# Patient Record
Sex: Female | Born: 1945 | Race: White | Hispanic: No | Marital: Married | Smoking: Former smoker
Health system: Southern US, Community
[De-identification: ages and names within clinical notes are randomized; demographics above are authoritative.]

## PROBLEM LIST (undated history)

## (undated) DIAGNOSIS — G4730 Sleep apnea, unspecified: Secondary | ICD-10-CM

## (undated) DIAGNOSIS — E785 Hyperlipidemia, unspecified: Secondary | ICD-10-CM

## (undated) DIAGNOSIS — E119 Type 2 diabetes mellitus without complications: Secondary | ICD-10-CM

## (undated) DIAGNOSIS — G20 Parkinson's disease: Secondary | ICD-10-CM

## (undated) DIAGNOSIS — F419 Anxiety disorder, unspecified: Secondary | ICD-10-CM

## (undated) DIAGNOSIS — N183 Chronic kidney disease, stage 3 unspecified: Secondary | ICD-10-CM

## (undated) DIAGNOSIS — I4819 Other persistent atrial fibrillation: Secondary | ICD-10-CM

## (undated) DIAGNOSIS — J449 Chronic obstructive pulmonary disease, unspecified: Secondary | ICD-10-CM

## (undated) DIAGNOSIS — I10 Essential (primary) hypertension: Secondary | ICD-10-CM

## (undated) DIAGNOSIS — I5032 Chronic diastolic (congestive) heart failure: Secondary | ICD-10-CM

## (undated) DIAGNOSIS — G20A1 Parkinson's disease without dyskinesia, without mention of fluctuations: Secondary | ICD-10-CM

## (undated) DIAGNOSIS — J189 Pneumonia, unspecified organism: Secondary | ICD-10-CM

## (undated) DIAGNOSIS — I509 Heart failure, unspecified: Secondary | ICD-10-CM

## (undated) HISTORY — PX: TUBAL LIGATION: SHX77

## (undated) HISTORY — DX: Essential (primary) hypertension: I10

## (undated) HISTORY — DX: Anxiety disorder, unspecified: F41.9

## (undated) HISTORY — DX: Hyperlipidemia, unspecified: E78.5

## (undated) HISTORY — PX: ABDOMINAL HYSTERECTOMY: SHX81

---

## 1997-12-28 ENCOUNTER — Encounter: Admission: RE | Admit: 1997-12-28 | Discharge: 1998-03-28 | Payer: Self-pay | Admitting: Internal Medicine

## 1999-05-03 ENCOUNTER — Encounter: Payer: Self-pay | Admitting: Internal Medicine

## 1999-05-03 ENCOUNTER — Encounter: Admission: RE | Admit: 1999-05-03 | Discharge: 1999-05-03 | Payer: Self-pay | Admitting: Internal Medicine

## 2000-05-07 ENCOUNTER — Encounter: Admission: RE | Admit: 2000-05-07 | Discharge: 2000-05-07 | Payer: Self-pay | Admitting: Internal Medicine

## 2000-05-07 ENCOUNTER — Encounter: Payer: Self-pay | Admitting: Internal Medicine

## 2000-08-10 ENCOUNTER — Encounter: Payer: Self-pay | Admitting: Internal Medicine

## 2000-08-10 ENCOUNTER — Encounter: Admission: RE | Admit: 2000-08-10 | Discharge: 2000-08-10 | Payer: Self-pay | Admitting: Internal Medicine

## 2000-09-28 ENCOUNTER — Inpatient Hospital Stay (HOSPITAL_COMMUNITY): Admission: EM | Admit: 2000-09-28 | Discharge: 2000-09-30 | Payer: Self-pay | Admitting: General Surgery

## 2000-09-28 ENCOUNTER — Encounter: Payer: Self-pay | Admitting: General Surgery

## 2000-09-28 ENCOUNTER — Encounter (INDEPENDENT_AMBULATORY_CARE_PROVIDER_SITE_OTHER): Payer: Self-pay | Admitting: *Deleted

## 2000-09-28 ENCOUNTER — Encounter: Admission: RE | Admit: 2000-09-28 | Discharge: 2000-09-28 | Payer: Self-pay | Admitting: *Deleted

## 2000-09-28 ENCOUNTER — Encounter: Payer: Self-pay | Admitting: Internal Medicine

## 2000-09-29 ENCOUNTER — Encounter: Payer: Self-pay | Admitting: General Surgery

## 2001-08-16 ENCOUNTER — Encounter: Admission: RE | Admit: 2001-08-16 | Discharge: 2001-08-16 | Payer: Self-pay | Admitting: Internal Medicine

## 2001-08-16 ENCOUNTER — Encounter: Payer: Self-pay | Admitting: Internal Medicine

## 2003-02-06 ENCOUNTER — Encounter: Admission: RE | Admit: 2003-02-06 | Discharge: 2003-02-06 | Payer: Self-pay | Admitting: Internal Medicine

## 2004-02-24 ENCOUNTER — Ambulatory Visit (HOSPITAL_COMMUNITY): Admission: RE | Admit: 2004-02-24 | Discharge: 2004-02-24 | Payer: Self-pay | Admitting: Internal Medicine

## 2004-02-26 ENCOUNTER — Ambulatory Visit (HOSPITAL_BASED_OUTPATIENT_CLINIC_OR_DEPARTMENT_OTHER): Admission: RE | Admit: 2004-02-26 | Discharge: 2004-02-26 | Payer: Self-pay | Admitting: Internal Medicine

## 2004-04-15 ENCOUNTER — Ambulatory Visit (HOSPITAL_BASED_OUTPATIENT_CLINIC_OR_DEPARTMENT_OTHER): Admission: RE | Admit: 2004-04-15 | Discharge: 2004-04-15 | Payer: Self-pay | Admitting: Internal Medicine

## 2006-08-20 ENCOUNTER — Ambulatory Visit (HOSPITAL_COMMUNITY): Admission: RE | Admit: 2006-08-20 | Discharge: 2006-08-20 | Payer: Self-pay | Admitting: Internal Medicine

## 2007-11-06 ENCOUNTER — Ambulatory Visit (HOSPITAL_COMMUNITY): Admission: RE | Admit: 2007-11-06 | Discharge: 2007-11-06 | Payer: Self-pay | Admitting: Family Medicine

## 2008-11-09 ENCOUNTER — Ambulatory Visit (HOSPITAL_COMMUNITY): Admission: RE | Admit: 2008-11-09 | Discharge: 2008-11-09 | Payer: Self-pay | Admitting: Family Medicine

## 2010-03-30 ENCOUNTER — Ambulatory Visit (HOSPITAL_COMMUNITY)
Admission: RE | Admit: 2010-03-30 | Discharge: 2010-03-30 | Payer: Self-pay | Source: Home / Self Care | Attending: Family Medicine | Admitting: Family Medicine

## 2010-04-06 ENCOUNTER — Encounter
Admission: RE | Admit: 2010-04-06 | Discharge: 2010-04-06 | Payer: Self-pay | Source: Home / Self Care | Attending: Family Medicine | Admitting: Family Medicine

## 2010-04-10 ENCOUNTER — Encounter: Payer: Self-pay | Admitting: Family Medicine

## 2010-04-10 ENCOUNTER — Encounter: Payer: Self-pay | Admitting: Internal Medicine

## 2010-04-20 ENCOUNTER — Encounter: Payer: Self-pay | Admitting: Family Medicine

## 2010-08-05 NOTE — Op Note (Signed)
[facility]  Patient:    Melissa Powers, Melissa Powers                         MRN: [id number] Proc. Date: 09/29/00 Adm. Date:  [id number] Attending:  Arlis Porta CC:         Thora Lance, M.D.   Operative Report  PREOPERATIVE DIAGNOSIS:  Acute cholecystitis.  POSTOPERATIVE DIAGNOSIS:  Acute calculus cholecystitis.  PROCEDURE:  Laparoscopic cholecystectomy with intraoperative cholangiogram.  SURGEON:  Adolph Pollack, M.D.  ASSISTANT:  Anselm Pancoast. Zachery Dakins, M.D.  ANESTHESIA:  General.  INDICATIONS AND FINDINGS:  This is a 65 year old female, who has had a three day history of progressively increasing right upper quadrant pain with fever. Ultrasound demonstrated multiple gallstones.  Although her white blood cell count and liver functions were normal, her history and exam were suspicious for acute cholecystitis.  During the operation, she was found to have early acute inflammatory changes as well as edematous gallbladder wall.  TECHNIQUE:  She was placed supine on the operating table, and a general anesthetic was administered.  The abdomen was sterilely prepped and draped. Marcaine 0.5% plain was infiltrated in the subumbilical region, and a subumbilical incision made in the skin and subcutaneous tissue down to the fascia.  A 1 cm incision was made in the midline fascia.  The peritoneum was grasped and incised sharply.  The peritoneal was then entered under direct vision.  A pursestring suture of 0 Vicryl was placed around the fascial edges. A Hasson trocar was introduced into the peritoneal cavity and pneumoperitoneum created by insufflation of C02 gas.  Next, the laparoscope was introduced, and I saw no evidence of bowel injury. The patient was then placed in the appropriate position, and an 11 mm trocar placed through a similar size incision in the epigastrium and two 5 mm trocars placed in the right mid abdomen.  The fundus of the gallbladder  was grasped, and I noted some early acute inflammatory changes as well as some adhesions between the omentum and the gallbladder which were taken down bluntly and with cautery.  We grasped the infundibulum and completely mobilized it.  There was an anterior branch of the cystic artery laying anterior to the cystic duct. This was clipped and divided.  The cystic duct was subsequently isolated at its junction with the gallbladder and clipped at the junction of the gallbladder.  An incision was made in the cystic duct.  A Cholangiocath catheter was passed through the anterior abdominal wall, and a cholangiogram was performed.  Under real-time fluoroscopy, contrast solution was injected into the cystic duct.  It quickly passed into the common bile duct and into the duodenum.  The common hepatic and right and left hepatic ducts were noted.  There were no obvious filling defects.  The final report is pending the radiologists interpretation.  The Cholangiocath catheter was then removed.  The cystic duct was clipped three times proximally and divided.  A posterior branch of the cystic artery was identified, clipped, and divided.  The gallbladder was then dissected free from the liver bed intact with the cautery.  Bleeding points were controlled with the cautery.  The perihepatic areas were then copiously irrigated.  The gallbladder was removed from the subumbilical port.  There was some leakage of bile from the gallbladder in the subumbilical region, and this was irrigated and evacuated.  Under direct vision, I next closed the subumbilical fascial defect by tightening up  and tying down the pursestring suture.  There was still some leakage from the peritoneal edge, so I added an extra 0 Vicryl suture placed by way of an endoclose device.  This allowed for closure of the peritoneum and hemostasis.  The rest of the irrigation fluid was evacuated, and then all of the trocars were removed, and  the pneumoperitoneum was released.  The skin was closed with 4-0 Monocryl subcuticular stitches followed by Steri-Strips and sterile dressings.  She tolerated the procedure well without any apparent complications and was taken to the recovery room in satisfactory condition. DD:  09/29/00 TD:  09/29/00 Job: [id number]

## 2010-08-05 NOTE — H&P (Signed)
[facility]  Patient:    Melissa Powers, Melissa Powers                         MRN: [id number] Adm. Date:  [id number] Attending:  Arlis Porta CC:         Thora Lance, M.D.   History and Physical  REASON FOR VISIT:  Right upper quadrant abdominal pain, fever, gallstones.  HISTORY OF PRESENT ILLNESS:  Melissa Powers is a 65 year old female with type 2 diabetes who on Tuesday night began having some right upper quadrant pain she described as a catch that was fairly significant. On Wednesday she began vomiting. The pain has been relatively constant progressively worsening and made worse especially by meals. She had eaten some sausage this morning and had acute exacerbation of the pain leaving her to go to see Dr. Valentina Lucks. While there she had some liver function tests drawn which were normal and a complete blood cell count which was normal; however, she did undergo an ultrasound of her abdomen which demonstrated multiple gallstones. The pain has been fairly persistent and severe. She has not had any jaundice or hepatitis. She has persistent nausea as well as the pain.  PAST MEDICAL HISTORY: 1. Type 2 diabetes mellitus. 2. Hypertension. 3. Obesity. 4. Depression.  PAST SURGICAL HISTORY: 1. Left breast biopsy x 2 in 1992 and 1995 both of which were benign. 2. Total abdominal hysterectomy without oophorectomy for dysfunctional    uterine bleeding.  ALLERGIES:  None reported.  MEDICATIONS:  Zestoretic 20/25 one q.d., glucophage 1000 mg b.i.d., Aleve 1 b.i.d. to t.i.d., aspirin 81 mg q.d. She used to take serzone but takes an antidepressant, the name of which she does not know, one a day.  SOCIAL HISTORY:  She is married. She rarely has an alcoholic beverage, denies smoking.  FAMILY HISTORY:  Positive for diabetes and hypertension throughout the family. She has a brother and sister both of whom have heart disease as well. She has a brother that has  gallbladder disease.  REVIEW OF SYSTEMS:  CARDIOVASCULAR:  She denies known heart disease. She did have some atypical chest pain and underwent a cardiac catheterization back in 1996 by Dr. Ty Hilts which was normal. PULMONARY:  No asthma, pneumonia or COPD. GI:  No peptic ulcer disease, hiatal hernia, reflux or diverticulitis. She has had some diarrhea since Tuesday. GU:  No kidney stones, dysuria, or hematuria. HEMATOLOGIC:  She denies blood transfusions although it has been reported in her history and physical note by Dr. Valentina Lucks that she had one back in 1980. No deep venous thrombosis or known bleeding disorders. MUSCULOSKELETAL:  She said she has bursitis and tendonitis in the right shoulder.  PHYSICAL EXAMINATION:  GENERAL:  An obese female in no acute distress. Pleasant and cooperative. She weighs approximately 222 pounds and is 5 feet 3 3/4 inches tall.  HEENT:  Eyes extraocular movements intact. No scleral icterus.  NECK:  Supple without palpable masses.  CARDIOVASCULAR:  Heart demonstrates regular rate and rhythm without murmur. She does have palpable pedal pulses.  RESPIRATORY:  Breath sounds equal and clear. Respirations unlabored.  ABDOMEN:  Soft. There is mild right upper quadrant tenderness to palpation but no Murphy sign at the present. No palpable masses. No organomegaly.  MUSCULOSKELETAL:  She has full range of motion of her extremities with no cyanosis or edema.  IMPRESSION:  Persistent right upper quadrant pain worsening with fever and nausea. After morphine, she is  feeling some better. Given the fact she has diabetes and has a long history of this pain as well as gallstones, I am concerned that she has acute cholecystitis.  PLAN:  Admission to the hospital and start on IV antibiotics. Will plan urgent laparoscopic cholecystectomy tomorrow morning. I did explain the procedure and the risks including but not limited to bleeding, infection, risks  of anesthesia, hepatic injury, bile leakage, common bile duct damage, and small intestinal injury. She and her husband seem to understand this and agree to proceed. We will cover her diabetes with sliding scale insulin and cover her hypertension with p.r.n. labetalol. DD:  09/28/00 TD:  09/29/00 Job: [id number]

## 2010-08-05 NOTE — Procedures (Signed)
Melissa Powers, Melissa Powers                  ACCOUNT NO.:  [id number]   MEDICAL RECORD NO.:  [id number]          PATIENT TYPE:  OUT   LOCATION:  SLEEP CENTER                 FACILITY:  [facility]   PHYSICIAN:  Clinton D. Maple Hudson, M.D. DATE OF BIRTH:  December 17, 1945   DATE OF STUDY:  02/26/2004                              NOCTURNAL POLYSOMNOGRAM   STUDY DATE:  February 26, 2004   REFERRING PHYSICIAN:  Dr. Kirby Funk   INDICATION FOR STUDY:  Hypersomnia with sleep apnea.   EPWORTH SLEEPINESS SCORE:  20/24   BODY MASS INDEX:  31   WEIGHT:  194 pounds   SLEEP ARCHITECTURE:  Short total sleep time 221 minutes with sleep  efficiency 49%.  Stage I was 29%, stage II 69%, stages III and IV 1%, REM  was absent.  Sleep latency was 66 minutes, awake after sleep onset 164  minutes, arousal index elevated at 76.8.   RESPIRATORY DATA:  RDI 37.5 obstructive events per hour which is consistent  with moderately severe obstructive sleep apnea/hypopnea syndrome.  This  included 20 obstructive apneas and 118 hypopneas.  Events were not  positional.  REM RDI N/A.  The technician could not utilize split-study  protocol for CPAP titration because the patient could not sustain sleep  sufficiently to enable the protocol.  Consider return for CPAP titration.   OXYGEN DATA:  Moderately loud snoring with oxygen desaturation to a nadir of  87%.  Mean oxygen saturation through the study was 95% on room air.   CARDIAC DATA:  Sinus rhythm with frequent PVCs.   MOVEMENT/PARASOMNIA:  A total of 29 limb jerks were recorded of which 9 were  associated with arousal or awakening for a periodic limb movement with  arousal index of 2.4/hr which is mildly increased.   IMPRESSION/RECOMMENDATION:  Moderately severe obstructive sleep  apnea/hypopnea syndrome, RDI 37.5/hr with desaturation to 87%.  She was  unable to maintain sleep to permit the split-study protocol.  Consider  return for continuous positive airway pressure titration  bringing a sleep  medication to assist with sleep consolidation.  Mild periodic limb movement  with arousal, 2.4/hr.                                                           Clinton D. Maple Hudson, M.D.  Diplomate, [employer] Board   CDY/MEDQ  D:  03/06/2004 10:16:28  T:  03/06/2004 21:29:56  Job:  [id number]

## 2010-08-05 NOTE — Procedures (Signed)
Melissa Powers, Melissa Powers                  ACCOUNT NO.:  [id number]   MEDICAL RECORD NO.:  [id number]          PATIENT TYPE:  OUT   LOCATION:  SLEEP CENTER                 FACILITY:  [facility]   PHYSICIAN:  Clinton D. Maple Hudson, M.D. DATE OF BIRTH:  05-21-1945   DATE OF STUDY:                              NOCTURNAL POLYSOMNOGRAM   INDICATIONS FOR STUDY:  Hypersomnia with sleep apnea. Epworth sleepiness  score 20/24, BMI 33, weight 200 pounds. Diagnostic NPSG February 26, 2004,  recorded RDI 37.5 per hour. CPAP titration was requested.   SLEEP ARCHITECTURE:  Total sleep time 393 minutes with sleep efficiency of  81%. Stage I was 6%, stage II was 65%, stages III and IV were 19%, REM was  9% of total sleep time. Latency to sleep onset 54 minutes. Latency to REM  247 minutes. Awake after sleep onset 30 minutes. Arousal index 71, which is  increased.  The patient took Ambien.   RESPIRATORY DATA:  CPAP titration protocol. CPAP was titrated to 18 CWP, RDI  14 per hour using a small Resmed Ultra Mirage nasal/oral mask. It appears  the technician ran out of time and better control might be available at 19  CWP if necessary.   OXYGEN DATA:  Snoring was eliminated at a CPAP of 10 CWP. Oxygen saturation  averaged 95% on room air.   CARDIAC DATA:  Normal sinus rhythm, sinus tachycardia, 101 to 102 per minute  with occasional PVCs.   MOVEMENT/PARASOMNIA:  A total of 337 limb jerks were recorded of which 274  were associated with arousal or awakening for a periodic limb movement with  arousal index of 41.8 per hour which is markedly increased.   IMPRESSION/RECOMMENDATIONS:  1.  CPAP titration to 18 CWP, respiratory disturbance index of 14 per hour      using a small Resmed Ultra Mirage nasal/oral mask with heated      humidifier. This controlled snoring, but left a few residual hypopneas.      If necessary pressure can be tried at 19 or 20 CWP, but suggest      initiation at home setting of 18 CWP.  2.   Periodic limb movement with arousal, 41.8 per hour. If this persists      after adjustment to CPAP at home, consider addition of clonazepam or      Requip if clinically appropriate.      CDY/MEDQ  D:  04/17/2004 13:21:37  T:  04/17/2004 20:24:56  Job:  [id number]

## 2011-05-10 ENCOUNTER — Other Ambulatory Visit (HOSPITAL_COMMUNITY): Payer: Self-pay | Admitting: Family Medicine

## 2011-05-10 DIAGNOSIS — Z1231 Encounter for screening mammogram for malignant neoplasm of breast: Secondary | ICD-10-CM

## 2011-05-19 ENCOUNTER — Ambulatory Visit (HOSPITAL_COMMUNITY)
Admission: RE | Admit: 2011-05-19 | Discharge: 2011-05-19 | Disposition: A | Discharge status: Home / Self Care | Payer: Medicare Other | Source: Ambulatory Visit | Attending: Family Medicine | Admitting: Family Medicine

## 2011-05-19 DIAGNOSIS — Z1231 Encounter for screening mammogram for malignant neoplasm of breast: Secondary | ICD-10-CM

## 2012-06-14 ENCOUNTER — Other Ambulatory Visit (HOSPITAL_COMMUNITY): Payer: Self-pay | Admitting: Family Medicine

## 2012-06-14 DIAGNOSIS — Z1231 Encounter for screening mammogram for malignant neoplasm of breast: Secondary | ICD-10-CM

## 2012-06-20 ENCOUNTER — Ambulatory Visit (HOSPITAL_COMMUNITY): Payer: Medicare Other

## 2012-07-19 ENCOUNTER — Ambulatory Visit (HOSPITAL_COMMUNITY)
Admission: RE | Admit: 2012-07-19 | Discharge: 2012-07-19 | Disposition: A | Discharge status: Home / Self Care | Payer: Medicare Other | Source: Ambulatory Visit | Attending: Family Medicine | Admitting: Family Medicine

## 2012-07-19 DIAGNOSIS — Z1231 Encounter for screening mammogram for malignant neoplasm of breast: Secondary | ICD-10-CM | POA: Insufficient documentation

## 2013-07-21 ENCOUNTER — Other Ambulatory Visit (HOSPITAL_COMMUNITY): Payer: Self-pay | Admitting: Family Medicine

## 2013-07-21 DIAGNOSIS — Z1231 Encounter for screening mammogram for malignant neoplasm of breast: Secondary | ICD-10-CM

## 2013-08-08 ENCOUNTER — Encounter (INDEPENDENT_AMBULATORY_CARE_PROVIDER_SITE_OTHER): Payer: Self-pay

## 2013-08-08 ENCOUNTER — Ambulatory Visit (HOSPITAL_COMMUNITY)
Admission: RE | Admit: 2013-08-08 | Discharge: 2013-08-08 | Disposition: A | Discharge status: Home / Self Care | Payer: Medicare Other | Source: Ambulatory Visit | Attending: Family Medicine | Admitting: Family Medicine

## 2013-08-08 DIAGNOSIS — Z1231 Encounter for screening mammogram for malignant neoplasm of breast: Secondary | ICD-10-CM

## 2014-03-26 ENCOUNTER — Encounter: Payer: Self-pay | Admitting: Neurology

## 2015-01-19 HISTORY — PX: DEEP BRAIN STIMULATOR PLACEMENT: SHX608

## 2015-08-05 ENCOUNTER — Other Ambulatory Visit (HOSPITAL_COMMUNITY): Payer: Self-pay | Admitting: *Deleted

## 2015-08-19 DIAGNOSIS — J189 Pneumonia, unspecified organism: Secondary | ICD-10-CM

## 2015-08-19 HISTORY — DX: Pneumonia, unspecified organism: J18.9

## 2015-10-02 DIAGNOSIS — E119 Type 2 diabetes mellitus without complications: Secondary | ICD-10-CM

## 2015-10-02 DIAGNOSIS — E875 Hyperkalemia: Secondary | ICD-10-CM

## 2015-10-02 DIAGNOSIS — J9611 Chronic respiratory failure with hypoxia: Secondary | ICD-10-CM

## 2015-10-02 DIAGNOSIS — I10 Essential (primary) hypertension: Secondary | ICD-10-CM

## 2015-10-02 DIAGNOSIS — F418 Other specified anxiety disorders: Secondary | ICD-10-CM

## 2015-10-02 DIAGNOSIS — I480 Paroxysmal atrial fibrillation: Secondary | ICD-10-CM

## 2015-10-02 DIAGNOSIS — N189 Chronic kidney disease, unspecified: Secondary | ICD-10-CM

## 2015-10-02 DIAGNOSIS — I4891 Unspecified atrial fibrillation: Secondary | ICD-10-CM

## 2015-10-02 DIAGNOSIS — Z7901 Long term (current) use of anticoagulants: Secondary | ICD-10-CM

## 2015-10-02 DIAGNOSIS — G20 Parkinson's disease: Secondary | ICD-10-CM

## 2015-10-03 DIAGNOSIS — Z7901 Long term (current) use of anticoagulants: Secondary | ICD-10-CM | POA: Diagnosis not present

## 2015-10-03 DIAGNOSIS — J9611 Chronic respiratory failure with hypoxia: Secondary | ICD-10-CM | POA: Diagnosis not present

## 2015-10-03 DIAGNOSIS — I4891 Unspecified atrial fibrillation: Secondary | ICD-10-CM | POA: Diagnosis not present

## 2015-10-03 DIAGNOSIS — N189 Chronic kidney disease, unspecified: Secondary | ICD-10-CM | POA: Diagnosis not present

## 2015-10-14 ENCOUNTER — Other Ambulatory Visit: Payer: Self-pay | Admitting: Internal Medicine

## 2015-10-19 ENCOUNTER — Encounter (HOSPITAL_COMMUNITY): Payer: Self-pay | Admitting: Emergency Medicine

## 2015-10-19 ENCOUNTER — Emergency Department (HOSPITAL_COMMUNITY): Payer: Medicare Other

## 2015-10-19 ENCOUNTER — Inpatient Hospital Stay (HOSPITAL_COMMUNITY): Payer: Medicare Other

## 2015-10-19 ENCOUNTER — Inpatient Hospital Stay (HOSPITAL_COMMUNITY)
Admission: EM | Admit: 2015-10-19 | Discharge: 2015-10-22 | DRG: 291 | Disposition: A | Discharge status: Skilled Nursing Facility | Payer: Medicare Other | Attending: Family Medicine | Admitting: Family Medicine

## 2015-10-19 DIAGNOSIS — I130 Hypertensive heart and chronic kidney disease with heart failure and stage 1 through stage 4 chronic kidney disease, or unspecified chronic kidney disease: Secondary | ICD-10-CM | POA: Diagnosis present

## 2015-10-19 DIAGNOSIS — M797 Fibromyalgia: Secondary | ICD-10-CM | POA: Diagnosis present

## 2015-10-19 DIAGNOSIS — F419 Anxiety disorder, unspecified: Secondary | ICD-10-CM | POA: Diagnosis present

## 2015-10-19 DIAGNOSIS — Z7901 Long term (current) use of anticoagulants: Secondary | ICD-10-CM

## 2015-10-19 DIAGNOSIS — R001 Bradycardia, unspecified: Secondary | ICD-10-CM | POA: Diagnosis not present

## 2015-10-19 DIAGNOSIS — E785 Hyperlipidemia, unspecified: Secondary | ICD-10-CM | POA: Diagnosis present

## 2015-10-19 DIAGNOSIS — E1140 Type 2 diabetes mellitus with diabetic neuropathy, unspecified: Secondary | ICD-10-CM | POA: Diagnosis present

## 2015-10-19 DIAGNOSIS — G4730 Sleep apnea, unspecified: Secondary | ICD-10-CM | POA: Diagnosis present

## 2015-10-19 DIAGNOSIS — E1122 Type 2 diabetes mellitus with diabetic chronic kidney disease: Secondary | ICD-10-CM | POA: Diagnosis present

## 2015-10-19 DIAGNOSIS — Z9981 Dependence on supplemental oxygen: Secondary | ICD-10-CM | POA: Diagnosis not present

## 2015-10-19 DIAGNOSIS — I5033 Acute on chronic diastolic (congestive) heart failure: Secondary | ICD-10-CM | POA: Diagnosis present

## 2015-10-19 DIAGNOSIS — N189 Chronic kidney disease, unspecified: Secondary | ICD-10-CM

## 2015-10-19 DIAGNOSIS — I447 Left bundle-branch block, unspecified: Secondary | ICD-10-CM | POA: Diagnosis present

## 2015-10-19 DIAGNOSIS — Z87891 Personal history of nicotine dependence: Secondary | ICD-10-CM | POA: Diagnosis not present

## 2015-10-19 DIAGNOSIS — N390 Urinary tract infection, site not specified: Secondary | ICD-10-CM | POA: Diagnosis present

## 2015-10-19 DIAGNOSIS — I10 Essential (primary) hypertension: Secondary | ICD-10-CM | POA: Diagnosis not present

## 2015-10-19 DIAGNOSIS — I509 Heart failure, unspecified: Secondary | ICD-10-CM

## 2015-10-19 DIAGNOSIS — J449 Chronic obstructive pulmonary disease, unspecified: Secondary | ICD-10-CM | POA: Diagnosis present

## 2015-10-19 DIAGNOSIS — G20 Parkinson's disease: Secondary | ICD-10-CM | POA: Diagnosis present

## 2015-10-19 DIAGNOSIS — R32 Unspecified urinary incontinence: Secondary | ICD-10-CM | POA: Diagnosis present

## 2015-10-19 DIAGNOSIS — N183 Chronic kidney disease, stage 3 (moderate): Secondary | ICD-10-CM | POA: Diagnosis present

## 2015-10-19 DIAGNOSIS — R0602 Shortness of breath: Secondary | ICD-10-CM | POA: Diagnosis present

## 2015-10-19 DIAGNOSIS — I480 Paroxysmal atrial fibrillation: Secondary | ICD-10-CM | POA: Diagnosis present

## 2015-10-19 DIAGNOSIS — J9621 Acute and chronic respiratory failure with hypoxia: Secondary | ICD-10-CM | POA: Diagnosis not present

## 2015-10-19 DIAGNOSIS — N179 Acute kidney failure, unspecified: Secondary | ICD-10-CM | POA: Diagnosis present

## 2015-10-19 DIAGNOSIS — J9620 Acute and chronic respiratory failure, unspecified whether with hypoxia or hypercapnia: Secondary | ICD-10-CM | POA: Diagnosis present

## 2015-10-19 DIAGNOSIS — W19XXXA Unspecified fall, initial encounter: Secondary | ICD-10-CM | POA: Diagnosis not present

## 2015-10-19 HISTORY — DX: Chronic kidney disease, stage 3 unspecified: N18.30

## 2015-10-19 HISTORY — DX: Sleep apnea, unspecified: G47.30

## 2015-10-19 HISTORY — DX: Heart failure, unspecified: I50.9

## 2015-10-19 HISTORY — DX: Chronic kidney disease, stage 3 (moderate): N18.3

## 2015-10-19 HISTORY — DX: Pneumonia, unspecified organism: J18.9

## 2015-10-19 HISTORY — DX: Type 2 diabetes mellitus without complications: E11.9

## 2015-10-19 HISTORY — DX: Chronic obstructive pulmonary disease, unspecified: J44.9

## 2015-10-19 LAB — GLUCOSE, CAPILLARY
Glucose-Capillary: 223 mg/dL — ABNORMAL HIGH (ref 65–99)
Glucose-Capillary: 170 mg/dL — ABNORMAL HIGH (ref 65–99)

## 2015-10-19 LAB — CBC WITH DIFFERENTIAL/PLATELET
BASOS ABS: 0 10*3/uL (ref 0.0–0.1)
BASOS PCT: 0 %
EOS ABS: 0 10*3/uL (ref 0.0–0.7)
Eosinophils Relative: 0 %
HCT: 29.4 % — ABNORMAL LOW (ref 36.0–46.0)
HEMOGLOBIN: 8.9 g/dL — AB (ref 12.0–15.0)
Lymphocytes Relative: 12 %
Lymphs Abs: 1 10*3/uL (ref 0.7–4.0)
MCH: 27.6 pg (ref 26.0–34.0)
MCHC: 30.3 g/dL (ref 30.0–36.0)
MCV: 91.3 fL (ref 78.0–100.0)
MONO ABS: 0.7 10*3/uL (ref 0.1–1.0)
MONOS PCT: 8 %
NEUTROS PCT: 80 %
Neutro Abs: 6.7 10*3/uL (ref 1.7–7.7)
Platelets: 236 10*3/uL (ref 150–400)
RBC: 3.22 MIL/uL — ABNORMAL LOW (ref 3.87–5.11)
RDW: 14.8 % (ref 11.5–15.5)
WBC: 8.5 10*3/uL (ref 4.0–10.5)

## 2015-10-19 LAB — COMPREHENSIVE METABOLIC PANEL
ALBUMIN: 3.9 g/dL (ref 3.5–5.0)
ALT: 13 U/L — ABNORMAL LOW (ref 14–54)
ANION GAP: 9 (ref 5–15)
AST: 21 U/L (ref 15–41)
Alkaline Phosphatase: 32 U/L — ABNORMAL LOW (ref 38–126)
BUN: 77 mg/dL — AB (ref 6–20)
CO2: 28 mmol/L (ref 22–32)
Calcium: 9.5 mg/dL (ref 8.9–10.3)
Chloride: 98 mmol/L — ABNORMAL LOW (ref 101–111)
Creatinine, Ser: 2.21 mg/dL — ABNORMAL HIGH (ref 0.44–1.00)
GFR calc Af Amer: 25 mL/min — ABNORMAL LOW (ref >60)
GFR calc non Af Amer: 21 mL/min — ABNORMAL LOW (ref >60)
GLUCOSE: 149 mg/dL — AB (ref 65–99)
POTASSIUM: 4.1 mmol/L (ref 3.5–5.1)
SODIUM: 135 mmol/L (ref 135–145)
Total Bilirubin: 0.8 mg/dL (ref 0.3–1.2)
Total Protein: 7.4 g/dL (ref 6.5–8.1)

## 2015-10-19 LAB — URINE MICROSCOPIC-ADD ON

## 2015-10-19 LAB — URINALYSIS, ROUTINE W REFLEX MICROSCOPIC
BILIRUBIN URINE: NEGATIVE
Glucose, UA: NEGATIVE mg/dL
HGB URINE DIPSTICK: NEGATIVE
KETONES UR: NEGATIVE mg/dL
Nitrite: POSITIVE — AB
Protein, ur: 30 mg/dL — AB
Specific Gravity, Urine: 1.014 (ref 1.005–1.030)
pH: 6 (ref 5.0–8.0)

## 2015-10-19 LAB — TSH: TSH: 2.157 u[IU]/mL (ref 0.350–4.500)

## 2015-10-19 LAB — I-STAT TROPONIN, ED: TROPONIN I, POC: 0.01 ng/mL (ref 0.00–0.08)

## 2015-10-19 LAB — BRAIN NATRIURETIC PEPTIDE: B NATRIURETIC PEPTIDE 5: 1057.9 pg/mL — AB (ref 0.0–100.0)

## 2015-10-19 MED ORDER — RIVAROXABAN 15 MG PO TABS
15.0000 mg | ORAL_TABLET | Freq: Every day | ORAL | Status: DC
  Administered 2015-10-19 – 2015-10-22 (×4): 15 mg via ORAL
  Filled 2015-10-19 (×4): qty 1

## 2015-10-19 MED ORDER — SODIUM CHLORIDE 0.9% FLUSH
3.0000 mL | Freq: Two times a day (BID) | INTRAVENOUS | Status: DC
  Administered 2015-10-19 – 2015-10-22 (×5): 3 mL via INTRAVENOUS

## 2015-10-19 MED ORDER — CITALOPRAM HYDROBROMIDE 20 MG PO TABS
20.0000 mg | ORAL_TABLET | Freq: Every day | ORAL | Status: DC
  Administered 2015-10-19 – 2015-10-22 (×4): 20 mg via ORAL
  Filled 2015-10-19 (×4): qty 1

## 2015-10-19 MED ORDER — IRBESARTAN 300 MG PO TABS
300.0000 mg | ORAL_TABLET | Freq: Every day | ORAL | Status: DC
  Administered 2015-10-19 – 2015-10-22 (×4): 300 mg via ORAL
  Filled 2015-10-19 (×4): qty 1

## 2015-10-19 MED ORDER — SODIUM CHLORIDE 0.9% FLUSH: 3.0000 mL | INTRAVENOUS | Status: DC | PRN

## 2015-10-19 MED ORDER — SODIUM CHLORIDE 0.9 % IV SOLN: 250.0000 mL | INTRAVENOUS | Status: DC | PRN

## 2015-10-19 MED ORDER — ATORVASTATIN CALCIUM 20 MG PO TABS
20.0000 mg | ORAL_TABLET | Freq: Every day | ORAL | Status: DC
  Administered 2015-10-19 – 2015-10-22 (×4): 20 mg via ORAL
  Filled 2015-10-19 (×4): qty 1

## 2015-10-19 MED ORDER — HYDRALAZINE HCL 25 MG PO TABS
25.0000 mg | ORAL_TABLET | Freq: Three times a day (TID) | ORAL | Status: DC
  Administered 2015-10-19 – 2015-10-20 (×2): 25 mg via ORAL
  Filled 2015-10-19 (×2): qty 1

## 2015-10-19 MED ORDER — INSULIN ASPART 100 UNIT/ML ~~LOC~~ SOLN
0.0000 [IU] | Freq: Three times a day (TID) | SUBCUTANEOUS | Status: DC
  Administered 2015-10-19: 3 [IU] via SUBCUTANEOUS
  Administered 2015-10-20: 2 [IU] via SUBCUTANEOUS
  Administered 2015-10-20 – 2015-10-21 (×4): 1 [IU] via SUBCUTANEOUS

## 2015-10-19 MED ORDER — CARBIDOPA-LEVODOPA 25-100 MG PO TABS
1.0000 | ORAL_TABLET | Freq: Three times a day (TID) | ORAL | Status: DC
  Administered 2015-10-19 – 2015-10-22 (×10): 1 via ORAL
  Filled 2015-10-19 (×10): qty 1

## 2015-10-19 MED ORDER — METOPROLOL SUCCINATE ER 25 MG PO TB24
25.0000 mg | ORAL_TABLET | Freq: Every day | ORAL | Status: DC
  Administered 2015-10-19: 25 mg via ORAL
  Filled 2015-10-19: qty 1

## 2015-10-19 MED ORDER — ROPINIROLE HCL 0.25 MG PO TABS
0.2500 mg | ORAL_TABLET | Freq: Three times a day (TID) | ORAL | Status: DC
  Administered 2015-10-19 – 2015-10-22 (×10): 0.25 mg via ORAL
  Filled 2015-10-19 (×11): qty 1

## 2015-10-19 MED ORDER — GABAPENTIN 300 MG PO CAPS
300.0000 mg | ORAL_CAPSULE | Freq: Three times a day (TID) | ORAL | Status: DC
  Administered 2015-10-19 – 2015-10-20 (×3): 300 mg via ORAL
  Filled 2015-10-19 (×3): qty 1

## 2015-10-19 MED ORDER — FUROSEMIDE 10 MG/ML IJ SOLN
40.0000 mg | Freq: Once | INTRAMUSCULAR | Status: AC
  Administered 2015-10-19: 40 mg via INTRAVENOUS
  Filled 2015-10-19: qty 4

## 2015-10-19 MED ORDER — ASPIRIN EC 81 MG PO TBEC
81.0000 mg | DELAYED_RELEASE_TABLET | Freq: Every day | ORAL | Status: DC
  Administered 2015-10-19 – 2015-10-22 (×4): 81 mg via ORAL
  Filled 2015-10-19 (×4): qty 1

## 2015-10-19 MED ORDER — IPRATROPIUM BROMIDE 0.02 % IN SOLN: 0.5000 mg | Freq: Four times a day (QID) | RESPIRATORY_TRACT | Status: DC | PRN

## 2015-10-19 MED ORDER — FUROSEMIDE 10 MG/ML IJ SOLN
40.0000 mg | Freq: Two times a day (BID) | INTRAMUSCULAR | Status: DC
  Administered 2015-10-19 – 2015-10-20 (×2): 40 mg via INTRAVENOUS
  Filled 2015-10-19 (×2): qty 4

## 2015-10-19 MED ORDER — SODIUM CHLORIDE 0.9% FLUSH
3.0000 mL | Freq: Two times a day (BID) | INTRAVENOUS | Status: DC
  Administered 2015-10-19 – 2015-10-22 (×5): 3 mL via INTRAVENOUS

## 2015-10-19 MED ORDER — AMIODARONE HCL 200 MG PO TABS: 200.0000 mg | ORAL_TABLET | Freq: Every day | ORAL | Status: DC

## 2015-10-19 MED ORDER — AMIODARONE HCL 200 MG PO TABS: 200.0000 mg | ORAL_TABLET | Freq: Two times a day (BID) | ORAL | Status: DC

## 2015-10-19 MED ORDER — INSULIN ASPART 100 UNIT/ML ~~LOC~~ SOLN: 0.0000 [IU] | Freq: Every day | SUBCUTANEOUS | Status: DC

## 2015-10-19 MED ORDER — AMLODIPINE BESYLATE 5 MG PO TABS
5.0000 mg | ORAL_TABLET | Freq: Every day | ORAL | Status: DC
  Administered 2015-10-19 – 2015-10-22 (×4): 5 mg via ORAL
  Filled 2015-10-19 (×4): qty 1

## 2015-10-19 MED ORDER — FENOFIBRATE 160 MG PO TABS
160.0000 mg | ORAL_TABLET | Freq: Every day | ORAL | Status: DC
  Administered 2015-10-20 – 2015-10-22 (×3): 160 mg via ORAL
  Filled 2015-10-19 (×4): qty 1

## 2015-10-19 MED ORDER — FLECAINIDE ACETATE 100 MG PO TABS
100.0000 mg | ORAL_TABLET | Freq: Two times a day (BID) | ORAL | Status: DC
  Administered 2015-10-19: 100 mg via ORAL
  Filled 2015-10-19 (×2): qty 1

## 2015-10-19 MED ORDER — DILTIAZEM HCL 30 MG PO TABS: 30.0000 mg | ORAL_TABLET | Freq: Four times a day (QID) | ORAL | Status: DC

## 2015-10-19 NOTE — ED Notes (Signed)
Lunch order placed per pts request after MD advised okay for patient to eat

## 2015-10-19 NOTE — H&P (Signed)
[facility] Admission History and Physical Service Pager: [phone]  Patient name: Melissa Powers record number: [id number] Date of birth: [birth year] Age: 70 y.o. Gender: female  Primary Care Provider: No primary care provider on file. Consultants: None Code Status: FULL  Chief Complaint: Shortness of breath  Assessment and Plan: Melissa Powers is a 70 y.o. female presenting with shortness of breath . PMH is significant for HFpEF, Afib, CKD Stage 3, T2DM, HTN, HLD, anxiety, Parkinson's disease, fibromyalgia.  Shortness of breath likely acute on chronic exacerbation. H/o of HFpEF, per care everywhere follows with Dr Melissa Powers with [facility]. On admit BNP 1057, CXR showing cardiac enlargement with +JVD, mild interstitial edema. Last echo in care everywhere 12/22/2014 EF 60-65% with mild concentric LV hypertrophy. Patient states on torsemide at home 40 mg in the AM, 20 mg at lunch time, last took yesterday. - Admit to inpatient, under [facility] - Daily weights, I/Os - Continue IV lasix 40mg  BID - echo  - am EKG - daily BMP - saline lock  Weakness s/p fall Likely d/t SOB vs deconditioning. Cervical and thoracic xrays neg for fracture. CT head no acute intracranial abnormality.  - PT/OT consulted, appreciate recommendations  Paroxysmal Afib at home on ASA81, metoprolol succinate, xarelto, and diltiazem prn if HR >110. At home used to be on amiodarone but per car everywhere note at [facility] recently changed to flecainide on 10/08/15 because not able to afford medication as outpatient - monitor on telemetry - continue ASA81, xarelto, metoprolol - hold diltiazem since currently rate controlled - restarted flecainide per care everywhere notes - start atrovent neb  CKD Stage 3 baseline in care everywhere creatinine 1.1-1.4, on admit creatinine 2.21 - Monitor BMP  T2DM with neuropathy per patient at home on metformin invokana,  gabapentin, sitagliptin. Last A1c in care everywhere 7 at unknown date - SSI - monitor CBGs - A1c pending - continue gabapentin  HTN at home on norvasc, diovan, hydralazine, metoprolol succinate, and diltiazem prn if HR >110 - Monitor BP - continue norvasc, hydralazine, and ARB  HLD at home on atorvastatin and fenofibrate - continue home meds  Anxiety at home on citalopram - continue home med  Parkinson's disease has deep brain stimulator. at home on sinemet, primidone, requip - continue home meds  Fibromyalgia at home on citalopram - continue home med  FEN/GI: SLIV, heart healthy carb modified diet Prophylaxis: on xarelto for Afib  Disposition: pending  History of Present Illness:  Melissa Powers is a 70 y.o. female presenting with shortness of breath. States started 2 weeks ago, usually can ambulate to bathroom w/walker but lately has been finding it difficult to ambulate even short distances and has had increased SOB with speech. Has noticed increased LE edema over the last 2 weeks as well. Has orthopnea at baseline. Usually on home O2 2L at nighttime but recently has needed during the day. States went to [place] 4 days ago for similar complaint, was given lasix and discharged. States her SOB has since worsened. Per EMS was satting in 57s on room air. Denies chest pain. Has minor cough, nonproductive. Denies recent illness, sick contacts, recent increase in salt intake.  States has also had increasing weakness for past 2 weeks, drastically worse over past 2-3 days. Has fallen 4 times over last day. States will be ambulating to the bathroom, will get short of breath and weak, and then will fall. On her last fall she  states she struck the back of her neck and upper back as well as the top of her head.  Review Of Systems: Per HPI otherwise the remainder of the systems were negative.    Past Medical History: Past Medical History:  Diagnosis Date  . Anxiety   . Atrial  fibrillation ([place])   . CHF (congestive heart failure) ([place])   . Chronic kidney disease   . Diabetes ([place])   . Hyperlipidemia   . Hypertension   . Tremor     Past Surgical History: History reviewed. No pertinent surgical history.  Social History: Social History  Substance Use Topics  . Smoking status: Never Smoker  . Smokeless tobacco: Never Used  . Alcohol use No   Additional social history: Lives at home with husband. Former smoker, quit 50 years ago, has 1 pack year smoking history. Denies EtOH, recreational drug use.  Please also refer to relevant sections of EMR.  Family History: Brother has CHF  Allergies and Medications: Allergies  Allergen Reactions  . Morphine And Related Other (See Comments)    Pt reports intolerance due to sedation from morphine   No current facility-administered medications on file prior to encounter.    Current Outpatient Prescriptions on File Prior to Encounter  Medication Sig Dispense Refill  . amiodarone (PACERONE) 200 MG tablet Take 200 mg by mouth daily.    Marland Kitchen amitriptyline (ELAVIL) 100 MG tablet Take 100 mg by mouth at bedtime.    Marland Kitchen amLODipine (NORVASC) 10 MG tablet Take 5 mg by mouth daily.    . busPIRone (BUSPAR) 15 MG tablet Take 15 mg by mouth 2 (two) times daily.    . canagliflozin (INVOKANA) 100 MG TABS tablet Take 100 mg by mouth daily.    . carbidopa-levodopa (SINEMET IR) 25-100 MG per tablet Take 1 tablet by mouth 3 (three) times daily.    . citalopram (CELEXA) 20 MG tablet Take 20 mg by mouth daily.    . digoxin (LANOXIN) 0.125 MG tablet Take by mouth daily.    Marland Kitchen doxazosin (CARDURA) 8 MG tablet Take 8 mg by mouth daily.    . fenofibrate 160 MG tablet Take 160 mg by mouth daily.    Marland Kitchen gabapentin (NEURONTIN) 100 MG capsule Take 100 mg by mouth 3 (three) times daily.    . hydrochlorothiazide (HYDRODIURIL) 25 MG tablet Take 25 mg by mouth daily.    . hydrOXYzine (ATARAX/VISTARIL) 25 MG tablet Take 25 mg by mouth 3 (three) times  daily as needed for anxiety or itching.     . metoprolol succinate (TOPROL-XL) 100 MG 24 hr tablet Take 100 mg by mouth daily. Take with or immediately following a meal.    . Milnacipran HCl (SAVELLA) 100 MG TABS tablet Take 100 mg by mouth 2 (two) times daily.    . primidone (MYSOLINE) 50 MG tablet Take 50 mg by mouth at bedtime.    . Rivaroxaban (XARELTO) 15 MG TABS tablet Take 15 mg by mouth daily with supper.    . sitaGLIPtin (JANUVIA) 100 MG tablet Take 100 mg by mouth daily.    . traZODone (DESYREL) 50 MG tablet Take 50 mg by mouth at bedtime.    . valsartan (DIOVAN) 320 MG tablet Take 320 mg by mouth daily.      Objective: BP 111/67   Pulse 66   Temp 98.6 F (37 C) (Oral)   Resp 17   SpO2 93%  Exam: General: Laying in bed with head propped up at 35  degrees, appears SOB with nasal cannula in, in NAD Eyes: EOMI, PERRL ENTM: MMM Neck: +JVD Cardiovascular: RRR, no murmurs appreciated. Trace edema to mid shin b/l Respiratory: b/l crackles at bases. No wheezes or rhonchi. Increased WOB at rest, with visible worsening with speech Abdomen: soft, nt, nd, +bs MSK: moving limbs spontaneously Skin: no rashes noted. Edema over cervical spine without bruising. Small bruises on upper extremities Psych: appropriate affect Neuro. AO x4 Cranial Nerves II - XII - II - Visual field intact  III, IV, VI - Extraocular movements intact. V - Facial sensation intact bilaterally. VII - Facial movement intact bilaterally. VIII - Hearing & vestibular intact bilaterally. X - Palate elevates symmetrically, no dysarthria. XI - Chin turning & shoulder shrug intact bilaterally. XII - Tongue protrusion intact.  Motor Strength - The patient's strength was 5/5 in all extremities and pronator drift was absent. Bulk was normal and fasciculations were absent.  Motor Tone - Muscle tone was assessed at the neck and appendages and was normal.  Sensory - Light touch, were symmetrical.   Coordination - The  patient had normal movements in the hands with no ataxia or dysmetria. Tremor was absent.  Gait and Station - deferred due to safety concerns.   Labs and Imaging: CBC BMET   Recent Labs Lab 10/19/15 1118  WBC 8.5  HGB 8.9*  HCT 29.4*  PLT 236    Recent Labs Lab 10/19/15 1118  NA 135  K 4.1  CL 98*  CO2 28  BUN 77*  CREATININE 2.21*  GLUCOSE 149*  CALCIUM 9.5     Troponin 0.01 x1  Dg Chest 2 View  Result Date: 10/19/2015 CLINICAL DATA:  Recent falls.  Head injury EXAM: CHEST  2 VIEW COMPARISON:  10/01/2015 FINDINGS: Cardiac enlargement with pulmonary vascular congestion. Prominent interstitial markings suggesting mild interstitial edema. No significant pleural effusion. Implanted generator device in the left chest with leads extending into the left neck unchanged from the prior study. Possible deep brain stimulator. IMPRESSION: Congestive heart failure with mild interstitial edema. Electronically Signed   By: Franchot Gallo M.D.   On: 10/19/2015 12:27   Dg Cervical Spine Complete  Result Date: 10/19/2015 CLINICAL DATA:  Fall. EXAM: CERVICAL SPINE - COMPLETE 4+ VIEW COMPARISON:  None. FINDINGS: Mild anterior slip at C6-7 likely due to facet degeneration. Disc degeneration and mild spurring at C5-6. Diffuse cervical facet degeneration. Mild foraminal encroachment bilaterally at C5-6 and C6-7 due to spurring. Negative for fracture.  Prevertebral soft tissues normal. IMPRESSION: Cervical disc and facet degeneration. Mild anterior slip at C6-7 felt to be related to facet degeneration. Negative for fracture. Electronically Signed   By: Franchot Gallo M.D.   On: 10/19/2015 12:29   Dg Thoracic Spine 2 View  Result Date: 10/19/2015 CLINICAL DATA:  Fall EXAM: THORACIC SPINE 2 VIEWS COMPARISON:  Lateral chest x-ray 08/12/2015 FINDINGS: Negative for thoracic fracture. Disc degeneration and anterior spurring in the thoracic spine. No bony lesion. Mild dextroscoliosis mid thoracic spine.  IMPRESSION: Thoracic degenerative change.  Negative for fracture. Electronically Signed   By: Franchot Gallo M.D.   On: 10/19/2015 12:31    Bufford Lope, DO 10/19/2015, 1:06 PM PGY-1, House Intern pager: [phone], text pages welcome  I have seen and examined the patient. I have read and agree with the above note. My changes are noted in blue.  Latham Kinzler A. Lincoln Brigham MD, [facility] Resident PGY-2 Pager [phone]

## 2015-10-19 NOTE — ED Notes (Signed)
PA Geiple at bedside.  

## 2015-10-19 NOTE — ED Provider Notes (Signed)
[facility] DEPT Provider Note   CSN: [id number] Arrival date & time: 10/19/15  1048  First Provider Contact:  None     History   Chief Complaint Chief Complaint  Patient presents with  . Shortness of Breath    HPI Melissa Powers is a 70 y.o. female.  Patient with history of congestive heart failure (was supposed to be on torseamide as of 6/17 per Care Everywhere), pneumonia, chronic kidney disease, diabetes, atrial fibrillation -- presents with complaint of progressive shortness of breath over the past 2 weeks. Patient has also had associated mild swelling in her legs, swelling in her abdomen and swelling in her bilateral arms. Patient states that she went to [facility] emergency department 4 days ago and was treated with Lasix and discharge. Symptoms continued to worsen over the weekend. Patient has had increasing orthopnea and exertional dyspnea. She is urinating normally. Not currently taking any Lasix. She has felt extremely weak over the past several days and has fallen at least 4 times. Last night she fell and struck the base of her neck and upper back on the toilet. She has also fallen and sustained bruises to her right upper arm, bilateral legs, and right ribs. EMS was called this morning and upon arrival patient had oxygen saturation of 80% on room air. Patient typically wears oxygen at night but not during the day. The onset of this condition was acute. Alleviating factors: none.       Past Medical History:  Diagnosis Date  . Anxiety   . Atrial fibrillation ([place])   . CHF (congestive heart failure) ([place])   . Chronic kidney disease   . Diabetes ([place])   . Hyperlipidemia   . Hypertension   . Tremor     There are no active problems to display for this patient.   History reviewed. No pertinent surgical history.  OB History    No data available       Home Medications    Prior to Admission medications   Medication Sig Start Date End Date Taking? Authorizing  Provider  amiodarone (PACERONE) 200 MG tablet Take 200 mg by mouth daily.    Historical Provider, MD  amitriptyline (ELAVIL) 100 MG tablet Take 100 mg by mouth at bedtime.    Historical Provider, MD  amLODipine (NORVASC) 10 MG tablet Take 5 mg by mouth daily.    Historical Provider, MD  busPIRone (BUSPAR) 15 MG tablet Take 15 mg by mouth 2 (two) times daily.    Historical Provider, MD  canagliflozin (INVOKANA) 100 MG TABS tablet Take 100 mg by mouth daily.    Historical Provider, MD  carbidopa-levodopa (SINEMET IR) 25-100 MG per tablet Take 1 tablet by mouth 3 (three) times daily.    Historical Provider, MD  citalopram (CELEXA) 20 MG tablet Take 20 mg by mouth daily.    Historical Provider, MD  digoxin (LANOXIN) 0.125 MG tablet Take by mouth daily.    Historical Provider, MD  doxazosin (CARDURA) 8 MG tablet Take 8 mg by mouth daily.    Historical Provider, MD  fenofibrate 160 MG tablet Take 160 mg by mouth daily.    Historical Provider, MD  gabapentin (NEURONTIN) 100 MG capsule Take 100 mg by mouth 3 (three) times daily.    Historical Provider, MD  hydrochlorothiazide (HYDRODIURIL) 25 MG tablet Take 25 mg by mouth daily.    Historical Provider, MD  hydrOXYzine (ATARAX/VISTARIL) 25 MG tablet Take 25 mg by mouth 3 (three) times daily as needed.  Historical Provider, MD  metoprolol succinate (TOPROL-XL) 100 MG 24 hr tablet Take 100 mg by mouth daily. Take with or immediately following a meal.    Historical Provider, MD  Milnacipran HCl (SAVELLA) 100 MG TABS tablet Take 100 mg by mouth 2 (two) times daily.    Historical Provider, MD  primidone (MYSOLINE) 50 MG tablet Take 50 mg by mouth at bedtime.    Historical Provider, MD  Rivaroxaban (XARELTO) 15 MG TABS tablet Take 15 mg by mouth daily with supper.    Historical Provider, MD  sitaGLIPtin (JANUVIA) 100 MG tablet Take 100 mg by mouth daily.    Historical Provider, MD  traZODone (DESYREL) 50 MG tablet Take 50 mg by mouth at bedtime.    Historical  Provider, MD  valsartan (DIOVAN) 320 MG tablet Take 320 mg by mouth daily.    Historical Provider, MD    Family History No family history on file.  Social History Social History  Substance Use Topics  . Smoking status: Never Smoker  . Smokeless tobacco: Never Used  . Alcohol use No     Allergies   Morphine and related   Review of Systems Review of Systems  Constitutional: Negative for diaphoresis and fever.  Eyes: Negative for redness.  Respiratory: Positive for shortness of breath. Negative for cough.   Cardiovascular: Positive for leg swelling. Negative for chest pain and palpitations.  Gastrointestinal: Negative for abdominal pain, nausea and vomiting.  Genitourinary: Negative for difficulty urinating, dysuria and frequency.  Musculoskeletal: Positive for myalgias and neck pain. Negative for back pain.  Skin: Negative for rash.  Neurological: Negative for syncope and light-headedness.  Psychiatric/Behavioral: The patient is not nervous/anxious.      Physical Exam Updated Vital Signs BP (!) 150/51 (BP Location: Right Arm)   Pulse 62   Temp 98.6 F (37 C) (Oral)   Resp 18   SpO2 97%   Physical Exam  Constitutional: She appears well-developed and well-nourished.  HENT:  Head: Normocephalic and atraumatic.  Mouth/Throat: Mucous membranes are normal. Mucous membranes are not dry.  Eyes: Conjunctivae are normal.  Neck: Trachea normal and normal range of motion. Neck supple. JVD present. Normal carotid pulses present. No muscular tenderness present. Carotid bruit is not present. No tracheal deviation present.  Cardiovascular: Normal rate, regular rhythm, S1 normal, S2 normal, normal heart sounds and intact distal pulses.  Exam reveals no decreased pulses.   No murmur heard. Pulmonary/Chest: Effort normal. No respiratory distress. She has no wheezes. She exhibits no tenderness.  Abdominal: Soft. Normal aorta and bowel sounds are normal. There is no tenderness. There  is no rebound and no guarding.  Musculoskeletal: Normal range of motion. She exhibits edema (1+ bilateral edema of the ankles.).  Neurological: She is alert.  Skin: Skin is warm and dry. She is not diaphoretic. No cyanosis. No pallor.  Psychiatric: She has a normal mood and affect.  Nursing note and vitals reviewed.    ED Treatments / Results  Labs (all labs ordered are listed, but only abnormal results are displayed) Labs Reviewed  CBC WITH DIFFERENTIAL/PLATELET - Abnormal; Notable for the following:       Result Value   RBC 3.22 (*)    Hemoglobin 8.9 (*)    HCT 29.4 (*)    All other components within normal limits  COMPREHENSIVE METABOLIC PANEL - Abnormal; Notable for the following:    Chloride 98 (*)    Glucose, Bld 149 (*)    BUN 77 (*)  Creatinine, Ser 2.21 (*)    ALT 13 (*)    Alkaline Phosphatase 32 (*)    GFR calc non Af Amer 21 (*)    GFR calc Af Amer 25 (*)    All other components within normal limits  BRAIN NATRIURETIC PEPTIDE - Abnormal; Notable for the following:    B Natriuretic Peptide 1,057.9 (*)    All other components within normal limits  URINALYSIS, ROUTINE W REFLEX MICROSCOPIC (NOT AT [facility])  Randolm Idol, ED    EKG  EKG Interpretation  Date/Time:  Tuesday October 19 2015 10:56:29 EDT Ventricular Rate:  63 PR Interval:    QRS Duration: 306 QT Interval:  451 QTC Calculation: 462 R Axis:   -65 Text Interpretation:  Sinus or ectopic atrial rhythm Prolonged PR interval Left bundle branch block Artifact in lead(s) II III aVR aVL aVF V1 V2 V3 V4 V5 V6 When compared to prior: New TWI in lateral leads Intraventricular conduction delay Baseline wander Confirmed by Ellender Hose MD, Lysbeth Galas [phone]) on 10/19/2015 11:13:15 AM       Radiology Dg Chest 2 View  Result Date: 10/19/2015 CLINICAL DATA:  Recent falls.  Head injury EXAM: CHEST  2 VIEW COMPARISON:  10/01/2015 FINDINGS: Cardiac enlargement with pulmonary vascular congestion. Prominent interstitial  markings suggesting mild interstitial edema. No significant pleural effusion. Implanted generator device in the left chest with leads extending into the left neck unchanged from the prior study. Possible deep brain stimulator. IMPRESSION: Congestive heart failure with mild interstitial edema. Electronically Signed   By: Franchot Gallo M.D.   On: 10/19/2015 12:27   Dg Cervical Spine Complete  Result Date: 10/19/2015 CLINICAL DATA:  Fall. EXAM: CERVICAL SPINE - COMPLETE 4+ VIEW COMPARISON:  None. FINDINGS: Mild anterior slip at C6-7 likely due to facet degeneration. Disc degeneration and mild spurring at C5-6. Diffuse cervical facet degeneration. Mild foraminal encroachment bilaterally at C5-6 and C6-7 due to spurring. Negative for fracture.  Prevertebral soft tissues normal. IMPRESSION: Cervical disc and facet degeneration. Mild anterior slip at C6-7 felt to be related to facet degeneration. Negative for fracture. Electronically Signed   By: Franchot Gallo M.D.   On: 10/19/2015 12:29   Dg Thoracic Spine 2 View  Result Date: 10/19/2015 CLINICAL DATA:  Fall EXAM: THORACIC SPINE 2 VIEWS COMPARISON:  Lateral chest x-ray 08/12/2015 FINDINGS: Negative for thoracic fracture. Disc degeneration and anterior spurring in the thoracic spine. No bony lesion. Mild dextroscoliosis mid thoracic spine. IMPRESSION: Thoracic degenerative change.  Negative for fracture. Electronically Signed   By: Franchot Gallo M.D.   On: 10/19/2015 12:31    Procedures Procedures (including critical care time)  Medications Ordered in ED Medications  furosemide (LASIX) injection 40 mg (40 mg Intravenous Given 10/19/15 1256)     Initial Impression / Assessment and Plan / ED Course  I have reviewed the triage vital signs and the nursing notes.  Pertinent labs & imaging results that were available during my care of the patient were reviewed by me and considered in my medical decision making (see chart for details).  Clinical Course    11:14 AM Patient seen and examined. Work-up initiated. Medications ordered.   Vital signs reviewed and are as follows: BP (!) 150/51 (BP Location: Right Arm)   Pulse 62   Temp 98.6 F (37 C) (Oral)   Resp 18   SpO2 97%   12:56 PM Results reviewed. Will admit for CHF exacerbation, acute on chronic kidney injury.   Final Clinical Impressions(s) / ED  Diagnoses   Final diagnoses:  Acute on chronic congestive heart failure, unspecified congestive heart failure type ([place])  Acute kidney injury superimposed on chronic kidney disease ([place])   Admit for CHF.   New Prescriptions New Prescriptions   No medications on file     Carlisle Cater, PA-C 10/19/15 1314    Duffy Bruce, MD 10/20/15 [phone]

## 2015-10-19 NOTE — ED Notes (Signed)
PA Geiple at bedside to discuss admission with patient.

## 2015-10-19 NOTE — ED Notes (Signed)
Placed patient into a gown and on the monitor 

## 2015-10-19 NOTE — ED Triage Notes (Signed)
Pt arrives via  ems for c/o sob and generalized edema x 2 weeks. Pt reports being seen at [facility] Friday and was given lasix there but no rx to go home with. Pt does wear oxygen at night. EMS reports pt's O2 sat was 80% on room air, came up to 94% on 6L [place]. Pt also reports having multiple falls last night which is abnormal for her. Pt a/ox4.

## 2015-10-19 NOTE — ED Notes (Signed)
Admitting physician at bedside

## 2015-10-19 NOTE — ED Notes (Signed)
Patient transported to X-ray 

## 2015-10-20 ENCOUNTER — Inpatient Hospital Stay (HOSPITAL_COMMUNITY): Payer: Medicare Other

## 2015-10-20 DIAGNOSIS — W19XXXD Unspecified fall, subsequent encounter: Secondary | ICD-10-CM

## 2015-10-20 DIAGNOSIS — I509 Heart failure, unspecified: Secondary | ICD-10-CM

## 2015-10-20 LAB — CBC
HEMATOCRIT: 26.9 % — AB (ref 36.0–46.0)
HEMOGLOBIN: 8.3 g/dL — AB (ref 12.0–15.0)
MCH: 27.6 pg (ref 26.0–34.0)
MCHC: 30.9 g/dL (ref 30.0–36.0)
MCV: 89.4 fL (ref 78.0–100.0)
Platelets: 231 10*3/uL (ref 150–400)
RBC: 3.01 MIL/uL — AB (ref 3.87–5.11)
RDW: 14.8 % (ref 11.5–15.5)
WBC: 9.6 10*3/uL (ref 4.0–10.5)

## 2015-10-20 LAB — ECHOCARDIOGRAM COMPLETE
Height: 63 in
Weight: 2950.4 oz

## 2015-10-20 LAB — GLUCOSE, CAPILLARY
GLUCOSE-CAPILLARY: 124 mg/dL — AB (ref 65–99)
GLUCOSE-CAPILLARY: 154 mg/dL — AB (ref 65–99)
Glucose-Capillary: 129 mg/dL — ABNORMAL HIGH (ref 65–99)
Glucose-Capillary: 183 mg/dL — ABNORMAL HIGH (ref 65–99)

## 2015-10-20 LAB — BASIC METABOLIC PANEL
ANION GAP: 12 (ref 5–15)
BUN: 68 mg/dL — ABNORMAL HIGH (ref 6–20)
CO2: 29 mmol/L (ref 22–32)
Calcium: 9.3 mg/dL (ref 8.9–10.3)
Chloride: 96 mmol/L — ABNORMAL LOW (ref 101–111)
Creatinine, Ser: 2.04 mg/dL — ABNORMAL HIGH (ref 0.44–1.00)
GFR calc non Af Amer: 24 mL/min — ABNORMAL LOW (ref >60)
GFR, EST AFRICAN AMERICAN: 27 mL/min — AB (ref >60)
GLUCOSE: 107 mg/dL — AB (ref 65–99)
POTASSIUM: 3.6 mmol/L (ref 3.5–5.1)
Sodium: 137 mmol/L (ref 135–145)

## 2015-10-20 MED ORDER — DEXTROSE 5 % IV SOLN
1.0000 g | INTRAVENOUS | Status: DC
  Administered 2015-10-20: 1 g via INTRAVENOUS
  Filled 2015-10-20 (×2): qty 10

## 2015-10-20 MED ORDER — GABAPENTIN 100 MG PO CAPS
100.0000 mg | ORAL_CAPSULE | Freq: Three times a day (TID) | ORAL | Status: DC
  Administered 2015-10-20 – 2015-10-22 (×7): 100 mg via ORAL
  Filled 2015-10-20 (×7): qty 1

## 2015-10-20 MED ORDER — FUROSEMIDE 10 MG/ML IJ SOLN
60.0000 mg | Freq: Two times a day (BID) | INTRAMUSCULAR | Status: DC
  Administered 2015-10-20 – 2015-10-22 (×4): 60 mg via INTRAVENOUS
  Filled 2015-10-20 (×4): qty 6

## 2015-10-20 MED ORDER — POTASSIUM CHLORIDE CRYS ER 20 MEQ PO TBCR
20.0000 meq | EXTENDED_RELEASE_TABLET | Freq: Two times a day (BID) | ORAL | Status: DC
  Administered 2015-10-20 – 2015-10-22 (×5): 20 meq via ORAL
  Filled 2015-10-20: qty 2
  Filled 2015-10-20 (×4): qty 1

## 2015-10-20 NOTE — Progress Notes (Signed)
Pt had a one spike of fever 100.1 last night, called Family medicine for tylenol order, but after consulting MD, decided to give tylenol only if temperature is more than 100.4, later at 0300 it drop down to 98.7, otherwise pt slept well at night, incontinent most of the time, will continue to monitor the patient.

## 2015-10-20 NOTE — Progress Notes (Signed)
Pulse by  Dinamap - 52 Pulse by manual- 56

## 2015-10-20 NOTE — Progress Notes (Signed)
Patient seen and examined.  Full consult note to follow.   Improving with IV alsix but still volume overloaded. Increase lasix to 60 bid. Will have echo today.   Place TED Hose.   Gradie Ohm,MD 11:36 AM

## 2015-10-20 NOTE — Evaluation (Signed)
Occupational Therapy Evaluation Patient Details Name: Melissa Powers MRN: [id number] DOB: 1946-01-25 Today's Date: 10/20/2015    History of Present Illness 70 yo female admitted with shortness of breath. States started 2 weeks ago, usually can ambulate to bathroom w/walker but lately has been finding it difficult to ambulate even short distances and has had increased SOB with speech. Has noticed increased LE edema over the last 2 weeks as well. Has orthopnea at baseline. Usually on home O2 2L at nighttime but recently has needed during the day. States went to [facility] 4 days ago for similar complaint, was given lasix and discharged. States her SOB has since worsened. Per EMS was satting in 47s on room air. Has fallen 4 times over last day PTA.   Clinical Impression   Pt presents to therapy with above.  Ambulated to bathroom with therapist with RW and min assist-min guard.  Pt with 1 LOB when approaching toilet able to regain balance using RW and grab bar/wall with min assist from therapist. Completed grooming tasks in standing at sink with supervision, pt reports family and hired caregiver that assist with self-care tasks and will not require follow up OT at this time.  Pt demonstrates decreased activity tolerance/endurance and will benefit from PT to address balance deficits, cardiorespiratory endurance, and overall mobility.      Follow Up Recommendations  No OT follow up    Equipment Recommendations  None recommended by OT    Recommendations for Other Services PT consult     Precautions / Restrictions Precautions Precautions: Fall      Mobility Bed Mobility Overal bed mobility: Modified Independent             General bed mobility comments: able to complete bed mobility without assist  Transfers Overall transfer level: Needs assistance Equipment used: Rolling walker (2 wheeled) Transfers: Sit to/from Stand Sit to Stand: Supervision (cues for hand placement)                   ADL Overall ADL's : At baseline;Needs assistance/impaired     Grooming: Wash/dry hands;Wash/dry face;Supervision/safety               Lower Body Dressing: Set up Lower Body Dressing Details (indicate cue type and reason): donned hospital socks with setup assist Toilet Transfer: Minimal assistance;Ambulation;RW;Comfort height toilet;Grab bars   Toileting- Clothing Manipulation and Hygiene: Min guard;Sit to/from stand       Functional mobility during ADLs: Min guard;Minimal assistance General ADL Comments: Pt required min assist-min guard with ambulation with RW.  One LOB when approaching toilet but able to correct with RW and grabbar/wall.       Vision Vision Assessment?: No apparent visual deficits   Perception     Praxis      Pertinent Vitals/Pain Pain Assessment: No/denies pain     Hand Dominance Right   Extremity/Trunk Assessment Upper Extremity Assessment Upper Extremity Assessment: Generalized weakness (grossly 3+/5, loose gross grasp)   Lower Extremity Assessment Lower Extremity Assessment: Defer to PT evaluation       Communication Communication Communication: No difficulties   Cognition Arousal/Alertness: Awake/alert Behavior During Therapy: WFL for tasks assessed/performed Overall Cognitive Status: Within Functional Limits for tasks assessed                                Home Living Family/patient expects to be discharged to:: Private residence Living Arrangements: Spouse/significant other Available Help  at Discharge: Family               Bathroom Shower/Tub: Tub/shower unit         Home Equipment: Environmental consultant - 2 wheels;Cane - single point;Shower seat          Prior Functioning/Environment Level of Independence: Needs assistance  Gait / Transfers Assistance Needed: Mod I with RW ADL's / Homemaking Assistance Needed: Had a hired aide 3x/week to assist with bathing and dressing.  Did not complete any  IADLs        OT Diagnosis: Generalized weakness   OT Problem List: Decreased activity tolerance;Impaired balance (sitting and/or standing);Cardiopulmonary status limiting activity;Increased edema   OT Treatment/Interventions:      OT Goals(Current goals can be found in the care plan section) Acute Rehab OT Goals Patient Stated Goal: to improve balance OT Goal Formulation: All assessment and education complete, DC therapy        End of Session Equipment Utilized During Treatment: Gait belt;Rolling walker;Oxygen Nurse Communication: Mobility status  Activity Tolerance: Patient tolerated treatment well;Patient limited by fatigue Patient left: in bed;with call bell/phone within reach   Time: 1319-1343 OT Time Calculation (min): 24 min Charges:  OT General Charges $OT Visit: 1 Procedure OT Evaluation $OT Eval Moderate Complexity: 1 Procedure OT Treatments $Self Care/Home Management : 8-22 mins G-CodesSimonne Come, [phone] 10/20/2015, 2:00 PM

## 2015-10-20 NOTE — Progress Notes (Signed)
  Echocardiogram 2D Echocardiogram has been performed.  Tresa Res 10/20/2015, 12:02 PM

## 2015-10-20 NOTE — Progress Notes (Signed)
PT Cancellation Note  Patient Details Name: Melissa Powers MRN: [id number] DOB: 1946/01/07   Cancelled Treatment:    Reason Eval/Treat Not Completed: Other (comment) (Attempted to evaluate and medical visit initiated).  Try later as time allows.   Ramond Dial 10/20/2015, 9:17 AM    Mee Hives, PT MS Acute Rehab Dept. Number: [place] [facility] [place]

## 2015-10-20 NOTE — Progress Notes (Signed)
Family Medicine Teaching Service Daily Progress Note Intern Pager: [phone]  Patient name: Melissa Powers record number: [id number] Date of birth: 15-Apr-1945 Age: 70 y.o. Gender: female  Primary Care Provider: Maris Berger, MD Consultants: none Code Status: FULL  Pt Overview and Major Events to Date:  8/1 Admitted for CHF exacerbation   Assessment and Plan: Melissa Powers is a 70 y.o. female presenting with shortness of breath . PMH is significant for HFpEF, Afib, CKD Stage 3, T2DM, HTN, HLD, anxiety, Parkinson's disease, fibromyalgia.  Acute on chronic respiratory failure 2/2 CHF exacerbation. H/o of HFpEF. On admit+JVD, BNP 1057, CXR showing cardiac enlargement and mild interstitial edema. Last echo in care everywhere 12/22/2014 EF 60-65% with mild concentric LV hypertrophy. At home on torsemide at home 40 mg in the AM, 20 mg at lunch time. Still 14lbs up from dry weight but down 5 lbs from admission and patient symptomatically feels better. - Daily weights, I/Os - continue atrovent neb - Continue monitoring on IV lasix 40 BID UOP only 548mL, difficult to assess d/t patient urinary incontinence at baseline, no foley for now.  - echo today - daily BMP - cardiology HF team following, appreciate recommendations  Bradycardia, new. HR since midnight low to mid 50s. Patient initially thought was on flecainide as per care everywhere notes/[facility] list. This am remembers that not actually on flecainide as outpatient, has been taking amiodarone. Possibly medication interaction of amiodarone with norvasc despite patient stable on regimen in the past. Unlikely new flecainide given is not a known medication side effect - holding metoprolol - appreciate cardiology recommendations  Weakness s/p fall Likely d/t SOB vs deconditioning. Cervical and thoracic xrays neg for fracture. CT head no acute intracranial abnormality.  - PT/OT consulted, appreciate recommendations  Paroxysmal Afib at  home on ASA81, metoprolol succinate, xarelto, and diltiazem prn if HR >110. At home on amiodarone but per car everywhere note at [facility] recently changed to flecainide on 10/08/15 because not able to afford medication as outpatient. This am patient states has been on amiodarone, never been on flecainide - monitor on telemetry - continue ASA81, xarelto - hold diltiazem since currently rate controlled.  - Hold metoprolol since bradycardic this am - appreciate cardiology recommendations regarding amiodarone vs flecainide in the setting of new bradycardia   CKD Stage 3 baseline in care everywhere creatinine 1.1-1.4, now 2.01 - Monitor BMP  T2DM with neuropathy per patient at home on metformin invokana, gabapentin, sitagliptin. Last A1c in care everywhere 7 at unknown date - SSI - monitor CBGs - A1c pending - continue gabapentin  HTN at home on norvasc, diovan, hydralazine, metoprolol succinate, and diltiazem prn HR >110. Now hypotensive 115/38 - Monitor BP - continue norvasc, ARB - DC hydralazine  HLD at home on atorvastatin and fenofibrate - continue home meds  Anxiety at home on citalopram - continue home med  Parkinson's disease has deep brain stimulator. at home on sinemet, primidone, requip - continue home meds  Fibromyalgia at home on citalopram - continue home med  FEN/GI: SLIV, heart healthy carb modified diet Prophylaxis: on xarelto for Afib  Disposition: pending  Subjective:  States feels SOB that is slightly improved from admission but still not back at her baseline. Has been urinating more than usual. States usually when hospitalized for CHF exacerbation has had foley in to monitor I/Os. No dysuria.  Objective: Temp:  [98.4 F (36.9 C)-100.1 F (37.8 C)] 99.4 F (37.4 C) (08/02 0448) Pulse Rate:  [52-78] 56 (  08/02 0617) Resp:  [16-24] 20 (08/02 0448) BP: (111-157)/(38-79) 115/38 (08/02 0448) SpO2:  [92 %-98 %] 96 % (08/02 0448) Weight:  [184 lb  6.4 oz (83.6 kg)-189 lb 14.4 oz (86.1 kg)] 184 lb 6.4 oz (83.6 kg) (08/02 0519) Physical Exam: General: Sitting up in bed, in NAD Cardiovascular: RRR, no murmurs appreciated. + JVD. Respiratory: CTAB. No wheezes or rhonchi. Increased WOB at rest, with visible worsening with speech Abdomen: soft, nt, nd, +bs Extremities:  Trace edema to mid shin b/l  Laboratory:  Recent Labs Lab 10/19/15 1118 10/20/15 0453  WBC 8.5 PENDING  HGB 8.9* 8.3*  HCT 29.4* 26.9*  PLT 236 231    Recent Labs Lab 10/19/15 1118 10/20/15 0453  NA 135 137  K 4.1 3.6  CL 98* 96*  CO2 28 29  BUN 77* 68*  CREATININE 2.21* 2.04*  CALCIUM 9.5 9.3  PROT 7.4  --   BILITOT 0.8  --   ALKPHOS 32*  --   ALT 13*  --   AST 21  --   GLUCOSE 149* 107*   TSH 2.157   Imaging/Diagnostic Tests: Dg Chest 2 View  Result Date: 10/19/2015 CLINICAL DATA:  Recent falls.  Head injury EXAM: CHEST  2 VIEW COMPARISON:  10/01/2015 FINDINGS: Cardiac enlargement with pulmonary vascular congestion. Prominent interstitial markings suggesting mild interstitial edema. No significant pleural effusion. Implanted generator device in the left chest with leads extending into the left neck unchanged from the prior study. Possible deep brain stimulator. IMPRESSION: Congestive heart failure with mild interstitial edema. Electronically Signed   By: Franchot Gallo M.D.   On: 10/19/2015 12:27   Dg Cervical Spine Complete  Result Date: 10/19/2015 CLINICAL DATA:  Fall. EXAM: CERVICAL SPINE - COMPLETE 4+ VIEW COMPARISON:  None. FINDINGS: Mild anterior slip at C6-7 likely due to facet degeneration. Disc degeneration and mild spurring at C5-6. Diffuse cervical facet degeneration. Mild foraminal encroachment bilaterally at C5-6 and C6-7 due to spurring. Negative for fracture.  Prevertebral soft tissues normal. IMPRESSION: Cervical disc and facet degeneration. Mild anterior slip at C6-7 felt to be related to facet degeneration. Negative for fracture.  Electronically Signed   By: Franchot Gallo M.D.   On: 10/19/2015 12:29   Dg Thoracic Spine 2 View  Result Date: 10/19/2015 CLINICAL DATA:  Fall EXAM: THORACIC SPINE 2 VIEWS COMPARISON:  Lateral chest x-ray 08/12/2015 FINDINGS: Negative for thoracic fracture. Disc degeneration and anterior spurring in the thoracic spine. No bony lesion. Mild dextroscoliosis mid thoracic spine. IMPRESSION: Thoracic degenerative change.  Negative for fracture. Electronically Signed   By: Franchot Gallo M.D.   On: 10/19/2015 12:31   Ct Head Wo Contrast  Result Date: 10/19/2015 CLINICAL DATA:  Multiple falls. EXAM: CT HEAD WITHOUT CONTRAST TECHNIQUE: Contiguous axial images were obtained from the base of the skull through the vertex without intravenous contrast. COMPARISON:  CT scan of July 07, 2015. FINDINGS: Bony calvarium appears intact. Stimulator is seen entering left frontal skull with lead in left thalamus. This is unchanged compared to prior exam. Mild diffuse cortical atrophy is noted. No mass effect or midline shift is noted. Ventricular size is within normal limits. There is no evidence of mass lesion, hemorrhage or acute infarction. IMPRESSION: Stable position of left-sided stimulator lead. Mild diffuse cortical atrophy. No acute intracranial abnormality seen. Electronically Signed   By: Marijo Conception, M.D.   On: 10/19/2015 14:56    Bufford Lope, DO 10/20/2015, 6:45 AM PGY-1, [place]  Intern pager: [phone], text pages welcome

## 2015-10-20 NOTE — Consult Note (Addendum)
Advanced Heart Failure Team Consult Note  Referring Physician: Dr. Ree Kida Primary Physician: Dr. Salvadore Oxford Primary Cardiologist:  Dr. Clayburn Pert  Reason for Consultation: A/C diastolic CHF  HPI:    Melissa Powers is a 70 y.o. female with PMH of HFpEF, Afib on anticoagulation, CKD Stage 3, T2DM, HTN, HLD, and Parkinson's disease presented with acute on chronic respiratory failure.  Dr. Haroldine Laws has been following at a distance in the community as part of [facility] program in [place].   Last seen in cardiology by Dr. Shirlee More on 08/24/15. Diuretics adjusted with increase in weight. She was scheduled for a 24 hour holter monitor with occasional breathrough PAF symptoms. These results do not appear to be available in Care Everywhere.   She [facility]19[facility]15 with worsening DOE over the past several weeks accompanied by generalized weakness.  No CP. Has history of frequent falls. Pertinent labs on admission include creatinine 2.2 (Baseline ~1.7), K 4.1, BNP 1057.9, Troponin negative. CXR showed CHF with mild interstitial edema.   So far only out 171 cc, though weight shows down 5 lbs. Creatinine improving with diuresis.  Feeling slightly better today. Still having some SOB.   Review of Systems: [y] = yes, [ ]  = no   General: Weight gain Blue.Reese ]; Weight loss [ ] ; Anorexia [ ] ; Fatigue [y]; Fever [ ] ; Chills [ ] ; Weakness [ y]  Cardiac: Chest pain/pressure [ ] ; Resting SOB [ y]; Exertional SOB [y]; Orthopnea [y]; Pedal Edema Blue.Reese ]; Palpitations [ ] ; Syncope [ ] ; Presyncope [ ] ; Paroxysmal nocturnal dyspnea[ ]   Pulmonary: Cough [ ] ; Wheezing[ ] ; Hemoptysis[ ] ; Sputum [ ] ; Snoring [ ]   GI: Vomiting[ ] ; Dysphagia[ ] ; Melena[ ] ; Hematochezia [ ] ; Heartburn[ ] ; Abdominal pain [ ] ; Constipation [ ] ; Diarrhea [ ] ; BRBPR [ ]   GU: Hematuria[ ] ; Dysuria [ ] ; Nocturia[ ]   Vascular: Pain in legs with walking [ ] ; Pain in feet with lying flat [ ] ; Non-healing sores [ ] ; Stroke [ ] ; TIA [ ] ;  Slurred speech [ ] ;  Neuro: Headaches[ ] ; Vertigo[ ] ; Seizures[ ] ; Paresthesias[ ] ;Blurred vision [ ] ; Diplopia [ ] ; Vision changes [ ]   Ortho/Skin: Arthritis Blue.Reese ]; Joint pain [ y]; Muscle pain [ ] ; Joint swelling [ ] ; Back Pain [ ] ; Rash [ ]   Psych: Depression[ ] ; Anxiety[ ]   Heme: Bleeding problems [ ] ; Clotting disorders [ ] ; Anemia [ ]   Endocrine: Diabetes [y]; Thyroid dysfunction[ ]   Home Medications Prior to Admission medications   Medication Sig Start Date End Date Taking? Authorizing Provider  amitriptyline (ELAVIL) 100 MG tablet Take 100 mg by mouth at bedtime.   Yes Historical Provider, MD  amLODipine (NORVASC) 10 MG tablet Take 5 mg by mouth daily.   Yes Historical Provider, MD  busPIRone (BUSPAR) 15 MG tablet Take 15 mg by mouth 2 (two) times daily.   Yes Historical Provider, MD  canagliflozin (INVOKANA) 100 MG TABS tablet Take 100 mg by mouth daily.   Yes Historical Provider, MD  carbidopa-levodopa (SINEMET IR) 25-100 MG per tablet Take 1 tablet by mouth 3 (three) times daily.   Yes Historical Provider, MD  citalopram (CELEXA) 20 MG tablet Take 20 mg by mouth daily.   Yes Historical Provider, MD  digoxin (LANOXIN) 0.125 MG tablet Take by mouth daily.   Yes Historical Provider, MD  doxazosin (CARDURA) 8 MG tablet Take 8 mg by mouth daily.   Yes Historical Provider, MD  fenofibrate 160  MG tablet Take 160 mg by mouth daily.   Yes Historical Provider, MD  gabapentin (NEURONTIN) 100 MG capsule Take 100 mg by mouth 3 (three) times daily.   Yes Historical Provider, MD  hydrochlorothiazide (HYDRODIURIL) 25 MG tablet Take 25 mg by mouth daily.   Yes Historical Provider, MD  hydrOXYzine (ATARAX/VISTARIL) 25 MG tablet Take 25 mg by mouth 3 (three) times daily as needed for anxiety or itching.    Yes Historical Provider, MD  metoprolol succinate (TOPROL-XL) 100 MG 24 hr tablet Take 100 mg by mouth daily. Take with or immediately following a meal.   Yes Historical Provider, MD  Milnacipran HCl  (SAVELLA) 100 MG TABS tablet Take 100 mg by mouth 2 (two) times daily.   Yes Historical Provider, MD  primidone (MYSOLINE) 50 MG tablet Take 50 mg by mouth at bedtime.   Yes Historical Provider, MD  Rivaroxaban (XARELTO) 15 MG TABS tablet Take 15 mg by mouth daily with supper.   Yes Historical Provider, MD  sitaGLIPtin (JANUVIA) 100 MG tablet Take 100 mg by mouth daily.   Yes Historical Provider, MD  traZODone (DESYREL) 50 MG tablet Take 50 mg by mouth at bedtime.   Yes Historical Provider, MD  valsartan (DIOVAN) 320 MG tablet Take 320 mg by mouth daily.   Yes Historical Provider, MD    Past Medical History: Past Medical History:  Diagnosis Date  . Anxiety   . Atrial fibrillation ([place])   . CHF (congestive heart failure) ([place])   . Chronic kidney disease (CKD), stage III (moderate)   . COPD (chronic obstructive pulmonary disease) ([place])   . History of home oxygen therapy    "2.5L; 24/7" (10/19/2015)  . Hyperlipidemia   . Hypertension   . Pneumonia 08/2015  . Sleep apnea    "tried mask; can't wear it" (10/19/2015)  . Tremor   . Type II diabetes mellitus ([place])     Past Surgical History: Past Surgical History:  Procedure Laterality Date  . ABDOMINAL HYSTERECTOMY    . TUBAL LIGATION      Family History: Parents deceased Brother with HF No family h/o sudden cardiac death or familial cardiomyopathy  Social History: Social History   Social History  . Marital status: Married    Spouse name: N/A  . Number of children: N/A  . Years of education: N/A   Social History Main Topics  . Smoking status: Never Smoker  . Smokeless tobacco: Never Used  . Alcohol use No  . Drug use: No  . Sexual activity: Not Currently   Other Topics Concern  . None   Social History Narrative  . None    Allergies:  Allergies  Allergen Reactions  . Morphine And Related Other (See Comments)    Pt reports intolerance due to sedation from morphine    Objective:    Vital Signs:   Temp:  [98.4  F (36.9 C)-100.1 F (37.8 C)] 98.4 F (36.9 C) (08/02 1230) Pulse Rate:  [52-71] 58 (08/02 1230) Resp:  [18-20] 18 (08/02 1230) BP: (115-157)/(35-41) 130/35 (08/02 1230) SpO2:  [93 %-98 %] 96 % (08/02 1230) Weight:  [184 lb 6.4 oz (83.6 kg)-189 lb 14.4 oz (86.1 kg)] 184 lb 6.4 oz (83.6 kg) (08/02 0519) Last BM Date: 10/19/15  Weight change: Filed Weights   10/19/15 1545 10/20/15 0519  Weight: 189 lb 14.4 oz (86.1 kg) 184 lb 6.4 oz (83.6 kg)    Intake/Output:   Intake/Output Summary (Last 24 hours) at 10/20/15 1405 Last  data filed at 10/20/15 0847  Gross per 24 hour  Intake              480 ml  Output              651 ml  Net             -171 ml     Physical Exam: General:  Elderly sitting in chair. Conversational dyspnea.  HEENT: normal Neck: supple. JVP elevated . Carotids 2+ bilat; no bruits. No thyromegaly or nodule noted. Cor: PMI nondisplaced. Regular rate & rhythm. No M/G/R appreciated Lungs: Mildly diminished basilar sounds.  Abdomen: soft, NT, ND, no HSM. No bruits or masses. +BS  Extremities: no cyanosis, clubbing, rash. 1+ peripheral edema 1/2 to knee.  Neuro: alert & orientedx3, cranial nerves grossly intact. moves all 4 extremities w/o difficulty. Affect pleasant  Telemetry: NSR  Labs: Basic Metabolic Panel:  Recent Labs Lab 10/19/15 1118 10/20/15 0453  NA 135 137  K 4.1 3.6  CL 98* 96*  CO2 28 29  GLUCOSE 149* 107*  BUN 77* 68*  CREATININE 2.21* 2.04*  CALCIUM 9.5 9.3    Liver Function Tests:  Recent Labs Lab 10/19/15 1118  AST 21  ALT 13*  ALKPHOS 32*  BILITOT 0.8  PROT 7.4  ALBUMIN 3.9   No results for input(s): LIPASE, AMYLASE in the last 168 hours. No results for input(s): AMMONIA in the last 168 hours.  CBC:  Recent Labs Lab 10/19/15 1118 10/20/15 0453  WBC 8.5 9.6  NEUTROABS 6.7  --   HGB 8.9* 8.3*  HCT 29.4* 26.9*  MCV 91.3 89.4  PLT 236 231    Cardiac Enzymes: No results for input(s): CKTOTAL, CKMB, CKMBINDEX,  TROPONINI in the last 168 hours.  BNP: BNP (last 3 results)  Recent Labs  10/19/15 1129  BNP 1,057.9*    ProBNP (last 3 results) No results for input(s): PROBNP in the last 8760 hours.   CBG:  Recent Labs Lab 10/19/15 1658 10/19/15 2105 10/20/15 0544 10/20/15 1151  GLUCAP 223* 170* 124* 154*    Coagulation Studies: No results for input(s): LABPROT, INR in the last 72 hours.  Other results: EKG: Sinus brady with 1st degree AV block. 52 bpm  Imaging: Dg Chest 2 View  Result Date: 10/19/2015 CLINICAL DATA:  Recent falls.  Head injury EXAM: CHEST  2 VIEW COMPARISON:  10/01/2015 FINDINGS: Cardiac enlargement with pulmonary vascular congestion. Prominent interstitial markings suggesting mild interstitial edema. No significant pleural effusion. Implanted generator device in the left chest with leads extending into the left neck unchanged from the prior study. Possible deep brain stimulator. IMPRESSION: Congestive heart failure with mild interstitial edema. Electronically Signed   By: Franchot Gallo M.D.   On: 10/19/2015 12:27   Dg Cervical Spine Complete  Result Date: 10/19/2015 CLINICAL DATA:  Fall. EXAM: CERVICAL SPINE - COMPLETE 4+ VIEW COMPARISON:  None. FINDINGS: Mild anterior slip at C6-7 likely due to facet degeneration. Disc degeneration and mild spurring at C5-6. Diffuse cervical facet degeneration. Mild foraminal encroachment bilaterally at C5-6 and C6-7 due to spurring. Negative for fracture.  Prevertebral soft tissues normal. IMPRESSION: Cervical disc and facet degeneration. Mild anterior slip at C6-7 felt to be related to facet degeneration. Negative for fracture. Electronically Signed   By: Franchot Gallo M.D.   On: 10/19/2015 12:29   Dg Thoracic Spine 2 View  Result Date: 10/19/2015 CLINICAL DATA:  Fall EXAM: THORACIC SPINE 2 VIEWS COMPARISON:  Lateral chest x-ray  08/12/2015 FINDINGS: Negative for thoracic fracture. Disc degeneration and anterior spurring in the  thoracic spine. No bony lesion. Mild dextroscoliosis mid thoracic spine. IMPRESSION: Thoracic degenerative change.  Negative for fracture. Electronically Signed   By: Franchot Gallo M.D.   On: 10/19/2015 12:31   Ct Head Wo Contrast  Result Date: 10/19/2015 CLINICAL DATA:  Multiple falls. EXAM: CT HEAD WITHOUT CONTRAST TECHNIQUE: Contiguous axial images were obtained from the base of the skull through the vertex without intravenous contrast. COMPARISON:  CT scan of July 07, 2015. FINDINGS: Bony calvarium appears intact. Stimulator is seen entering left frontal skull with lead in left thalamus. This is unchanged compared to prior exam. Mild diffuse cortical atrophy is noted. No mass effect or midline shift is noted. Ventricular size is within normal limits. There is no evidence of mass lesion, hemorrhage or acute infarction. IMPRESSION: Stable position of left-sided stimulator lead. Mild diffuse cortical atrophy. No acute intracranial abnormality seen. Electronically Signed   By: Marijo Conception, M.D.   On: 10/19/2015 14:56      Medications:     Current Medications: . amLODipine  5 mg Oral Daily  . aspirin EC  81 mg Oral Daily  . atorvastatin  20 mg Oral q1800  . carbidopa-levodopa  1 tablet Oral TID  . citalopram  20 mg Oral Daily  . fenofibrate  160 mg Oral Daily  . furosemide  60 mg Intravenous BID  . gabapentin  100 mg Oral TID  . insulin aspart  0-5 Units Subcutaneous QHS  . insulin aspart  0-9 Units Subcutaneous TID WC  . irbesartan  300 mg Oral Daily  . potassium chloride  20 mEq Oral BID  . Rivaroxaban  15 mg Oral Q supper  . rOPINIRole  0.25 mg Oral TID  . sodium chloride flush  3 mL Intravenous Q12H  . sodium chloride flush  3 mL Intravenous Q12H     Infusions:      Assessment   1. Acute on chronic respiratory failure 2. Acute on chronic diastolic CHF 3. Paroxysmal Afib 4. AKI on CKD stage III 5. T2DM with neuropathy 6. HTN 7. HLD 8. Anxiety 9. Parkinsons with  falls and weakness.  Plan    She is improving with IV lasix though remains volume overloaded. Will increase lasix to 60 mg BID  Echo 10/20/15 LVEF 55-60%. Mod LAE, mild RVH and RAE. PA peak pressure 69 mm Hg.   UA grossly positive. Will start on rocephin.   Creatinine baseline ~ 1.7. 2.0 this am. Continue to follow closely with diuresis.  Supp K.   Length of Stay: 1  Melissa Friar PA-C 10/20/2015, 2:05 PM  Advanced Heart Failure Team Pager [phone] (M-F; 7a - 4p)  Please contact [facility] for night-coverage after hours (4p -7a ) and weekends on [email or web address]  Patient seen and examined with Oda Kilts, PA-C. We discussed all aspects of the encounter. I agree with the assessment and plan as stated above.   She is volume overloaded but improving with IV lasix. Will increase lasix to 60 IV bid. Renal function improving with diuresis - continue to watch closely. I have reviewed echo and she has significant diastolic dysfunction and RV failure with RVSP ~70. May need sleep study as outpatient.   Terease Marcotte,MD 4:27 PM

## 2015-10-20 NOTE — Progress Notes (Signed)
Chaplain stopped by to visit with Pt. Chaplain listened to stories and provided emotional support via prayer.

## 2015-10-20 NOTE — Progress Notes (Signed)
PT Cancellation Note  Patient Details Name: Melissa Powers MRN: [id number] DOB: [birth year]   Cancelled Treatment:    Reason Eval/Treat Not Completed: Other (comment) (reattempted but pt is having lunch).  Will try again tomorrow.   Ramond Dial 10/20/2015, 12:12 PM    Mee Hives, PT MS Acute Rehab Dept. Number: [place] and [place]

## 2015-10-21 DIAGNOSIS — I5033 Acute on chronic diastolic (congestive) heart failure: Secondary | ICD-10-CM

## 2015-10-21 LAB — GLUCOSE, CAPILLARY
GLUCOSE-CAPILLARY: 133 mg/dL — AB (ref 65–99)
GLUCOSE-CAPILLARY: 165 mg/dL — AB (ref 65–99)
Glucose-Capillary: 150 mg/dL — ABNORMAL HIGH (ref 65–99)
Glucose-Capillary: 143 mg/dL — ABNORMAL HIGH (ref 65–99)

## 2015-10-21 LAB — BASIC METABOLIC PANEL
Anion gap: 12 (ref 5–15)
BUN: 73 mg/dL — AB (ref 6–20)
CHLORIDE: 95 mmol/L — AB (ref 101–111)
CO2: 32 mmol/L (ref 22–32)
CREATININE: 1.93 mg/dL — AB (ref 0.44–1.00)
Calcium: 9.3 mg/dL (ref 8.9–10.3)
GFR calc non Af Amer: 25 mL/min — ABNORMAL LOW (ref >60)
GFR, EST AFRICAN AMERICAN: 29 mL/min — AB (ref >60)
Glucose, Bld: 99 mg/dL (ref 65–99)
Potassium: 4.3 mmol/L (ref 3.5–5.1)
SODIUM: 139 mmol/L (ref 135–145)

## 2015-10-21 LAB — CBC
HEMATOCRIT: 28.8 % — AB (ref 36.0–46.0)
Hemoglobin: 8.7 g/dL — ABNORMAL LOW (ref 12.0–15.0)
MCH: 27.2 pg (ref 26.0–34.0)
MCHC: 30.2 g/dL (ref 30.0–36.0)
MCV: 90 fL (ref 78.0–100.0)
PLATELETS: 231 10*3/uL (ref 150–400)
RBC: 3.2 MIL/uL — ABNORMAL LOW (ref 3.87–5.11)
RDW: 14.3 % (ref 11.5–15.5)
WBC: 7.1 10*3/uL (ref 4.0–10.5)

## 2015-10-21 MED ORDER — DEXTROSE 5 % IV SOLN
1.0000 g | INTRAVENOUS | Status: DC
  Administered 2015-10-21: 1 g via INTRAVENOUS
  Filled 2015-10-21 (×2): qty 10

## 2015-10-21 MED ORDER — ACETAMINOPHEN 325 MG PO TABS
650.0000 mg | ORAL_TABLET | Freq: Four times a day (QID) | ORAL | Status: DC | PRN
  Administered 2015-10-21 – 2015-10-22 (×2): 650 mg via ORAL
  Filled 2015-10-21 (×2): qty 2

## 2015-10-21 NOTE — Progress Notes (Signed)
Family Medicine Teaching Service Daily Progress Note Intern Pager: [phone]  Patient name: [place] record number: [id number] Date of birth: [birth year] Age: 70 y.o. Gender: female  Primary Care Provider: Maris Berger, MD Consultants: none  Code Status: FULL  Pt Overview and Major Events to Date:  8/1 Admitted for CHF exacerbation  Assessment and Plan: Melissa Powers a 70 y.o.femalepresenting with shortness of breath. PMH is significant for HFpEF, Afib on xarelto, CKD Stage 3, T2DM, HTN, HLD, anxiety, Parkinson's disease, fibromyalgia.   Acute on chronic respiratory failure 2/2 CHF exacerbation. Clinically improved today. H/o of HFpEF. At home on torsemide at home 40 mg in the AM, 20 mg at lunch time. Weight improved from 189 lbs on admission to 183 lbs today.  - Daily weights, I/Os - continue atrovent neb - Continue monitoring on IV lasix 60 BID. Spoke with cardiology who recommends continuing IV Lasix for one more dose this evening, then transition to PO tomorrow and patient likely can be DC'd early tomorrow (8/4).  Will consider discharging with PO, torsemide 40 mg BID however will follow up cardiology recs.  - Difficult to assess d/t patient urinary incontinence at baseline, no foley for now.  - echo yesterday (8/2)- EF 55-60%, mild-mod mitral regurg, worsened from previous echo 12/2014.  - daily BMP - cardiology HF team following, appreciate recommendations  Bradycardia, new. HR continues to be low to mid 50s. Discontinued flecainide. - holding metoprolol - appreciate cardiology recommendations  Weakness s/p fall Likely d/t SOB vs deconditioning.Cervical and thoracic xrays neg for fracture. CT head no acute intracranial abnormality.  - PT/OT consulted, appreciate recommendations -SNF placement recommended by PT, social work consulted  Paroxysmal Afibat home on ASA81, metoprolol succinate, xarelto, amiodarone and diltiazem prn if HR >110.  - monitor on  telemetry - continue ASA81, xarelto - hold diltiazem since currently rate controlled.  - Hold metoprolol since continues to be  bradycardic this am - stopped flecainide, had been ordered per [facility] care everywhere research but never taken per patient history   Asymptomatic Pyuria - Patient with organisms, also epithelial cells on UA suggesting it was not a clean catch. UCx pending. Patient previously asymptomatic, however developed mild fever to 100.7 today.   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 Last A1c in care everywhere 7 at unknown date - SSI - monitor CBGs - A1c pending - continue gabapentin  HTNat home on norvasc, diovan, hydralazine, metoprolol succinate, and diltiazem prn HR >110. 132/63 this AM - Monitor BP - continue norvasc, ARB  Anxiety at home oncitalopram - continue home med  Parkinson's diseasehas deep brain stimulator. at home on sinemet, primidone, requip - continue home meds  Fibromyalgia at home on citalopram - continue home med  FEN/GI:SLIV, heart healthy carb modified diet Prophylaxis:on xarelto for Afib   Disposition: DC to SNF tomorrow if patient remains stable  Subjective:  Patient reports improvement in breathing, feels better this AM.  She denies any urinary symptoms.  Objective: Temp:  [97.4 F (36.3 C)-100.7 F (38.2 C)] 100.7 F (38.2 C) (08/03 1124) Pulse Rate:  [50-58] 58 (08/03 1124) Resp:  [16-18] 16 (08/03 1124) BP: (115-141)/(31-63) 141/55 (08/03 1124) SpO2:  [97 %-99 %] 97 % (08/03 1124) Weight:  [83.1 kg (183 lb 4.8 oz)] 83.1 kg (183 lb 4.8 oz) (08/03 0407) Physical Exam: General:  patient sits comfortably in the chair, in no apparent distress Cardiovascular: bradycardic rate, regular rhythm, no murmurs appreciated Respiratory: clear to  auscultation bilaterally, no wheezes rhonchi or rales Abdomen: soft, nontender, nondistended, normoactive BS Extremities: full ROM, warm and well-perfused, 1+ edema bilaterally Neuro: AAOx2 (thought it was September), +parkinsonian tremor  Laboratory:  Recent Labs Lab 10/19/15 1118 10/20/15 0453 10/21/15 0342  WBC 8.5 9.6 7.1  HGB 8.9* 8.3* 8.7*  HCT 29.4* 26.9* 28.8*  PLT 236 231 231    Recent Labs Lab 10/19/15 1118 10/20/15 0453 10/21/15 0342  NA 135 137 139  K 4.1 3.6 4.3  CL 98* 96* 95*  CO2 28 29 32  BUN 77* 68* 73*  CREATININE 2.21* 2.04* 1.93*  CALCIUM 9.5 9.3 9.3  PROT 7.4  --   --   BILITOT 0.8  --   --   ALKPHOS 32*  --   --   ALT 13*  --   --   AST 21  --   --   GLUCOSE 149* 107* 99      Imaging/Diagnostic Tests: CARDIAC ECHO: Study Conclusions  - Left ventricle: The cavity size was normal. Wall thickness was   increased in a pattern of mild LVH. Systolic function was normal.   The estimated ejection fraction was in the range of 55% to 60%.   Doppler parameters are consistent with both elevated ventricular   end-diastolic filling pressure and elevated left atrial filling   pressure. - Aortic valve: Valve area (VTI): 2.02 cm^2. Valve area (Vmax):   1.79 cm^2. Valve area (Vmean): 2.02 cm^2. - Mitral valve: There was mild to moderate regurgitation. Valve   area by pressure half-time: 2.42 cm^2. Valve area by continuity   equation (using LVOT flow): 1.96 cm^2. - Left atrium: The atrium was moderately dilated. - Right ventricle: The cavity size was mildly dilated. - Right atrium: The atrium was mildly dilated. - Atrial septum: No defect or patent foramen ovale was identified. - Pulmonary arteries: PA peak pressure: 69 mm Hg (S).  Everrett Coombe, MD 10/21/2015, 2:59 PM PGY-1, [place] Intern pager: [phone], text pages welcome

## 2015-10-21 NOTE — Care Management Important Message (Signed)
Important Message  Patient Details  Name: Melissa Powers MRN: [id number] Date of Birth: 08/29/1945   Medicare Important Message Given:  Yes    Loann Quill 10/21/2015, 10:49 AM

## 2015-10-21 NOTE — Progress Notes (Signed)
Advanced Heart Failure Rounding Note  Referring Physician: Dr. Ree Kida Primary Physician: Dr. Salvadore Oxford Primary Cardiologist:  Dr. Clayburn Pert  Reason for Consultation: A/C diastolic CHF  Subjective:    Melissa Powers is a 70 y.o. female with PMH of HFpEF, Afib on anticoagulation, CKD Stage 3, T2DM, HTN, HLD, and Parkinson's disease presented with acute on chronic respiratory failure.  Feeling somewhat better today.  Pt worried that SNF will be suggested.  PT consult pending. Peeing well on lasix, urine remains clear/yellow.  Breathing improving.   Out 570 cc, weight shows down 1 lb. Creatinine trending down. K stable.  Objective:   Weight Range: 183 lb 4.8 oz (83.1 kg) Body mass index is 32.47 kg/m.   Vital Signs:   Temp:  [97.4 F (36.3 C)-98.6 F (37 C)] 97.7 F (36.5 C) (08/03 0407) Pulse Rate:  [50-58] 50 (08/03 0407) Resp:  [18] 18 (08/03 0407) BP: (115-132)/(31-63) 132/63 (08/03 0407) SpO2:  [96 %-99 %] 99 % (08/03 0407) Weight:  [183 lb 4.8 oz (83.1 kg)] 183 lb 4.8 oz (83.1 kg) (08/03 0407) Last BM Date: 10/19/15  Weight change: Filed Weights   10/19/15 1545 10/20/15 0519 10/21/15 0407  Weight: 189 lb 14.4 oz (86.1 kg) 184 lb 6.4 oz (83.6 kg) 183 lb 4.8 oz (83.1 kg)    Intake/Output:   Intake/Output Summary (Last 24 hours) at 10/21/15 1048 Last data filed at 10/21/15 0938  Gross per 24 hour  Intake              960 ml  Output             2350 ml  Net            -1390 ml     Physical Exam: General:  Elderly sitting in chair. Mildly SOB with conversation. HEENT: normal Neck: supple. JVP elevated . Carotids 2+ bilat; no bruits. No thyromegaly or lymphadenopathy noted. Cor: PMI nondisplaced. RRR. No rubs, gallops, murmurs.  Lungs: Mildly diminished basilar sounds.  Abdomen: soft, non-tender, non-distended, no HSM. No bruits or masses. +BS  Extremities: no cyanosis, clubbing, rash. 1+ peripheral edema 1/2 to knee.  Neuro: alert & orientedx3,  cranial nerves grossly intact. moves all 4 extremities w/o difficulty. Affect pleasant  Telemetry: NSR  Labs: CBC  Recent Labs  10/19/15 1118 10/20/15 0453 10/21/15 0342  WBC 8.5 9.6 7.1  NEUTROABS 6.7  --   --   HGB 8.9* 8.3* 8.7*  HCT 29.4* 26.9* 28.8*  MCV 91.3 89.4 90.0  PLT 236 231 [id number]   Basic Metabolic Panel  Recent Labs  10/20/15 0453 10/21/15 0342  NA 137 139  K 3.6 4.3  CL 96* 95*  CO2 29 32  GLUCOSE 107* 99  BUN 68* 73*  CREATININE 2.04* 1.93*  CALCIUM 9.3 9.3   Liver Function Tests  Recent Labs  10/19/15 1118  AST 21  ALT 13*  ALKPHOS 32*  BILITOT 0.8  PROT 7.4  ALBUMIN 3.9   No results for input(s): LIPASE, AMYLASE in the last 72 hours. Cardiac Enzymes No results for input(s): CKTOTAL, CKMB, CKMBINDEX, TROPONINI in the last 72 hours.  BNP: BNP (last 3 results)  Recent Labs  10/19/15 1129  BNP 1,057.9*    ProBNP (last 3 results) No results for input(s): PROBNP in the last 8760 hours.   D-Dimer No results for input(s): DDIMER in the last 72 hours. Hemoglobin A1C No results for input(s): HGBA1C in the last 72 hours. Fasting Lipid  Panel No results for input(s): CHOL, HDL, LDLCALC, TRIG, CHOLHDL, LDLDIRECT in the last 72 hours. Thyroid Function Tests  Recent Labs  10/19/15 1623  TSH 2.157    Other results:     Imaging/Studies:  Dg Chest 2 View  Result Date: 10/19/2015 CLINICAL DATA:  Recent falls.  Head injury EXAM: CHEST  2 VIEW COMPARISON:  10/01/2015 FINDINGS: Cardiac enlargement with pulmonary vascular congestion. Prominent interstitial markings suggesting mild interstitial edema. No significant pleural effusion. Implanted generator device in the left chest with leads extending into the left neck unchanged from the prior study. Possible deep brain stimulator. IMPRESSION: Congestive heart failure with mild interstitial edema. Electronically Signed   By: Franchot Gallo M.D.   On: 10/19/2015 12:27   Dg Cervical Spine  Complete  Result Date: 10/19/2015 CLINICAL DATA:  Fall. EXAM: CERVICAL SPINE - COMPLETE 4+ VIEW COMPARISON:  None. FINDINGS: Mild anterior slip at C6-7 likely due to facet degeneration. Disc degeneration and mild spurring at C5-6. Diffuse cervical facet degeneration. Mild foraminal encroachment bilaterally at C5-6 and C6-7 due to spurring. Negative for fracture.  Prevertebral soft tissues normal. IMPRESSION: Cervical disc and facet degeneration. Mild anterior slip at C6-7 felt to be related to facet degeneration. Negative for fracture. Electronically Signed   By: Franchot Gallo M.D.   On: 10/19/2015 12:29   Dg Thoracic Spine 2 View  Result Date: 10/19/2015 CLINICAL DATA:  Fall EXAM: THORACIC SPINE 2 VIEWS COMPARISON:  Lateral chest x-ray 08/12/2015 FINDINGS: Negative for thoracic fracture. Disc degeneration and anterior spurring in the thoracic spine. No bony lesion. Mild dextroscoliosis mid thoracic spine. IMPRESSION: Thoracic degenerative change.  Negative for fracture. Electronically Signed   By: Franchot Gallo M.D.   On: 10/19/2015 12:31   Ct Head Wo Contrast  Result Date: 10/19/2015 CLINICAL DATA:  Multiple falls. EXAM: CT HEAD WITHOUT CONTRAST TECHNIQUE: Contiguous axial images were obtained from the base of the skull through the vertex without intravenous contrast. COMPARISON:  CT scan of July 07, 2015. FINDINGS: Bony calvarium appears intact. Stimulator is seen entering left frontal skull with lead in left thalamus. This is unchanged compared to prior exam. Mild diffuse cortical atrophy is noted. No mass effect or midline shift is noted. Ventricular size is within normal limits. There is no evidence of mass lesion, hemorrhage or acute infarction. IMPRESSION: Stable position of left-sided stimulator lead. Mild diffuse cortical atrophy. No acute intracranial abnormality seen. Electronically Signed   By: Marijo Conception, M.D.   On: 10/19/2015 14:56     Latest Echo  Latest Cath   Medications:       Scheduled Medications: . amLODipine  5 mg Oral Daily  . aspirin EC  81 mg Oral Daily  . atorvastatin  20 mg Oral q1800  . carbidopa-levodopa  1 tablet Oral TID  . citalopram  20 mg Oral Daily  . fenofibrate  160 mg Oral Daily  . furosemide  60 mg Intravenous BID  . gabapentin  100 mg Oral TID  . insulin aspart  0-5 Units Subcutaneous QHS  . insulin aspart  0-9 Units Subcutaneous TID WC  . irbesartan  300 mg Oral Daily  . potassium chloride  20 mEq Oral BID  . Rivaroxaban  15 mg Oral Q supper  . rOPINIRole  0.25 mg Oral TID  . sodium chloride flush  3 mL Intravenous Q12H  . sodium chloride flush  3 mL Intravenous Q12H     Infusions:     PRN Medications:  sodium chloride,  ipratropium, sodium chloride flush   Assessment   1. Acute on chronic respiratory failure 2. Acute on chronic diastolic CHF 3. Paroxysmal Afib 4. AKI on CKD stage III 5. T2DM with neuropathy 6. HTN 7. HLD 8. Anxiety 9. Parkinsons with falls and weakness. 10. UTI  Plan   She is diuresing OK with increased lasix. Continue IV lasix 60 mg BID today. Will consider transition to po in the am.   On rocephin for UTI.   Echo 10/20/15 LVEF 55-60%. Mod LAE, mild RVH and RAE. PA peak pressure 69 mm Hg.   She is at least moderate risk for re-admission, and will need close Heart Failure follow up.  Length of Stay: 2   Shirley Friar PA-C 10/21/2015, 10:48 AM  Advanced Heart Failure Team Pager [phone] (M-F; 7a - 4p)  Please contact [facility] for night-coverage after hours (4p -7a ) and weekends on [email or web address]   Patient seen and examined with Oda Kilts, PA-C. We discussed all aspects of the encounter. I agree with the assessment and plan as stated above.   Volume status remains elevated but improving. Creatinine improved. Continue IV diuresis one more day. She doesn't want to go to SNF if at all possible however PT feels she needs and it may be safest thing for her. According to her  [facility] had been planning to enroll in [facility] program.  Tocarra Gassen,MD 11:33 PM

## 2015-10-21 NOTE — Discharge Summary (Signed)
[facility] Discharge Summary  Patient name: Melissa Powers record number: [id number] Date of birth: 1945/05/13 Age: 70 y.o. Gender: female Date of Admission: 10/19/2015  Date of Discharge: 10/22/15 Admitting Physician: Lupita Dawn, MD  Primary Care Provider: Maris Berger, MD Consultants: Cardiology  Indication for Hospitalization: CHF exacerbation with shortness of breath  Discharge Diagnoses/Problem List:  Acute on chronic respiratory failure in setting of HFpEF CHF exacerbation  Bradycardia Weakness s/p fall Paroxysmal Afib UTI CKD Stage 3 T2DM w/neuropathy HTN Anxiety Parkinson's disease s/p DBS Fibromyalgia  Disposition: Skilled Nursing Facility  Discharge Condition: stable  Discharge Exam:  General: patient sits comfortably in the chair, in no apparent distress Cardiovascular: regular rhythm, no murmurs appreciated Respiratory: clear to auscultation bilaterally, no wheezes rhonchi or rales Abdomen: soft, nontender, nondistended, normoactive BS Extremities: full ROM, warm and well-perfused, no LE edema Neuro: AAOx2 (thought it was September), +parkinsonian tremor   Brief Hospital Course:  #Acute on chronic respiratory failure 2/2 CHFexacerbation  Patient with PMHx of HFpEF on home toresmide was admitted with shortness of breath and treated with IV lasix, which were titrated with recommendations from cardiology heart failure service and the patient improved clinically with diuresis.  Weights improved throughout hospital course as fluid was taken off. She received an echocardiogram that demonstrated ejection fraction of 55-60% with mild-moderate mitral regurgitation.  Patient was discharged on PO diuretics per cardiology recommendations, torsemide 40mg  bid.  #Bradycardia, new. Heart rate found to be low to mid 50s throughout hospitalization.  Metoprolol, flecainide, were held.  #Weakness s/p fall - Weakness was thought to be  secondary to dyspnea vs deconditioning as well as parkinsons disease.  The patient underwent cervical and thoracic xrays which were negative for fracture.  CT head showed no acute intracranial abnormality.  Patient worked with PT and OT during hospital stay, and PT recommended discharge to a skilled nursing facility.  #Paroxysmal Afib - Patient was monitored on telemetry throughout hospitalization.  She was rate controlled so diltiazem, flecainide and metoprolol were held.  Home aspirin and xarelto were continued. Given extensive list of medications from the documented list from her pharmacy, [facility], and in her outside records, there was confusion about her medication regimen so both amiodarone and flecainide were discontinued. Patient remained rate controlled and anticoagulated throughout her hospital stay.  #UTI - Patient had a urinalysis with trace leukocytes, many bacteria, positive nitrites. Patient has urinary incontinence at baseline and had no other urinary symptoms.  On day two of hospitalization the patient developed a mild fever at 100.7 and so rocephin was started. Urine culture showed enterobacter sensitive to rocephin and patient completed 3 days of Rocephin; she was prescribed Ciprofloxacin 250mg  BID for 4 more days to complete 7 days of antibiotics.   #CKD Stage 3  - Patient's creatinine was mildly elevated at 2.01 during hospitalization from baseline 1.1-1.4.  BMP was monitored with the use of diuretics for CHF exacerbation.  The remainder of her chronic medical conditions were stable and changes were made to her home medications.  Issues for Follow Up:  1. Weakness s/p fall - patient to go to skilled nursing facility.  2. Please make sure that patient is on new torsemide regimen of 40mg  bid and please monitor creatinine 3. For paroxysmal Afib: digoxin, amiodarone and flecainide were both held due to medication regiment confusion. Patient later became bradycardiac and metoprolol was  also held. 4. Patient's BP was low in 120s/60s so diltiazem, cardura, hydralazine, and metoprolol were  held. 5. Other medications that were held on DC: hydroxyzine, primidone, savella, trazodone 6. Patient to have 4 more days of antibiotic (ciprofloxacin) for UTI (to complete 7 days total)    Significant Procedures: Cardiac Echo  Significant Labs and Imaging:   Recent Labs Lab 10/20/15 0453 10/21/15 0342 10/22/15 0421  WBC 9.6 7.1 5.9  HGB 8.3* 8.7* 9.1*  HCT 26.9* 28.8* 30.3*  PLT 231 231 267    Recent Labs Lab 10/19/15 1118 10/20/15 0453 10/21/15 0342 10/22/15 0421  NA 135 137 139 136  K 4.1 3.6 4.3 4.3  CL 98* 96* 95* 93*  CO2 28 29 32 34*  GLUCOSE 149* 107* 99 123*  BUN 77* 68* 73* 77*  CREATININE 2.21* 2.04* 1.93* 2.04*  CALCIUM 9.5 9.3 9.3 9.2  ALKPHOS 32*  --   --   --   AST 21  --   --   --   ALT 13*  --   --   --   ALBUMIN 3.9  --   --   --     Urinalysis    Component Value Date/Time   COLORURINE YELLOW 10/19/2015 1340   APPEARANCEUR CLOUDY (A) 10/19/2015 1340   LABSPEC 1.014 10/19/2015 1340   PHURINE 6.0 10/19/2015 1340   GLUCOSEU NEGATIVE 10/19/2015 1340   HGBUR NEGATIVE 10/19/2015 1340   BILIRUBINUR NEGATIVE 10/19/2015 1340   KETONESUR NEGATIVE 10/19/2015 1340   PROTEINUR 30 (A) 10/19/2015 1340   NITRITE POSITIVE (A) 10/19/2015 1340   LEUKOCYTESUR TRACE (A) 10/19/2015 1340   Dg Chest 2 View  Result Date: 10/19/2015 CLINICAL DATA:  Recent falls.  Head injury EXAM: CHEST  2 VIEW COMPARISON:  10/01/2015 FINDINGS: Cardiac enlargement with pulmonary vascular congestion. Prominent interstitial markings suggesting mild interstitial edema. No significant pleural effusion. Implanted generator device in the left chest with leads extending into the left neck unchanged from the prior study. Possible deep brain stimulator. IMPRESSION: Congestive heart failure with mild interstitial edema. Electronically Signed   By: Franchot Gallo M.D.   On: 10/19/2015 12:27    Dg Cervical Spine Complete  Result Date: 10/19/2015 CLINICAL DATA:  Fall. EXAM: CERVICAL SPINE - COMPLETE 4+ VIEW COMPARISON:  None. FINDINGS: Mild anterior slip at C6-7 likely due to facet degeneration. Disc degeneration and mild spurring at C5-6. Diffuse cervical facet degeneration. Mild foraminal encroachment bilaterally at C5-6 and C6-7 due to spurring. Negative for fracture.  Prevertebral soft tissues normal. IMPRESSION: Cervical disc and facet degeneration. Mild anterior slip at C6-7 felt to be related to facet degeneration. Negative for fracture. Electronically Signed   By: Franchot Gallo M.D.   On: 10/19/2015 12:29   Dg Thoracic Spine 2 View  Result Date: 10/19/2015 CLINICAL DATA:  Fall EXAM: THORACIC SPINE 2 VIEWS COMPARISON:  Lateral chest x-ray 08/12/2015 FINDINGS: Negative for thoracic fracture. Disc degeneration and anterior spurring in the thoracic spine. No bony lesion. Mild dextroscoliosis mid thoracic spine. IMPRESSION: Thoracic degenerative change.  Negative for fracture. Electronically Signed   By: Franchot Gallo M.D.   On: 10/19/2015 12:31   Ct Head Wo Contrast  Result Date: 10/19/2015 CLINICAL DATA:  Multiple falls. EXAM: CT HEAD WITHOUT CONTRAST TECHNIQUE: Contiguous axial images were obtained from the base of the skull through the vertex without intravenous contrast. COMPARISON:  CT scan of July 07, 2015. FINDINGS: Bony calvarium appears intact. Stimulator is seen entering left frontal skull with lead in left thalamus. This is unchanged compared to prior exam. Mild diffuse cortical atrophy is noted. No  mass effect or midline shift is noted. Ventricular size is within normal limits. There is no evidence of mass lesion, hemorrhage or acute infarction. IMPRESSION: Stable position of left-sided stimulator lead. Mild diffuse cortical atrophy. No acute intracranial abnormality seen. Electronically Signed   By: Marijo Conception, M.D.   On: 10/19/2015 14:56    Results/Tests Pending at  Time of Discharge: Hemoglobin a1c  Discharge Medications:    Medication List    STOP taking these medications   amitriptyline 100 MG tablet Commonly known as:  ELAVIL   busPIRone 15 MG tablet Commonly known as:  BUSPAR   digoxin 0.125 MG tablet Commonly known as:  LANOXIN   doxazosin 8 MG tablet Commonly known as:  CARDURA   hydrochlorothiazide 25 MG tablet Commonly known as:  HYDRODIURIL   hydrOXYzine 25 MG tablet Commonly known as:  ATARAX/VISTARIL   metoprolol succinate 100 MG 24 hr tablet Commonly known as:  TOPROL-XL   primidone 50 MG tablet Commonly known as:  MYSOLINE   SAVELLA 100 MG Tabs tablet Generic drug:  Milnacipran HCl   traZODone 50 MG tablet Commonly known as:  DESYREL     TAKE these medications   amLODipine 10 MG tablet Commonly known as:  NORVASC Take 5 mg by mouth daily.   aspirin 81 MG EC tablet Take 1 tablet (81 mg total) by mouth daily.   atorvastatin 20 MG tablet Commonly known as:  LIPITOR Take 1 tablet (20 mg total) by mouth daily at 6 PM.   carbidopa-levodopa 25-100 MG tablet Commonly known as:  SINEMET IR Take 1 tablet by mouth 3 (three) times daily.   ciprofloxacin 250 MG tablet Commonly known as:  CIPRO Take 1 tablet (250 mg total) by mouth 2 (two) times daily. Start taking on:  10/23/2015   citalopram 20 MG tablet Commonly known as:  CELEXA Take 20 mg by mouth daily.   fenofibrate 160 MG tablet Take 160 mg by mouth daily.   gabapentin 100 MG capsule Commonly known as:  NEURONTIN Take 100 mg by mouth 3 (three) times daily.   INVOKANA 100 MG Tabs tablet Generic drug:  canagliflozin Take 100 mg by mouth daily.   Rivaroxaban 15 MG Tabs tablet Commonly known as:  XARELTO Take 15 mg by mouth daily with supper.   rOPINIRole 0.25 MG tablet Commonly known as:  REQUIP Take 1 tablet (0.25 mg total) by mouth 3 (three) times daily.   sitaGLIPtin 100 MG tablet Commonly known as:  JANUVIA Take 100 mg by mouth daily.    torsemide 20 MG tablet Commonly known as:  DEMADEX Take 2 tablets (40 mg total) by mouth 2 (two) times daily.   valsartan 320 MG tablet Commonly known as:  DIOVAN Take 320 mg by mouth daily.       Discharge Instructions: Please refer to Patient Instructions section of EMR for full details.  Patient was counseled important signs and symptoms that should prompt return to medical care, changes in medications, dietary instructions, activity restrictions, and follow up appointments.   Follow-Up Appointments: Follow-up Information    Maris Berger, MD. Schedule an appointment as soon as possible for a visit in 1 week(s).   Specialty:  Family Medicine Why:  hospital follow up Contact information: [address] [place] [address] [phone]           Smiley Houseman, MD 10/22/2015, 1:44 PM PGY-1, [place]

## 2015-10-21 NOTE — [place] FL2 (Signed)
[place] MEDICAID FL2 LEVEL OF CARE SCREENING TOOL     IDENTIFICATION  Patient Name: Melissa Powers Birthdate: 1945-06-30 Sex: female Admission Date (Current Location): 10/19/2015  [facility] and [place] Number:  [employer] and Address:  [facility], [place] [address] [place], [place] [address]      Provider Number: [id number]  Attending Physician Name and Address:  Lupita Dawn, MD  Relative Name and Phone Number:       Current Level of Care: Hospital Recommended Level of Care: [place] Prior Approval Number:    Date Approved/Denied:   PASRR Number: Pending  Discharge Plan: SNF    Current Diagnoses: Patient Active Problem List   Diagnosis Date Noted  . Acute CHF (congestive heart failure) ([place]) 10/19/2015  . Acute on chronic congestive heart failure ([place]) 10/19/2015  . Acute kidney injury superimposed on chronic kidney disease ([place])   . Fall   . Acute on chronic respiratory failure with hypoxia ([place])   . Essential hypertension   . Parkinson's disease ([place])     Orientation RESPIRATION BLADDER Height & Weight     Self, Time, Situation, Place  O2 (Nasal Canula 2 L) Continent Weight: 183 lb 4.8 oz (83.1 kg) (scale c) Height:  5\' 3"  (160 cm)  BEHAVIORAL SYMPTOMS/MOOD NEUROLOGICAL BOWEL NUTRITION STATUS   (None)  (Parkinson's) Continent Diet (Heart healthy/carb-modified)  AMBULATORY STATUS COMMUNICATION OF NEEDS Skin   Limited Assist Verbally Other (Comment) (Ecchymosis)                       Personal Care Assistance Level of Assistance              Functional Limitations Info  Sight, Hearing, Speech Sight Info: Adequate Hearing Info: Adequate Speech Info: Adequate    SPECIAL CARE FACTORS FREQUENCY  PT (By licensed PT), Blood pressure     PT Frequency: 5 x week              Contractures Contractures Info: Not present    Additional Factors Info  Code Status, Allergies Code Status Info:  Full Allergies Info: Morphine and related           Current Medications (10/21/2015):  This is the current hospital active medication list Current Facility-Administered Medications  Medication Dose Route Frequency Provider Last Rate Last Dose  . 0.9 %  sodium chloride infusion  250 mL Intravenous PRN Veatrice Bourbon, MD      . acetaminophen (TYLENOL) tablet 650 mg  650 mg Oral Q6H PRN Everrett Coombe, MD   650 mg at 10/21/15 1322  . amLODipine (NORVASC) tablet 5 mg  5 mg Oral Daily Veatrice Bourbon, MD   5 mg at 10/21/15 0937  . aspirin EC tablet 81 mg  81 mg Oral Daily Veatrice Bourbon, MD   81 mg at 10/21/15 0937  . atorvastatin (LIPITOR) tablet 20 mg  20 mg Oral q1800 Veatrice Bourbon, MD   20 mg at 10/20/15 1704  . carbidopa-levodopa (SINEMET IR) 25-100 MG per tablet immediate release 1 tablet  1 tablet Oral TID Veatrice Bourbon, MD   1 tablet at 10/21/15 0937  . cefTRIAXone (ROCEPHIN) 1 g in dextrose 5 % 50 mL IVPB  1 g Intravenous Q24H Rachel L Rumbarger, RPH      . citalopram (CELEXA) tablet 20 mg  20 mg Oral Daily Veatrice Bourbon, MD   20 mg at 10/21/15 0937  .  fenofibrate tablet 160 mg  160 mg Oral Daily Veatrice Bourbon, MD   160 mg at 10/21/15 0937  . furosemide (LASIX) injection 60 mg  60 mg Intravenous BID Jolaine Artist, MD   60 mg at 10/21/15 [id number]  . gabapentin (NEURONTIN) capsule 100 mg  100 mg Oral TID Lovenia Kim, MD   100 mg at 10/21/15 0937  . insulin aspart (novoLOG) injection 0-5 Units  0-5 Units Subcutaneous QHS Alyssa A Haney, MD      . insulin aspart (novoLOG) injection 0-9 Units  0-9 Units Subcutaneous TID WC Veatrice Bourbon, MD   1 Units at 10/21/15 1133  . ipratropium (ATROVENT) nebulizer solution 0.5 mg  0.5 mg Nebulization Q6H PRN Veatrice Bourbon, MD      . irbesartan (AVAPRO) tablet 300 mg  300 mg Oral Daily Veatrice Bourbon, MD   300 mg at 10/21/15 0937  . potassium chloride SA (K-DUR,KLOR-CON) CR tablet 20 mEq  20 mEq Oral BID Jolaine Artist, MD   20 mEq at 10/21/15 [id number]   . Rivaroxaban (XARELTO) tablet 15 mg  15 mg Oral Q supper Veatrice Bourbon, MD   15 mg at 10/20/15 1635  . rOPINIRole (REQUIP) tablet 0.25 mg  0.25 mg Oral TID Veatrice Bourbon, MD   0.25 mg at 10/21/15 0938  . sodium chloride flush (NS) 0.9 % injection 3 mL  3 mL Intravenous Q12H Alyssa A Haney, MD   3 mL at 10/21/15 1000  . sodium chloride flush (NS) 0.9 % injection 3 mL  3 mL Intravenous Q12H Alyssa A Haney, MD   3 mL at 10/21/15 1000  . sodium chloride flush (NS) 0.9 % injection 3 mL  3 mL Intravenous PRN Veatrice Bourbon, MD         Discharge Medications: Please see discharge summary for a list of discharge medications.  Relevant Imaging Results:  Relevant Lab Results:   Additional Information SS#: [id number]  Candie Chroman, LCSW

## 2015-10-21 NOTE — Evaluation (Signed)
Physical Therapy Evaluation Patient Details Name: Melissa Powers MRN: [id number] DOB: October 14, 1945 Today's Date: 10/21/2015   History of Present Illness  Pt is a 70 y/o F who presented with acute on chronic respiratory failure.  She reports she had 4 falls the day prior to admission.  Pt's PMH includes Parkinson's disease, CKD.    Clinical Impression  Pt admitted with above diagnosis. Pt currently with functional limitations due to the deficits listed below (see PT Problem List). Melissa Powers presents with generalized weakness and is quick to fatigue with activity.  PTA pt had aide 3x/wk to assist with bathing, dressing.  Her husband is unable to provide physical assist and pt reports 4 falls the day PTA.  Recommending ST SNF as pt will need to reach mod I level of mobility prior to returning home.  Pt will benefit from skilled PT to increase their independence and safety with mobility to allow discharge to the venue listed below.      Follow Up Recommendations SNF;Supervision for mobility/OOB    Equipment Recommendations  None recommended by PT    Recommendations for Other Services       Precautions / Restrictions Precautions Precautions: Fall Precaution Comments: O2 dependent Restrictions Weight Bearing Restrictions: No      Mobility  Bed Mobility               General bed mobility comments: Pt sitting in recliner chair upon PT arrival  Transfers Overall transfer level: Needs assistance Equipment used: Rolling walker (2 wheeled) Transfers: Sit to/from Stand Sit to Stand: Min guard         General transfer comment: Cues for hand placement and pt rises slowly.  Cues to reach back for armrests to sit.  Ambulation/Gait Ambulation/Gait assistance: Min guard Ambulation Distance (Feet): 70 Feet Assistive device: Rolling walker (2 wheeled) Gait Pattern/deviations: Step-through pattern;Decreased stride length Gait velocity: decreased   General Gait Details: Pt quick to  fatigue, requiring 2 standing rest breaks due to DOE.  SpO2 remains at or above 91% on 2L O2.    Stairs            Wheelchair Mobility    Modified Rankin (Stroke Patients Only)       Balance Overall balance assessment: Needs assistance;History of Falls Sitting-balance support: No upper extremity supported;Feet supported Sitting balance-Leahy Scale: Good     Standing balance support: Bilateral upper extremity supported;During functional activity Standing balance-Leahy Scale: Poor Standing balance comment: Relies on UE support from RW                             Pertinent Vitals/Pain Pain Assessment: No/denies pain    Home Living Family/patient expects to be discharged to:: Private residence Living Arrangements: Spouse/significant other Available Help at Discharge: Family;Available 24 hours/day (96 y/o husband unable to provide physical assist) Type of Home: House Home Access: Stairs to enter Entrance Stairs-Rails: None Entrance Stairs-Number of Steps: 2 Home Layout: One level Home Equipment: Walker - 2 wheels;Cane - single point;Shower seat;Grab bars - toilet;Grab bars - tub/shower;Hand held shower head;Bedside commode Additional Comments: Pt reports that a ramp should be in place soon by lady pt knows who works in [employer].  This should be up in ~1 wk.    Prior Function Level of Independence: Needs assistance   Gait / Transfers Assistance Needed: Ambulates with cane majority of the time but uses RW when she is fatigued  ADL's / [employer]  Needed: Aide 3x/week to assist with bathing and dressing.  Husband and pt do cooking, cleaning.  Husband does the driving.        Hand Dominance   Dominant Hand: Right    Extremity/Trunk Assessment   Upper Extremity Assessment: Defer to OT evaluation           Lower Extremity Assessment: Generalized weakness         Communication   Communication: No difficulties  Cognition  Arousal/Alertness: Awake/alert Behavior During Therapy: WFL for tasks assessed/performed Overall Cognitive Status: Within Functional Limits for tasks assessed                      General Comments      Exercises General Exercises - Lower Extremity Ankle Circles/Pumps: AROM;Both;10 reps;Seated Long Arc Quad: AROM;Both;10 reps;Seated      Assessment/Plan    PT Assessment Patient needs continued PT services  PT Diagnosis Difficulty walking;Generalized weakness   PT Problem List Decreased strength;Decreased activity tolerance;Decreased balance;Decreased safety awareness;Cardiopulmonary status limiting activity  PT Treatment Interventions DME instruction;Gait training;Functional mobility training;Therapeutic activities;Stair training;Therapeutic exercise;Balance training;Patient/family education   PT Goals (Current goals can be found in the Care Plan section) Acute Rehab PT Goals Patient Stated Goal: to get stronger and more independent before returning home PT Goal Formulation: With patient Time For Goal Achievement: 11/04/15 Potential to Achieve Goals: Good    Frequency Min 3X/week   Barriers to discharge Decreased caregiver support Husband not able to provide physical assist if needed    Co-evaluation               End of Session Equipment Utilized During Treatment: Gait belt;Oxygen Activity Tolerance: Patient tolerated treatment well;Patient limited by fatigue Patient left: in chair;with call bell/phone within reach;with chair alarm set Nurse Communication: Mobility status         Time: [id number] PT Time Calculation (min) (ACUTE ONLY): 22 min   Charges:   PT Evaluation $PT Eval Low Complexity: 1 Procedure     PT G Codes:       Collie Siad PT, DPT  Pager: [phone] Phone: [phone] 10/21/2015, 2:07 PM

## 2015-10-22 ENCOUNTER — Other Ambulatory Visit: Payer: Self-pay | Admitting: Family Medicine

## 2015-10-22 DIAGNOSIS — I5033 Acute on chronic diastolic (congestive) heart failure: Secondary | ICD-10-CM

## 2015-10-22 LAB — CBC WITH DIFFERENTIAL/PLATELET
BASOS ABS: 0 10*3/uL (ref 0.0–0.1)
BASOS PCT: 0 %
EOS PCT: 4 %
Eosinophils Absolute: 0.2 10*3/uL (ref 0.0–0.7)
HCT: 30.3 % — ABNORMAL LOW (ref 36.0–46.0)
Hemoglobin: 9.1 g/dL — ABNORMAL LOW (ref 12.0–15.0)
Lymphocytes Relative: 31 %
Lymphs Abs: 1.8 10*3/uL (ref 0.7–4.0)
MCH: 26.8 pg (ref 26.0–34.0)
MCHC: 30 g/dL (ref 30.0–36.0)
MCV: 89.4 fL (ref 78.0–100.0)
MONO ABS: 0.5 10*3/uL (ref 0.1–1.0)
Monocytes Relative: 8 %
Neutro Abs: 3.4 10*3/uL (ref 1.7–7.7)
Neutrophils Relative %: 57 %
PLATELETS: 267 10*3/uL (ref 150–400)
RBC: 3.39 MIL/uL — ABNORMAL LOW (ref 3.87–5.11)
RDW: 14 % (ref 11.5–15.5)
WBC: 5.9 10*3/uL (ref 4.0–10.5)

## 2015-10-22 LAB — URINE CULTURE: Culture: >=100000 — AB

## 2015-10-22 LAB — BASIC METABOLIC PANEL
ANION GAP: 9 (ref 5–15)
BUN: 77 mg/dL — AB (ref 6–20)
CALCIUM: 9.2 mg/dL (ref 8.9–10.3)
CO2: 34 mmol/L — ABNORMAL HIGH (ref 22–32)
Chloride: 93 mmol/L — ABNORMAL LOW (ref 101–111)
Creatinine, Ser: 2.04 mg/dL — ABNORMAL HIGH (ref 0.44–1.00)
GFR calc Af Amer: 27 mL/min — ABNORMAL LOW (ref >60)
GFR, EST NON AFRICAN AMERICAN: 24 mL/min — AB (ref >60)
GLUCOSE: 123 mg/dL — AB (ref 65–99)
Potassium: 4.3 mmol/L (ref 3.5–5.1)
Sodium: 136 mmol/L (ref 135–145)

## 2015-10-22 LAB — GLUCOSE, CAPILLARY
GLUCOSE-CAPILLARY: 113 mg/dL — AB (ref 65–99)
Glucose-Capillary: 100 mg/dL — ABNORMAL HIGH (ref 65–99)
Glucose-Capillary: 158 mg/dL — ABNORMAL HIGH (ref 65–99)

## 2015-10-22 MED ORDER — TORSEMIDE 20 MG PO TABS: 40.0000 mg | ORAL_TABLET | Freq: Two times a day (BID) | ORAL | 0 refills | Status: DC

## 2015-10-22 MED ORDER — ATORVASTATIN CALCIUM 20 MG PO TABS: 20.0000 mg | ORAL_TABLET | Freq: Every day | ORAL | 0 refills | Status: DC

## 2015-10-22 MED ORDER — TORSEMIDE 20 MG PO TABS
40.0000 mg | ORAL_TABLET | Freq: Two times a day (BID) | ORAL | Status: DC
  Administered 2015-10-22: 40 mg via ORAL
  Filled 2015-10-22: qty 2

## 2015-10-22 MED ORDER — CEFTRIAXONE SODIUM 1 G IJ SOLR
1.0000 g | INTRAMUSCULAR | Status: DC
  Administered 2015-10-22: 1 g via INTRAVENOUS
  Filled 2015-10-22: qty 10

## 2015-10-22 MED ORDER — SALINE SPRAY 0.65 % NA SOLN
1.0000 | NASAL | Status: DC | PRN
  Filled 2015-10-22: qty 44

## 2015-10-22 MED ORDER — ASPIRIN 81 MG PO TBEC: 81.0000 mg | DELAYED_RELEASE_TABLET | Freq: Every day | ORAL | Status: DC

## 2015-10-22 MED ORDER — ROPINIROLE HCL 0.25 MG PO TABS: 0.2500 mg | ORAL_TABLET | Freq: Three times a day (TID) | ORAL | 0 refills | Status: DC

## 2015-10-22 MED ORDER — CIPROFLOXACIN HCL 250 MG PO TABS: 250.0000 mg | ORAL_TABLET | Freq: Two times a day (BID) | ORAL | 0 refills | Status: AC

## 2015-10-22 NOTE — Discharge Instructions (Signed)
You were admitted for a heart failure exacerbation. We had to take fluid off of you using medications and changed your home torsemide to 40mg  twice daily. You also had some weakness and it was advised that you received physical therapy at a skilled nursing facility.  Your heart rate was on the low side in the 50s during your hospital stay. We stopped your home amiodarone and metoprolol. Your blood pressure was under good control so we also stopped your home hydralazine.   Heart Failure Heart failure is a condition in which the heart has trouble pumping blood. This means your heart does not pump blood efficiently for your body to work well. In some cases of heart failure, fluid may back up into your lungs or you may have swelling (edema) in your lower legs. Heart failure is usually a long-term (chronic) condition. It is important for you to take good care of yourself and follow your health care provider's treatment plan. CAUSES  Some health conditions can cause heart failure. Those health conditions include:  High blood pressure (hypertension). Hypertension causes the heart muscle to work harder than normal. When pressure in the blood vessels is high, the heart needs to pump (contract) with more force in order to circulate blood throughout the body. High blood pressure eventually causes the heart to become stiff and weak.  Coronary artery disease (CAD). CAD is the buildup of cholesterol and fat (plaque) in the arteries of the heart. The blockage in the arteries deprives the heart muscle of oxygen and blood. This can cause chest pain and may lead to a heart attack. High blood pressure can also contribute to CAD.  Heart attack (myocardial infarction). A heart attack occurs when one or more arteries in the heart become blocked. The loss of oxygen damages the muscle tissue of the heart. When this happens, part of the heart muscle dies. The injured tissue does not contract as well and weakens the heart's  ability to pump blood.  Abnormal heart valves. When the heart valves do not open and close properly, it can cause heart failure. This makes the heart muscle pump harder to keep the blood flowing.  Heart muscle disease (cardiomyopathy or myocarditis). Heart muscle disease is damage to the heart muscle from a variety of causes. These can include drug or alcohol abuse, infections, or unknown reasons. These can increase the risk of heart failure.  Lung disease. Lung disease makes the heart work harder because the lungs do not work properly. This can cause a strain on the heart, leading it to fail.  Diabetes. Diabetes increases the risk of heart failure. High blood sugar contributes to high fat (lipid) levels in the blood. Diabetes can also cause slow damage to tiny blood vessels that carry important nutrients to the heart muscle. When the heart does not get enough oxygen and food, it can cause the heart to become weak and stiff. This leads to a heart that does not contract efficiently.  Other conditions can contribute to heart failure. These include abnormal heart rhythms, thyroid problems, and low blood counts (anemia). Certain unhealthy behaviors can increase the risk of heart failure, including:  Being overweight.  Smoking or chewing tobacco.  Eating foods high in fat and cholesterol.  Abusing illicit drugs or alcohol.  Lacking physical activity. SYMPTOMS  Heart failure symptoms may vary and can be hard to detect. Symptoms may include:  Shortness of breath with activity, such as climbing stairs.  Persistent cough.  Swelling of the feet, ankles,  legs, or abdomen.  Unexplained weight gain.  Difficulty breathing when lying flat (orthopnea).  Waking from sleep because of the need to sit up and get more air.  Rapid heartbeat.  Fatigue and loss of energy.  Feeling light-headed, dizzy, or close to fainting.  Loss of appetite.  Nausea.  Increased urination during the night  (nocturia). DIAGNOSIS  A diagnosis of heart failure is based on your history, symptoms, physical examination, and diagnostic tests. Diagnostic tests for heart failure may include:  Echocardiography.  Electrocardiography.  Chest X-ray.  Blood tests.  Exercise stress test.  Cardiac angiography.  Radionuclide scans. TREATMENT  Treatment is aimed at managing the symptoms of heart failure. Medicines, behavioral changes, or surgical intervention may be necessary to treat heart failure.  Medicines to help treat heart failure may include:  Angiotensin-converting enzyme (ACE) inhibitors. This type of medicine blocks the effects of a blood protein called angiotensin-converting enzyme. ACE inhibitors relax (dilate) the blood vessels and help lower blood pressure.  Angiotensin receptor blockers (ARBs). This type of medicine blocks the actions of a blood protein called angiotensin. Angiotensin receptor blockers dilate the blood vessels and help lower blood pressure.  Water pills (diuretics). Diuretics cause the kidneys to remove salt and water from the blood. The extra fluid is removed through urination. This loss of extra fluid lowers the volume of blood the heart pumps.  Beta blockers. These prevent the heart from beating too fast and improve heart muscle strength.  Digitalis. This increases the force of the heartbeat.  Healthy behavior changes include:  Obtaining and maintaining a healthy weight.  Stopping smoking or chewing tobacco.  Eating heart-healthy foods.  Limiting or avoiding alcohol.  Stopping illicit drug use.  Physical activity as directed by your health care provider.  Surgical treatment for heart failure may include:  A procedure to open blocked arteries, repair damaged heart valves, or remove damaged heart muscle tissue.  A pacemaker to improve heart muscle function and control certain abnormal heart rhythms.  An internal cardioverter defibrillator to treat  certain serious abnormal heart rhythms.  A left ventricular assist device (LVAD) to assist the pumping ability of the heart. HOME CARE INSTRUCTIONS   Take medicines only as directed by your health care provider. Medicines are important in reducing the workload of your heart, slowing the progression of heart failure, and improving your symptoms.  Do not stop taking your medicine unless directed by your health care provider.  Do not skip any dose of medicine.  Refill your prescriptions before you run out of medicine. Your medicines are needed every day.  Engage in moderate physical activity if directed by your health care provider. Moderate physical activity can benefit some people. The elderly and people with severe heart failure should consult with a health care provider for physical activity recommendations.  Eat heart-healthy foods. Food choices should be free of trans fat and low in saturated fat, cholesterol, and salt (sodium). Healthy choices include fresh or frozen fruits and vegetables, fish, lean meats, legumes, fat-free or low-fat dairy products, and whole grain or high fiber foods. Talk to a dietitian to learn more about heart-healthy foods.  Limit sodium if directed by your health care provider. Sodium restriction may reduce symptoms of heart failure in some people. Talk to a dietitian to learn more about heart-healthy seasonings.  Use healthy cooking methods. Healthy cooking methods include roasting, grilling, broiling, baking, poaching, steaming, or stir-frying. Talk to a dietitian to learn more about healthy cooking methods.  Limit fluids  if directed by your health care provider. Fluid restriction may reduce symptoms of heart failure in some people.  Weigh yourself every day. Daily weights are important in the early recognition of excess fluid. You should weigh yourself every morning after you urinate and before you eat breakfast. Wear the same amount of clothing each time you  weigh yourself. Record your daily weight. Provide your health care provider with your weight record.  Monitor and record your blood pressure if directed by your health care provider.  Check your pulse if directed by your health care provider.  Lose weight if directed by your health care provider. Weight loss may reduce symptoms of heart failure in some people.  Stop smoking or chewing tobacco. Nicotine makes your heart work harder by causing your blood vessels to constrict. Do not use nicotine gum or patches before talking to your health care provider.  Keep all follow-up visits as directed by your health care provider. This is important.  Limit alcohol intake to no more than 1 drink per day for nonpregnant women and 2 drinks per day for men. One drink equals 12 ounces of beer, 5 ounces of wine, or 1 ounces of hard liquor. Drinking more than that is harmful to your heart. Tell your health care provider if you drink alcohol several times a week. Talk with your health care provider about whether alcohol is safe for you. If your heart has already been damaged by alcohol or you have severe heart failure, drinking alcohol should be stopped completely.  Stop illicit drug use.  Stay up-to-date with immunizations. It is especially important to prevent respiratory infections through current pneumococcal and influenza immunizations.  Manage other health conditions such as hypertension, diabetes, thyroid disease, or abnormal heart rhythms as directed by your health care provider.  Learn to manage stress.  Plan rest periods when fatigued.  Learn strategies to manage high temperatures. If the weather is extremely hot:  Avoid vigorous physical activity.  Use air conditioning or fans or seek a cooler location.  Avoid caffeine and alcohol.  Wear loose-fitting, lightweight, and light-colored clothing.  Learn strategies to manage cold temperatures. If the weather is extremely cold:  Avoid vigorous  physical activity.  Layer clothes.  Wear mittens or gloves, a hat, and a scarf when going outside.  Avoid alcohol.  Obtain ongoing education and support as needed.  Participate in or seek rehabilitation as needed to maintain or improve independence and quality of life. SEEK MEDICAL CARE IF:   You have a rapid weight gain.  You have increasing shortness of breath that is unusual for you.  You are unable to participate in your usual physical activities.  You tire easily.  You cough more than normal, especially with physical activity.  You have any or more swelling in areas such as your hands, feet, ankles, or abdomen.  You are unable to sleep because it is hard to breathe.  You feel like your heart is beating fast (palpitations).  You become dizzy or light-headed upon standing up. SEEK IMMEDIATE MEDICAL CARE IF:   You have difficulty breathing.  There is a change in mental status such as decreased alertness or difficulty with concentration.  You have a pain or discomfort in your chest.  You have an episode of fainting (syncope). MAKE SURE YOU:   Understand these instructions.  Will watch your condition.  Will get help right away if you are not doing well or get worse.   This information is not intended  to replace advice given to you by your health care provider. Make sure you discuss any questions you have with your health care provider.   Document Released: 03/06/2005 Document Revised: 07/21/2014 Document Reviewed: 04/05/2012 Elsevier Interactive Patient Education [employer].

## 2015-10-22 NOTE — Progress Notes (Signed)
Vest reading =43.

## 2015-10-22 NOTE — Progress Notes (Signed)
Melissa Powers was made a follow-up [facility] appt for 11/04/15 at 1020.

## 2015-10-22 NOTE — Progress Notes (Signed)
Pt has orders to be discharged to [facility] for rehab. Discharge instructions given and pt has no additional questions at this time. Medication regimen reviewed and pt educated. Pt verbalized understanding and has no additional questions. Telemetry box removed. IV removed and site in good condition. Pt stable and waiting for transportation. Report called to [facility].   Maurene Capes RN

## 2015-10-22 NOTE — Progress Notes (Signed)
Heart Failure Navigator Consult Note  Presentation: Melissa Powers is a 70 y.o. female presenting with shortness of breath . PMH is significant for HFpEF, Afib, CKD Stage 3, T2DM, HTN, HLD, anxiety, Parkinson's disease, fibromyalgia.   Past Medical History:  Diagnosis Date  . Anxiety   . Atrial fibrillation ([place])   . CHF (congestive heart failure) ([place])   . Chronic kidney disease (CKD), stage III (moderate)   . COPD (chronic obstructive pulmonary disease) ([place])   . History of home oxygen therapy    "2.5L; 24/7" (10/19/2015)  . Hyperlipidemia   . Hypertension   . Pneumonia 08/2015  . Sleep apnea    "tried mask; can't wear it" (10/19/2015)  . Tremor   . Type II diabetes mellitus ([place])     Social History   Social History  . Marital status: Married    Spouse name: N/A  . Number of children: N/A  . Years of education: N/A   Social History Main Topics  . Smoking status: Never Smoker  . Smokeless tobacco: Never Used  . Alcohol use No  . Drug use: No  . Sexual activity: Not Currently   Other Topics Concern  . None   Social History Narrative  . None    ECHO:Study Conclusions-10/20/15  - Left ventricle: The cavity size was normal. Wall thickness was   increased in a pattern of mild LVH. Systolic function was normal.   The estimated ejection fraction was in the range of 55% to 60%.   Doppler parameters are consistent with both elevated ventricular   end-diastolic filling pressure and elevated left atrial filling   pressure. - Aortic valve: Valve area (VTI): 2.02 cm^2. Valve area (Vmax):   1.79 cm^2. Valve area (Vmean): 2.02 cm^2. - Mitral valve: There was mild to moderate regurgitation. Valve   area by pressure half-time: 2.42 cm^2. Valve area by continuity   equation (using LVOT flow): 1.96 cm^2. - Left atrium: The atrium was moderately dilated. - Right ventricle: The cavity size was mildly dilated. - Right atrium: The atrium was mildly dilated. - Atrial septum: No defect  or patent foramen ovale was identified. - Pulmonary arteries: PA peak pressure: 69 mm Hg (S).  ------------------------------------------------------------------- Study data:   Study status:  Routine.  Study completion:  There were no complications.          Transthoracic echocardiography. M-mode, complete 2D, spectral Doppler, and color Doppler. Birthdate:  Patient birthdate: 1945-12-13.  Age:  Patient is 70 yr old.  Sex:  Gender: female.    BMI: 32.7 kg/m^2.  Blood pressure:   115/38  Patient status:  Inpatient.  Study date:  Study date: 10/20/2015. Study time: 11:18 AM.  Location:  Echo laboratory.  BNP    Component Value Date/Time   BNP 1,057.9 (H) 10/19/2015 1129    ProBNP No results found for: PROBNP   Education Assessment and Provision:  Detailed education and instructions provided on heart failure disease management including the following:  Signs and symptoms of Heart Failure When to call the physician Importance of daily weights Low sodium diet Fluid restriction Medication management Anticipated future follow-up appointments  Patient education given on each of the above topics.  Patient acknowledges understanding and acceptance of all instructions.  I spoke with [name] regarding her HF and current hospitalization.  She tells me that she weighs each day at home.  We discussed the importance of daily weights and how weight increases relate to the signs and symptoms of HF.  She admits that she is "trying" to watch her sodium and is reading labels.  She denies any problems getting medications currently however does admit that she sometimes has issues financially.  I will send a referral to Outpatient AHF LCSW to investigate any possible assistance available.  She will follow with the AHF Clinic after discharge.     Education Materials:  "Living Better With Heart Failure" Booklet, Daily Weight Tracker Tool   High Risk Criteria for Readmission and/or Poor Patient  Outcomes:   EF <30%- No 55-60%  2 or more admissions in 6 months- No  Difficult social situation- No  Demonstrates medication noncompliance- Denies    Barriers of Care:  Knowledge and compliance  Discharge Planning:  Plans to return home or possibly SNF.  She would like to follow-up with AHF Clinic after discharge.  She has had a HHRN at home whom she finds to be very helpful.

## 2015-10-22 NOTE — Progress Notes (Signed)
Advanced Heart Failure Rounding Note  Referring Physician: Dr. Ree Kida Primary Physician: Dr. Salvadore Oxford Primary Cardiologist:  Dr. Clayburn Pert  Reason for Consultation: A/C diastolic CHF  Subjective:    Diuresing with IV lasix. Weight down another couple pounds.   Denies SOB.   Objective:   Weight Range: 181 lb 7 oz (82.3 kg) Body mass index is 32.14 kg/m.   Vital Signs:   Temp:  [98.5 F (36.9 C)-100.7 F (38.2 C)] 98.6 F (37 C) (08/04 0640) Pulse Rate:  [58-64] 64 (08/04 0640) Resp:  [16-18] 18 (08/04 0640) BP: (124-141)/(55-82) 124/68 (08/04 0640) SpO2:  [95 %-97 %] 96 % (08/04 0640) Weight:  [181 lb 7 oz (82.3 kg)] 181 lb 7 oz (82.3 kg) (08/04 0640) Last BM Date: 10/21/15  Weight change: Filed Weights   10/20/15 0519 10/21/15 0407 10/22/15 0640  Weight: 184 lb 6.4 oz (83.6 kg) 183 lb 4.8 oz (83.1 kg) 181 lb 7 oz (82.3 kg)    Intake/Output:   Intake/Output Summary (Last 24 hours) at 10/22/15 [id number] Last data filed at 10/22/15 [id number]  Gross per 24 hour  Intake             1060 ml  Output             1101 ml  Net              -41 ml     Physical Exam: General:  Elderly in the bed.  HEENT: normal Neck: supple. JVP 6-7. Carotids 2+ bilat; no bruits. No thyromegaly or lymphadenopathy noted. Cor: PMI nondisplaced. RRR. No rubs, gallops, murmurs.  Lungs: Mildly diminished basilar sounds.  Abdomen: soft, non-tender, non-distended, no HSM. No bruits or masses. +BS  Extremities: no cyanosis, clubbing, rash. Trace -1+ edema.   Neuro: alert & orientedx3, cranial nerves grossly intact. moves all 4 extremities w/o difficulty. Affect pleasant  Telemetry: NSR  Labs: CBC  Recent Labs  10/19/15 1118  10/21/15 0342 10/22/15 0421  WBC 8.5  < > 7.1 5.9  NEUTROABS 6.7  --   --  3.4  HGB 8.9*  < > 8.7* 9.1*  HCT 29.4*  < > 28.8* 30.3*  MCV 91.3  < > 90.0 89.4  PLT 236  < > 231 267  < > = values in this interval not displayed. Basic Metabolic  Panel  Recent Labs  10/21/15 0342 10/22/15 0421  NA 139 136  K 4.3 4.3  CL 95* 93*  CO2 32 34*  GLUCOSE 99 123*  BUN 73* 77*  CREATININE 1.93* 2.04*  CALCIUM 9.3 9.2   Liver Function Tests  Recent Labs  10/19/15 1118  AST 21  ALT 13*  ALKPHOS 32*  BILITOT 0.8  PROT 7.4  ALBUMIN 3.9   No results for input(s): LIPASE, AMYLASE in the last 72 hours. Cardiac Enzymes No results for input(s): CKTOTAL, CKMB, CKMBINDEX, TROPONINI in the last 72 hours.  BNP: BNP (last 3 results)  Recent Labs  10/19/15 1129  BNP 1,057.9*    ProBNP (last 3 results) No results for input(s): PROBNP in the last 8760 hours.   D-Dimer No results for input(s): DDIMER in the last 72 hours. Hemoglobin A1C No results for input(s): HGBA1C in the last 72 hours. Fasting Lipid Panel No results for input(s): CHOL, HDL, LDLCALC, TRIG, CHOLHDL, LDLDIRECT in the last 72 hours. Thyroid Function Tests  Recent Labs  10/19/15 1623  TSH 2.157    Other results:     Imaging/Studies:  No results found.  Latest Echo  Latest Cath   Medications:     Scheduled Medications: . amLODipine  5 mg Oral Daily  . aspirin EC  81 mg Oral Daily  . atorvastatin  20 mg Oral q1800  . carbidopa-levodopa  1 tablet Oral TID  . cefTRIAXone (ROCEPHIN)  IV  1 g Intravenous Q24H  . citalopram  20 mg Oral Daily  . fenofibrate  160 mg Oral Daily  . furosemide  60 mg Intravenous BID  . gabapentin  100 mg Oral TID  . insulin aspart  0-5 Units Subcutaneous QHS  . insulin aspart  0-9 Units Subcutaneous TID WC  . irbesartan  300 mg Oral Daily  . potassium chloride  20 mEq Oral BID  . Rivaroxaban  15 mg Oral Q supper  . rOPINIRole  0.25 mg Oral TID  . sodium chloride flush  3 mL Intravenous Q12H  . sodium chloride flush  3 mL Intravenous Q12H    Infusions:    PRN Medications: sodium chloride, acetaminophen, ipratropium, sodium chloride flush   Assessment   1. Acute on chronic respiratory failure 2.  Acute on chronic diastolic CHF 3. Paroxysmal Afib 4. AKI on CKD stage III 5. T2DM with neuropathy 6. HTN 7. HLD 8. Anxiety 9. Parkinsons with falls and weakness. 10. UTI  Plan   Volume status improved.  Stop IV lasix. Start torsemide 40 mg twice a day. Renal function ok.   Echo 10/20/15 LVEF 55-60%. Mod LAE, mild RVH and RAE. PA peak pressure 69 mm Hg.   Place VEST    Length of Stay: 3   Amy Clegg NP-C  10/22/2015, 9:22 AM  Advanced Heart Failure Team Pager [phone] (M-F; 7a - 4p)  Please contact [facility] for night-coverage after hours (4p -7a ) and weekends on [email or web address]  Patient seen and examined with Darrick Grinder, NP. We discussed all aspects of the encounter. I agree with the assessment and plan as stated above.   Volume status looks good on exam. Renal function back up slightly. Primary team planning discharge to SNF. Would resume previous torsemide dosing 80/40.   Acelynn Dejonge,MD 5:41 PM  Addendum: VEST reading = 43% suggests residual volume overload. Will need close outpatient f/u in HF clinic.   Milas Schappell,MD 5:42 PM

## 2015-10-22 NOTE — Clinical Social Work Note (Signed)
Clinical Social Work Assessment  Patient Details  Name: BRIZA FLUDD MRN: [id number] Date of Birth: Feb 23, 1946  Date of referral:  10/21/15               Reason for consult:  Facility Placement                Permission sought to share information with:  Family Supports, Other Permission granted to share information::     Name::     Chaunda Wassink  Agency::     Relationship::  Husband  Contact Information:  [phone]  Housing/Transportation Living arrangements for the past 2 months:  Single Family Home Source of Information:  Patient Patient Interpreter Needed:  None Criminal Activity/Legal Involvement Pertinent to Current Situation/Hospitalization:    Significant Relationships:  Spouse Lives with:  Spouse Do you feel safe going back to the place where you live?  No Need for family participation in patient care:  Yes (Comment)  Care giving concerns:  Patient in agreement with ST rehab for strengthening.   Social Worker assessment / plan:  Patient previously assessed by SW Charlynn Grimes and agreed to SNF placement. Talked with patient at bedside regarding transport called. Patient was sitting up on the bed and thanked CSW for information regarding transport and her room number at [facility],. Patient contacted her husband to inform him of room number at facility.  Employment status:  Retired Health visitor, Managed Care PT Recommendations:  [place] / Referral to community resources:  [place]  Patient/Family's Response to care: No concerns expressed regarding care during hospitalization.  Patient/Family's Understanding of and Emotional Response to Diagnosis, Current Treatment, and Prognosis:  Not discussed.  Emotional Assessment Appearance:  Appears stated age Attitude/Demeanor/Rapport:  Other (Appropriate) Affect (typically observed):  Appropriate, Pleasant Orientation:  Oriented to Self, Oriented to Situation, Oriented  to  Time, Oriented to Place Alcohol / Substance use:  Never Used Psych involvement (Current and /or in the community):     Discharge Needs  Concerns to be addressed:  No discharge needs identified Readmission within the last 30 days:  No Current discharge risk:  None Barriers to Discharge:  No Barriers Identified   Sable Feil, LCSW 10/22/2015, 6:33 PM

## 2015-10-22 NOTE — Consult Note (Signed)
   [facility] Inpatient Consult   10/22/2015  Melissa Powers 07-20-1945 [id number]  Patient screened for potential [facility] services. Patient is eligible for [facility]. Electronic medical record reveals patient's current  discharge plan is for skilled nursing facility.  [facility] Care Management services will not follow at this time.  If patient's post hospital needs change please place a [facility] Care Management consult. For questions please contact:   Natividad Brood, RN BSN [facility] Liaison  [phone] business mobile phone Toll free office [phone]

## 2015-10-22 NOTE — Clinical Social Work Placement (Signed)
   CLINICAL SOCIAL WORK PLACEMENT  NOTE 10/22/15 - DISCHARGED TO [facility]  Date:  10/22/2015  Patient Details  Name: Melissa Powers MRN: [id number] Date of Birth: [birth year]  Clinical Social Work is seeking post-discharge placement for this patient at the [place] level of care (*CSW will initial, date and re-position this form in  chart as items are completed):  Yes   Patient/family provided with [facility] Work Department's list of facilities offering this level of care within the geographic area requested by the patient (or if unable, by the patient's family).  Yes   Patient/family informed of their freedom to choose among providers that offer the needed level of care, that participate in Medicare, Medicaid or managed care program needed by the patient, have an available bed and are willing to accept the patient.  Yes   Patient/family informed of [facility]'s ownership interest in [facility] and [facility], as well as of the fact that they are under no obligation to receive care at these facilities.  PASRR submitted to EDS on 10/21/15     PASRR number received on 10/21/15     Existing PASRR number confirmed on       FL2 transmitted to all facilities in geographic area requested by pt/family on 10/21/15     FL2 transmitted to all facilities within larger geographic area on       Patient informed that his/her managed care company has contracts with or will negotiate with certain facilities, including the following:        Yes   Patient/family informed of bed offers received.  Patient chooses bed at [facility]     Physician recommends and patient chooses bed at      Patient to be transferred to [facility] on 10/22/15.  Patient to be transferred to facility by Ambulance     Patient family notified on 10/22/15 of transfer.  Name of family member notified:  Brittoni Aland, contacted by patient while CSW at bedside.      PHYSICIAN       Additional Comment:    _______________________________________________ Sable Feil, LCSW 10/22/2015, 6:40 PM

## 2015-10-22 NOTE — Evaluation (Signed)
Occupational Therapy Re-Evaluation Patient Details Name: Melissa Powers MRN: [id number] DOB: 1945-11-18 Today's Date: 10/22/2015    History of Present Illness Pt is a 70 y/o F who presented with acute on chronic respiratory failure.  She reports she had 4 falls the day prior to admission.  Pt's PMH includes Parkinson's disease, CKD.   Clinical Impression   Pt admitted with above. She demonstrates the below listed deficits and will benefit from continued OT to maximize safety and independence with BADLs.  She currently requires min guard assist for ADLs and is at high risk for falls.  Recommend SNF.       Follow Up Recommendations  SNF    Equipment Recommendations  None recommended by OT    Recommendations for Other Services       Precautions / Restrictions Precautions Precautions: Fall Precaution Comments: O2 dependent      Mobility Bed Mobility                  Transfers Overall transfer level: Needs assistance Equipment used: Rolling walker (2 wheeled) Transfers: Sit to/from [facility] Sit to Stand: Min guard Stand pivot transfers: Min guard       General transfer comment: cues for hand placement and walker safety     Balance Overall balance assessment: Needs assistance Sitting-balance support: Feet supported Sitting balance-Leahy Scale: Good     Standing balance support: During functional activity Standing balance-Leahy Scale: Fair                              ADL Overall ADL's : Needs assistance/impaired Eating/Feeding: Independent   Grooming: Wash/dry hands;Wash/dry face;Oral care;Min guard;Standing   Upper Body Bathing: Set up;Sitting   Lower Body Bathing: Min guard;Sit to/from stand   Upper Body Dressing : Set up;Sitting   Lower Body Dressing: Min guard;Sit to/from stand   Toilet Transfer: Min guard;Ambulation;Comfort height toilet;RW   Toileting- Water quality scientist and Hygiene: Min guard;Sit to/from  stand       Functional mobility during ADLs: Min guard;Rolling walker General ADL Comments: Pt requires cues for walker safety - as she approaches seat, sink, etc, she pushes RW to the side and ambulates last 2-3 ft without it.  Requires cues for hand placement      Vision     Perception     Praxis      Pertinent Vitals/Pain Pain Assessment: No/denies pain     Hand Dominance Right   Extremity/Trunk Assessment Upper Extremity Assessment Upper Extremity Assessment: Generalized weakness   Lower Extremity Assessment Lower Extremity Assessment: Defer to PT evaluation       Communication Communication Communication: No difficulties   Cognition Arousal/Alertness: Awake/alert Behavior During Therapy: WFL for tasks assessed/performed Overall Cognitive Status: Within Functional Limits for tasks assessed                     General Comments       Exercises       Shoulder Instructions      Home Living Family/patient expects to be discharged to:: Skilled nursing facility Living Arrangements: Spouse/significant other Available Help at Discharge: Family;Available 24 hours/day (8 y/o husband unable to provide physical assist) Type of Home: House Home Access: Stairs to enter [employer] of Steps: 2 Entrance Stairs-Rails: None Home Layout: One level     Bathroom Shower/Tub: Tub/shower unit;Door   [employer] Toilet: Standard     Home Equipment: Environmental consultant - 2  wheels;Cane - single point;Shower seat;Grab bars - toilet;Grab bars - tub/shower;Hand held shower head;Bedside commode   Additional Comments: Pt reports that a ramp should be in place soon by lady pt knows who works in [employer].  This should be up in ~1 wk.      Prior Functioning/Environment Level of Independence: Needs assistance  Gait / Transfers Assistance Needed: Ambulates with cane majority of the time but uses RW when she is fatigued ADL's / Homemaking Assistance Needed: Aide 3x/week to  assist with bathing and dressing.  Husband and pt do cooking, cleaning.  Husband does the driving.   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    OT Diagnosis: Generalized weakness   OT Problem List: Decreased strength;Decreased activity tolerance;Impaired balance (sitting and/or standing);Decreased coordination;Decreased knowledge of use of DME or AE;Cardiopulmonary status limiting activity   OT Treatment/Interventions: Self-care/ADL training;DME and/or AE instruction;Therapeutic activities;Patient/family education;Balance training    OT Goals(Current goals can be found in the care plan section) Acute Rehab OT Goals Patient Stated Goal: to get my life back  OT Goal Formulation: With patient Time For Goal Achievement: 11/05/15 Potential to Achieve Goals: Good ADL Goals Pt Will Perform Grooming: with modified independence;standing Pt Will Perform Upper Body Bathing: with modified independence;sitting Pt Will Perform Lower Body Bathing: with modified independence;sit to/from stand Pt Will Perform Upper Body Dressing: with modified independence;sitting Pt Will Perform Lower Body Dressing: with modified independence;sit to/from stand Pt Will Transfer to Toilet: with modified independence;ambulating;regular height toilet Pt Will Perform Toileting - Clothing Manipulation and hygiene: with modified independence;sit to/from stand Pt/caregiver will Perform Home Exercise Program: Increased strength;Right Upper extremity;With theraband  OT Frequency: Min 2X/week   Barriers to D/C: Decreased caregiver support          Co-evaluation              End of Session Equipment Utilized During Treatment: Oxygen Nurse Communication: Mobility status  Activity Tolerance: Patient tolerated treatment well Patient left: in chair;with call bell/phone within reach   Time: 1157-1216 OT Time Calculation (min): 19 min Charges:  OT General  Charges $OT Visit: 1 Procedure OT Evaluation $OT Re-eval: 1 Procedure G-Codes:    Lucille Passy M 11-08-15, 12:54 PM

## 2015-10-22 NOTE — Progress Notes (Signed)
Occupational Therapy Treatment Patient Details Name: Melissa Powers MRN: [id number] DOB: 30-Jun-1945 Today's Date: 10/22/2015    History of present illness Pt is a 70 y/o F who presented with acute on chronic respiratory failure.  She reports she had 4 falls the day prior to admission.  Pt's PMH includes Parkinson's disease, CKD.   OT comments  Pt fatigues with activity, but is very motivated.    Follow Up Recommendations  SNF    Equipment Recommendations  None recommended by OT    Recommendations for Other Services      Precautions / Restrictions Precautions Precautions: Fall Precaution Comments: O2 dependent       Mobility Bed Mobility                  Transfers Overall transfer level: Needs assistance Equipment used: Rolling walker (2 wheeled) Transfers: Sit to/from [facility] Sit to Stand: Min guard Stand pivot transfers: Min guard       General transfer comment: cues for walker safety     Balance     Sitting balance-Leahy Scale: Good     Standing balance support: During functional activity Standing balance-Leahy Scale: Fair                     ADL                           Toilet Transfer: Min guard;Ambulation;Comfort height toilet;RW   Toileting- Water quality scientist and Hygiene: Min guard;Sit to/from stand         General ADL Comments: Pt fatigues fairly rapidly with activity DOE 2/4-3/4       Vision                     Perception     Praxis      Cognition   Behavior During Therapy: WFL for tasks assessed/performed Overall Cognitive Status: Within Functional Limits for tasks assessed                       Extremity/Trunk Assessment               Exercises     Shoulder Instructions       General Comments      Pertinent Vitals/ Pain          Home Living                                          Prior Functioning/Environment               Frequency Min 2X/week     Progress Toward Goals  OT Goals(current goals can now be found in the care plan section)     ADL Goals Pt Will Perform Grooming: with modified independence;standing Pt Will Perform Upper Body Bathing: with modified independence;sitting Pt Will Perform Lower Body Bathing: with modified independence;sit to/from stand Pt Will Perform Upper Body Dressing: with modified independence;sitting Pt Will Perform Lower Body Dressing: with modified independence;sit to/from stand Pt Will Transfer to Toilet: with modified independence;ambulating;regular height toilet Pt Will Perform Toileting - Clothing Manipulation and hygiene: with modified independence;sit to/from stand Pt/caregiver will Perform Home Exercise Program: Increased strength;Right Upper extremity;With theraband  Plan Discharge plan remains appropriate    Co-evaluation  End of Session Equipment Utilized During Treatment: Oxygen   Activity Tolerance Patient tolerated treatment well   Patient Left in chair;with call bell/phone within reach   Nurse Communication Mobility status        Time: 1701 (1701)-     Charges: OT General Charges $OT Visit: 1 Procedure OT Treatments $Therapeutic Activity: 8-22 mins  Brianne Maina M 10/22/2015, 5:36 PM

## 2015-10-22 NOTE — Progress Notes (Signed)
Family Medicine Teaching Service Daily Progress Note Intern Pager: [phone]  Patient name: Melissa Powers record number: [id number] Date of birth: 1945/06/06 Age: 70 y.o. Gender: female  Primary Care Provider: Maris Berger, MD Consultants: none  Code Status: FULL  Pt Overview and Major Events to Date:  8/1 Admitted for CHF exacerbation  Assessment and Plan: Melissa Powers a 70 y.o.femalepresenting with shortness of breath. PMH is significant for HFpEF, Afib on xarelto, CKD Stage 3, T2DM, HTN, HLD, anxiety, Parkinson's disease, fibromyalgia.  Acute on chronic respiratory failure 2/2 CHF exacerbation. Clinically improved today. H/o of HFpEF Echo (8/2)- EF 55-60%, mild-mod mitral regurg At home on torsemide at home 40 mg in the AM, 20 mg at lunch time. Weight improved from 189 lbs on admission to 181 lbs today.  - Daily weights, I/Os - continue atrovent neb - Per cardiology HF team, transition to po torsemide 40mg  bid - Difficult to assess d/t patient urinary incontinence at baseline, no foley for now.  - daily BMP  Bradycardia, new. HR continues to be low to mid 50s. Discontinued flecainide. - holding metoprolol - appreciate cardiology recommendations  Weakness s/p fall Likely d/t SOB vs deconditioning.Cervical and thoracic xrays neg for fracture. CT head no acute intracranial abnormality.  - PT/OT consulted, appreciate recommendations -SNF placement recommended by PT, social work consulted  Paroxysmal Afibat home on ASA81, metoprolol succinate, xarelto, amiodarone and diltiazem prn if HR >110.  - monitor on telemetry - continue ASA81, xarelto - hold diltiazem since currently rate controlled.  - Holding metoprolol 2/2 bradycardia - stopped flecainide, had been ordered per [facility] care everywhere research but never taken per patient history  UTI  Patient asymptomatic, urinary incontinence at baseline. however febrile to Tmax 100.7 Urine culture prelim >100,000 colonies  Gram neg rods, sensitivities - continue rocephin, last dose today.  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 Last A1c in care everywhere 7 at unknown date - SSI - monitor CBGs - A1c pending - continue gabapentin  HTNat home on norvasc, diovan, hydralazine, metoprolol succinate, and diltiazem prn HR >110. 124/68 this AM - Monitor BP - continue norvasc, ARB  Anxiety at home oncitalopram - continue home med  Parkinson's diseasehas deep brain stimulator. at home on sinemet, primidone, requip - continue home meds  Fibromyalgia at home on citalopram - continue home med  FEN/GI:SLIV, heart healthy carb modified diet Prophylaxis:on xarelto for Afib   Disposition: DC to SNF today   Subjective:   Patient reports improvement in breathing, feels better this AM, thinks breathing is at baseline. States no longer gets SOB with speech and her LE edema has resolved.  She denies any urinary symptoms but states her previous UTIs have also been asymptomatic except for her chronic urinary incontinence. States does not want to go to SNF because thinks will affect her financially and is scared of losing her insurance so would rather go home with home PT. States if did not financial concerns would gladly go to SNF.  Objective: Temp:  [98.5 F (36.9 C)-100.7 F (38.2 C)] 98.5 F (36.9 C) (08/03 2050) Pulse Rate:  [58-60] 60 (08/03 2050) Resp:  [16-17] 17 (08/03 2050) BP: (133-141)/(55-82) 133/82 (08/03 2050) SpO2:  [95 %-97 %] 95 % (08/03 2050) Physical Exam:  General: patient sits comfortably in the chair, in no apparent distress Cardiovascular: regular rhythm, no murmurs appreciated Respiratory: clear to auscultation  bilaterally, no wheezes rhonchi or rales Abdomen: soft, nontender, nondistended, normoactive BS Extremities: full ROM, warm and  well-perfused, no LE edema Neuro: AAOx2 (thought it was September), +parkinsonian tremor  Laboratory:  Recent Labs Lab 10/20/15 0453 10/21/15 0342 10/22/15 0421  WBC 9.6 7.1 5.9  HGB 8.3* 8.7* 9.1*  HCT 26.9* 28.8* 30.3*  PLT 231 231 267    Recent Labs Lab 10/19/15 1118 10/20/15 0453 10/21/15 0342 10/22/15 0421  NA 135 137 139 136  K 4.1 3.6 4.3 4.3  CL 98* 96* 95* 93*  CO2 28 29 32 34*  BUN 77* 68* 73* 77*  CREATININE 2.21* 2.04* 1.93* 2.04*  CALCIUM 9.5 9.3 9.3 9.2  PROT 7.4  --   --   --   BILITOT 0.8  --   --   --   ALKPHOS 32*  --   --   --   ALT 13*  --   --   --   AST 21  --   --   --   GLUCOSE 149* 107* 99 123*      Imaging/Diagnostic Tests: CARDIAC ECHO: Study Conclusions  - Left ventricle: The cavity size was normal. Wall thickness was increased in a pattern of mild LVH. Systolic function was normal.  The estimated ejection fraction was in the range of 55% to 60%. Doppler parameters are consistent with both elevated ventricular end-diastolic filling pressure and elevated left atrial filling pressure. - Aortic valve: Valve area (VTI): 2.02 cm^2. Valve area (Vmax): 1.79 cm^2. Valve area (Vmean): 2.02 cm^2. - Mitral valve: There was mild to moderate regurgitation. Valve area by pressure half-time: 2.42 cm^2. Valve area by continuity equation (using LVOT flow): 1.96 cm^2. - Left atrium: The atrium was moderately dilated. - Right ventricle: The cavity size was mildly dilated. - Right atrium: The atrium was mildly dilated. - Atrial septum: No defect or patent foramen ovale was identified. - Pulmonary arteries: PA peak pressure: 69 mm Hg (S).  Bufford Lope, DO 10/22/2015, 6:15 AM PGY-1, [place] Intern pager: [phone], text pages welcome

## 2015-10-22 NOTE — Clinical Social Work Note (Signed)
CSW met with patient to clear up concerns about insurance and SNF. CSW presented bed offers and she accepted at [facility]. Patient's IV Lasix is being discontinued today so CSW called and left voicemail for MD to find out when she will discharge. Patient will likely need discharge summary in today if discharging over the weekend so that SNF can get her medications together. CSW called and left voicemail for Darien Ramus, admissions coordinator at [facility].  Dayton Scrape, [place]

## 2015-10-23 LAB — HEMOGLOBIN A1C
Hgb A1c MFr Bld: 6.3 % — ABNORMAL HIGH (ref 4.8–5.6)
Mean Plasma Glucose: 134 mg/dL

## 2015-11-02 ENCOUNTER — Telehealth (HOSPITAL_COMMUNITY): Payer: Self-pay | Admitting: Vascular Surgery

## 2015-11-02 NOTE — Telephone Encounter (Signed)
Spoke to pt husband, pt appt was cancel through automated service, pt is in nursing facility pt will call when she can reschedule per husband

## 2015-11-04 ENCOUNTER — Encounter (HOSPITAL_COMMUNITY): Payer: Medicare Other

## 2015-11-09 ENCOUNTER — Other Ambulatory Visit (HOSPITAL_COMMUNITY): Payer: Self-pay | Admitting: Student

## 2015-11-09 ENCOUNTER — Ambulatory Visit (HOSPITAL_COMMUNITY)
Admission: RE | Admit: 2015-11-09 | Discharge: 2015-11-09 | Disposition: A | Discharge status: Home / Self Care | Payer: Medicare Other | Source: Ambulatory Visit | Attending: Cardiology | Admitting: Cardiology

## 2015-11-09 VITALS — BP 110/50 | HR 55 | Wt 188.6 lb

## 2015-11-09 DIAGNOSIS — I10 Essential (primary) hypertension: Secondary | ICD-10-CM | POA: Diagnosis present

## 2015-11-09 DIAGNOSIS — Z7984 Long term (current) use of oral hypoglycemic drugs: Secondary | ICD-10-CM | POA: Diagnosis not present

## 2015-11-09 DIAGNOSIS — Z79899 Other long term (current) drug therapy: Secondary | ICD-10-CM | POA: Diagnosis not present

## 2015-11-09 DIAGNOSIS — J9621 Acute and chronic respiratory failure with hypoxia: Secondary | ICD-10-CM | POA: Insufficient documentation

## 2015-11-09 DIAGNOSIS — E785 Hyperlipidemia, unspecified: Secondary | ICD-10-CM | POA: Insufficient documentation

## 2015-11-09 DIAGNOSIS — E1140 Type 2 diabetes mellitus with diabetic neuropathy, unspecified: Secondary | ICD-10-CM

## 2015-11-09 DIAGNOSIS — N179 Acute kidney failure, unspecified: Secondary | ICD-10-CM | POA: Insufficient documentation

## 2015-11-09 DIAGNOSIS — F419 Anxiety disorder, unspecified: Secondary | ICD-10-CM | POA: Diagnosis not present

## 2015-11-09 DIAGNOSIS — G4730 Sleep apnea, unspecified: Secondary | ICD-10-CM | POA: Diagnosis not present

## 2015-11-09 DIAGNOSIS — I5032 Chronic diastolic (congestive) heart failure: Secondary | ICD-10-CM | POA: Diagnosis not present

## 2015-11-09 DIAGNOSIS — E1122 Type 2 diabetes mellitus with diabetic chronic kidney disease: Secondary | ICD-10-CM | POA: Diagnosis not present

## 2015-11-09 DIAGNOSIS — I5033 Acute on chronic diastolic (congestive) heart failure: Secondary | ICD-10-CM | POA: Insufficient documentation

## 2015-11-09 DIAGNOSIS — Z885 Allergy status to narcotic agent status: Secondary | ICD-10-CM | POA: Insufficient documentation

## 2015-11-09 DIAGNOSIS — Z7982 Long term (current) use of aspirin: Secondary | ICD-10-CM | POA: Diagnosis not present

## 2015-11-09 DIAGNOSIS — N183 Chronic kidney disease, stage 3 unspecified: Secondary | ICD-10-CM

## 2015-11-09 DIAGNOSIS — J9610 Chronic respiratory failure, unspecified whether with hypoxia or hypercapnia: Secondary | ICD-10-CM

## 2015-11-09 DIAGNOSIS — I130 Hypertensive heart and chronic kidney disease with heart failure and stage 1 through stage 4 chronic kidney disease, or unspecified chronic kidney disease: Secondary | ICD-10-CM | POA: Insufficient documentation

## 2015-11-09 DIAGNOSIS — J449 Chronic obstructive pulmonary disease, unspecified: Secondary | ICD-10-CM | POA: Diagnosis not present

## 2015-11-09 DIAGNOSIS — G20 Parkinson's disease: Secondary | ICD-10-CM | POA: Insufficient documentation

## 2015-11-09 DIAGNOSIS — Z9181 History of falling: Secondary | ICD-10-CM | POA: Diagnosis not present

## 2015-11-09 DIAGNOSIS — I480 Paroxysmal atrial fibrillation: Secondary | ICD-10-CM | POA: Diagnosis not present

## 2015-11-09 DIAGNOSIS — E1129 Type 2 diabetes mellitus with other diabetic kidney complication: Secondary | ICD-10-CM | POA: Insufficient documentation

## 2015-11-09 DIAGNOSIS — N184 Chronic kidney disease, stage 4 (severe): Secondary | ICD-10-CM | POA: Insufficient documentation

## 2015-11-09 LAB — MAGNESIUM: Magnesium: 2.3 mg/dL (ref 1.7–2.4)

## 2015-11-09 LAB — BASIC METABOLIC PANEL
Anion gap: 10 (ref 5–15)
BUN: 59 mg/dL — ABNORMAL HIGH (ref 6–20)
CALCIUM: 9.4 mg/dL (ref 8.9–10.3)
CO2: 26 mmol/L (ref 22–32)
CREATININE: 2.67 mg/dL — AB (ref 0.44–1.00)
Chloride: 98 mmol/L — ABNORMAL LOW (ref 101–111)
GFR, EST AFRICAN AMERICAN: 20 mL/min — AB (ref >60)
GFR, EST NON AFRICAN AMERICAN: 17 mL/min — AB (ref >60)
Glucose, Bld: 109 mg/dL — ABNORMAL HIGH (ref 65–99)
Potassium: 4.8 mmol/L (ref 3.5–5.1)
SODIUM: 134 mmol/L — AB (ref 135–145)

## 2015-11-09 MED ORDER — TORSEMIDE 20 MG PO TABS
40.0000 mg | ORAL_TABLET | Freq: Two times a day (BID) | ORAL | 3 refills | Status: DC
Start: 2015-11-09 — End: 2015-11-09

## 2015-11-09 NOTE — Progress Notes (Signed)
Advanced Heart Failure Clinic Note   Referring Physician: Dr. Ree Kida Primary Physician: Dr. Salvadore Oxford Primary Cardiologist:  Dr. Clayburn Pert HF: Dr. Haroldine Laws   HPI:  Melissa Powers is a 70 y.o.  female with PMH of HFpEF, Afib on anticoagulation, CKD Stage 3, T2DM, HTN, HLD, and Parkinson's disease.  She had previously followed with Cardiology in [place] and was a part of a ReDS Vest Program with Dr. Haroldine Laws providing direction as well.  Admitted 8/1 -10/22/15 with worsening DOE and generalized weakness despite adjustment of outpatient diuretics.  CXR with CHF with mild interstitial edema. She was treated with IV diuretics with resolution of symptoms. Discharge weight 182 lbs. VEST reading on d/c 43% suggesting residual volume overload.    She presents today for post hospital follow up. She is back at home now. Did rehab at [facility].  Weight 182 on discharge and now up to 188.6 by home scale.  Still SOB with minimal exertion including bathing and getting dressed.  Taking torsemide 40mg /20mg  as directed on discharge summary, but by cardiology recommendations was supposed to be on 40 mg torsemide BID at the very least.  She is also drinking close to 128 oz of fluid daily. Denies overt chest pain. Feels mildly heavy in her chest with SOB. Occasional palpitations. Did have episode of afib in rehab. Fell last night in the bathroom. Bruised her bottom. No other injury, denies LOC.   Echo 10/20/15 with LVEF 55-60%, mild/mod MR, Mod LAE, mild RVH, mild RAE, PA peak pressure 69 mm Hg  Past Medical History:  Diagnosis Date  . Anxiety   . Atrial fibrillation ([place])   . CHF (congestive heart failure) ([place])   . Chronic kidney disease (CKD), stage III (moderate)   . COPD (chronic obstructive pulmonary disease) ([place])   . History of home oxygen therapy    "2.5L; 24/7" (10/19/2015)  . Hyperlipidemia   . Hypertension   . Pneumonia 08/2015  . Sleep apnea    "tried mask; can't wear it"  (10/19/2015)  . Tremor   . Type II diabetes mellitus ([place])     Current Outpatient Prescriptions  Medication Sig Dispense Refill  . amLODipine (NORVASC) 10 MG tablet Take 5 mg by mouth daily.    Marland Kitchen aspirin EC 81 MG EC tablet Take 1 tablet (81 mg total) by mouth daily.    Marland Kitchen atorvastatin (LIPITOR) 20 MG tablet Take 1 tablet (20 mg total) by mouth daily at 6 PM. 30 tablet 0  . canagliflozin (INVOKANA) 100 MG TABS tablet Take 100 mg by mouth daily.    . carbidopa-levodopa (SINEMET IR) 25-100 MG per tablet Take 1 tablet by mouth 3 (three) times daily.    . citalopram (CELEXA) 20 MG tablet Take 20 mg by mouth daily.    . fenofibrate 160 MG tablet Take 160 mg by mouth daily.    Marland Kitchen gabapentin (NEURONTIN) 100 MG capsule Take 100 mg by mouth 3 (three) times daily.    . Rivaroxaban (XARELTO) 15 MG TABS tablet Take 15 mg by mouth daily with supper.    Marland Kitchen rOPINIRole (REQUIP) 0.25 MG tablet Take 1 tablet (0.25 mg total) by mouth 3 (three) times daily. 30 tablet 0  . sitaGLIPtin (JANUVIA) 100 MG tablet Take 100 mg by mouth daily.    Marland Kitchen torsemide (DEMADEX) 20 MG tablet Take 20-40 mg by mouth 2 (two) times daily. Take 40 mg q am and 20 mg q pm.    . valsartan (DIOVAN) 320 MG tablet  Take 320 mg by mouth daily.     No current facility-administered medications for this encounter.     Allergies  Allergen Reactions  . Morphine And Related Other (See Comments)    Pt reports intolerance due to sedation from morphine      Social History   Social History  . Marital status: Married    Spouse name: N/A  . Number of children: N/A  . Years of education: N/A   Occupational History  . Not on file.   Social History Main Topics  . Smoking status: Never Smoker  . Smokeless tobacco: Never Used  . Alcohol use No  . Drug use: No  . Sexual activity: Not Currently   Other Topics Concern  . Not on file   Social History Narrative  . No narrative on file     No family history on file.  Vitals:   11/09/15  1150  BP: (!) 110/50  Pulse: (!) 55  SpO2: 91%  Weight: 188 lb 9.6 oz (85.5 kg)   Wt Readings from Last 3 Encounters:  11/09/15 188 lb 9.6 oz (85.5 kg)  10/22/15 182 lb 4.8 oz (82.7 kg)     PHYSICAL EXAM: General:  Elderly appearing, resting tremor HEENT: normal Neck: supple. JVD 9-10. Carotids 2+ bilat; no bruits. No thyromegaly or nodule noted.  Cor: PMI nondisplaced. RRR, slightly brady. No rubs, gallops or murmurs appreciated Lungs: Mildly diminished basilar sounds.  Abdomen: soft, NT, ND, no HSM. No bruits or masses. +BS  Extremities: no cyanosis, clubbing, rash. Trace ankle edema.  Neuro: alert & oriented x 3, cranial nerves grossly intact. moves all 4 extremities w/o difficulty. Affect pleasant.  ECG: 54 bpm with marked interference from Parkinsons brain stimulator   Assessment   1. Chronic diastolic CHF - Echo 123456 LVEF 55-60% 2. Chronic respiratory failure 3. Paroxysmal Afib 4. AKI on CKD stage III 5. T2DM with neuropathy 6. HTN 7. HLD 8. Anxiety 9. Parkinsons with falls and weakness.  Plan    She is mildly volume overloaded in the setting of failure to restrict fluid and insufficient diuretics.   Increase torsemide to 40 mg BID.  BMET today.  Discussed importance of limiting fluid to < 2000 mL (64 oz) daily, daily weights, and salt restriction.   Will follow up in 2 weeks to keep close tabs.  Check ReDS vest reading at next visit.  Pt knows to call the clinic with worsening symptoms or weight gain of 3 lbs overnight or 5 lbs within one week.   Recommended she use cane at home in bathroom as she is unable to fit walker in the door easily.  Shirley Friar, PA-C 11/09/15   Total time spent > 25 minutes. Over half that spent discussing the above.

## 2015-11-09 NOTE — Patient Instructions (Signed)
Routine lab work today. Will notify you of abnormal results, otherwise no news is good news!  INCREASE Torsemide to 40 mg (2 tabs) twice daily.  Follow up 2 weeks with Oda Kilts PA-C.  Do the following things EVERYDAY: 1) Weigh yourself in the morning before breakfast. Write it down and keep it in a log. 2) Take your medicines as prescribed 3) Eat low salt foods-Limit salt (sodium) to 2000 mg per day.  4) Stay as active as you can everyday 5) Limit all fluids for the day to less than 2 liters

## 2015-11-10 ENCOUNTER — Other Ambulatory Visit (HOSPITAL_COMMUNITY): Payer: Self-pay | Admitting: Cardiology

## 2015-11-10 DIAGNOSIS — I509 Heart failure, unspecified: Secondary | ICD-10-CM

## 2015-11-15 ENCOUNTER — Ambulatory Visit (HOSPITAL_COMMUNITY)
Admission: RE | Admit: 2015-11-15 | Discharge: 2015-11-15 | Disposition: A | Discharge status: Home / Self Care | Payer: Medicare Other | Source: Ambulatory Visit | Attending: Cardiology | Admitting: Cardiology

## 2015-11-15 DIAGNOSIS — I509 Heart failure, unspecified: Secondary | ICD-10-CM | POA: Diagnosis not present

## 2015-11-15 LAB — BASIC METABOLIC PANEL
ANION GAP: 9 (ref 5–15)
BUN: 55 mg/dL — AB (ref 6–20)
CHLORIDE: 99 mmol/L — AB (ref 101–111)
CO2: 31 mmol/L (ref 22–32)
Calcium: 9.7 mg/dL (ref 8.9–10.3)
Creatinine, Ser: 2.45 mg/dL — ABNORMAL HIGH (ref 0.44–1.00)
GFR calc Af Amer: 22 mL/min — ABNORMAL LOW (ref >60)
GFR, EST NON AFRICAN AMERICAN: 19 mL/min — AB (ref >60)
Glucose, Bld: 116 mg/dL — ABNORMAL HIGH (ref 65–99)
POTASSIUM: 5 mmol/L (ref 3.5–5.1)
SODIUM: 139 mmol/L (ref 135–145)

## 2015-11-24 ENCOUNTER — Ambulatory Visit (HOSPITAL_COMMUNITY)
Admission: RE | Admit: 2015-11-24 | Discharge: 2015-11-24 | Disposition: A | Discharge status: Home / Self Care | Payer: Medicare (Managed Care) | Source: Ambulatory Visit | Attending: Cardiology | Admitting: Cardiology

## 2015-11-24 VITALS — BP 142/58 | HR 57 | Wt 188.6 lb

## 2015-11-24 DIAGNOSIS — G4730 Sleep apnea, unspecified: Secondary | ICD-10-CM | POA: Diagnosis not present

## 2015-11-24 DIAGNOSIS — G20 Parkinson's disease: Secondary | ICD-10-CM | POA: Insufficient documentation

## 2015-11-24 DIAGNOSIS — I130 Hypertensive heart and chronic kidney disease with heart failure and stage 1 through stage 4 chronic kidney disease, or unspecified chronic kidney disease: Secondary | ICD-10-CM | POA: Diagnosis not present

## 2015-11-24 DIAGNOSIS — E1122 Type 2 diabetes mellitus with diabetic chronic kidney disease: Secondary | ICD-10-CM | POA: Insufficient documentation

## 2015-11-24 DIAGNOSIS — Z8701 Personal history of pneumonia (recurrent): Secondary | ICD-10-CM | POA: Insufficient documentation

## 2015-11-24 DIAGNOSIS — R42 Dizziness and giddiness: Secondary | ICD-10-CM | POA: Diagnosis not present

## 2015-11-24 DIAGNOSIS — J9610 Chronic respiratory failure, unspecified whether with hypoxia or hypercapnia: Secondary | ICD-10-CM

## 2015-11-24 DIAGNOSIS — Z7982 Long term (current) use of aspirin: Secondary | ICD-10-CM | POA: Insufficient documentation

## 2015-11-24 DIAGNOSIS — R002 Palpitations: Secondary | ICD-10-CM | POA: Diagnosis not present

## 2015-11-24 DIAGNOSIS — N183 Chronic kidney disease, stage 3 unspecified: Secondary | ICD-10-CM

## 2015-11-24 DIAGNOSIS — F419 Anxiety disorder, unspecified: Secondary | ICD-10-CM | POA: Insufficient documentation

## 2015-11-24 DIAGNOSIS — Z7901 Long term (current) use of anticoagulants: Secondary | ICD-10-CM | POA: Insufficient documentation

## 2015-11-24 DIAGNOSIS — E785 Hyperlipidemia, unspecified: Secondary | ICD-10-CM | POA: Insufficient documentation

## 2015-11-24 DIAGNOSIS — H8110 Benign paroxysmal vertigo, unspecified ear: Secondary | ICD-10-CM

## 2015-11-24 DIAGNOSIS — I480 Paroxysmal atrial fibrillation: Secondary | ICD-10-CM | POA: Diagnosis not present

## 2015-11-24 DIAGNOSIS — Z885 Allergy status to narcotic agent status: Secondary | ICD-10-CM | POA: Insufficient documentation

## 2015-11-24 DIAGNOSIS — I5032 Chronic diastolic (congestive) heart failure: Secondary | ICD-10-CM | POA: Diagnosis not present

## 2015-11-24 DIAGNOSIS — IMO0002 Reserved for concepts with insufficient information to code with codable children: Secondary | ICD-10-CM

## 2015-11-24 DIAGNOSIS — J449 Chronic obstructive pulmonary disease, unspecified: Secondary | ICD-10-CM | POA: Insufficient documentation

## 2015-11-24 DIAGNOSIS — I10 Essential (primary) hypertension: Secondary | ICD-10-CM

## 2015-11-24 DIAGNOSIS — N179 Acute kidney failure, unspecified: Secondary | ICD-10-CM | POA: Diagnosis not present

## 2015-11-24 DIAGNOSIS — E1140 Type 2 diabetes mellitus with diabetic neuropathy, unspecified: Secondary | ICD-10-CM

## 2015-11-24 DIAGNOSIS — G20A1 Parkinson's disease without dyskinesia, without mention of fluctuations: Secondary | ICD-10-CM

## 2015-11-24 LAB — BASIC METABOLIC PANEL
Anion gap: 10 (ref 5–15)
BUN: 80 mg/dL — AB (ref 6–20)
CALCIUM: 9.8 mg/dL (ref 8.9–10.3)
CO2: 30 mmol/L (ref 22–32)
Chloride: 98 mmol/L — ABNORMAL LOW (ref 101–111)
Creatinine, Ser: 2.57 mg/dL — ABNORMAL HIGH (ref 0.44–1.00)
GFR calc Af Amer: 21 mL/min — ABNORMAL LOW (ref >60)
GFR, EST NON AFRICAN AMERICAN: 18 mL/min — AB (ref >60)
GLUCOSE: 129 mg/dL — AB (ref 65–99)
Potassium: 4.1 mmol/L (ref 3.5–5.1)
Sodium: 138 mmol/L (ref 135–145)

## 2015-11-24 LAB — CBC
HCT: 34.8 % — ABNORMAL LOW (ref 36.0–46.0)
Hemoglobin: 10.6 g/dL — ABNORMAL LOW (ref 12.0–15.0)
MCH: 27.3 pg (ref 26.0–34.0)
MCHC: 30.5 g/dL (ref 30.0–36.0)
MCV: 89.7 fL (ref 78.0–100.0)
Platelets: 264 10*3/uL (ref 150–400)
RBC: 3.88 MIL/uL (ref 3.87–5.11)
RDW: 13.9 % (ref 11.5–15.5)
WBC: 6.3 10*3/uL (ref 4.0–10.5)

## 2015-11-24 LAB — BRAIN NATRIURETIC PEPTIDE: B Natriuretic Peptide: 247.2 pg/mL — ABNORMAL HIGH (ref 0.0–100.0)

## 2015-11-24 MED ORDER — TORSEMIDE 20 MG PO TABS: 40.0000 mg | ORAL_TABLET | ORAL | 3 refills | Status: DC

## 2015-11-24 NOTE — Progress Notes (Signed)
Advanced Heart Failure Clinic Note   Referring Physician: Dr. Ree Kida Primary Physician: Dr. Salvadore Oxford Primary Cardiologist:  Dr. Clayburn Pert HF: Dr. Haroldine Laws   HPI:  Melissa Powers is a 70 y.o.  female with PMH of HFpEF, Afib on anticoagulation, CKD Stage 3, T2DM, HTN, HLD, and Parkinson's disease.  She had previously followed with Cardiology in [place] and was a part of a ReDS Vest Program with Dr. Haroldine Laws providing direction as well.  Admitted 8/1 -10/22/15 with worsening DOE and generalized weakness despite adjustment of outpatient diuretics.  CXR with CHF with mild interstitial edema. She was treated with IV diuretics with resolution of symptoms. Discharge weight 182 lbs. VEST reading on d/c 43% suggesting residual volume overload.   She presents today for regular follow up.  Weight at home stable at 188 lbs. Feeling more dyspneic, Class III B symptoms (ADLS, getting dressed, bathing).  Has cut back fluid to under 64 oz ( down from > 128 oz). No CP. Does have some heaviness with SOB.  Occasional palpitations. Doesn't feel like she's had any more afib. No further falls. Urine slightly darker and she thinks she has less urine output. Having Vertigo. No bleeding on Xarelto.   Echo 10/20/15 with LVEF 55-60%, mild/mod MR, Mod LAE, mild RVH, mild RAE, PA peak pressure 69 mm Hg  Past Medical History:  Diagnosis Date  . Anxiety   . Atrial fibrillation ([place])   . CHF (congestive heart failure) ([place])   . Chronic kidney disease (CKD), stage III (moderate)   . COPD (chronic obstructive pulmonary disease) ([place])   . History of home oxygen therapy    "2.5L; 24/7" (10/19/2015)  . Hyperlipidemia   . Hypertension   . Pneumonia 08/2015  . Sleep apnea    "tried mask; can't wear it" (10/19/2015)  . Tremor   . Type II diabetes mellitus ([place])     Current Outpatient Prescriptions  Medication Sig Dispense Refill  . amLODipine (NORVASC) 10 MG tablet Take 5 mg by mouth daily.    Marland Kitchen aspirin EC 81 MG  EC tablet Take 1 tablet (81 mg total) by mouth daily.    Marland Kitchen atorvastatin (LIPITOR) 20 MG tablet Take 1 tablet (20 mg total) by mouth daily at 6 PM. 30 tablet 0  . canagliflozin (INVOKANA) 100 MG TABS tablet Take 100 mg by mouth daily.    . carbidopa-levodopa (SINEMET IR) 25-100 MG per tablet Take 1 tablet by mouth 3 (three) times daily.    . citalopram (CELEXA) 20 MG tablet Take 20 mg by mouth daily.    . fenofibrate 160 MG tablet Take 160 mg by mouth daily.    Marland Kitchen gabapentin (NEURONTIN) 100 MG capsule Take 100 mg by mouth 3 (three) times daily.    . Rivaroxaban (XARELTO) 15 MG TABS tablet Take 15 mg by mouth daily with supper.    Marland Kitchen rOPINIRole (REQUIP) 0.25 MG tablet Take 1 tablet (0.25 mg total) by mouth 3 (three) times daily. 30 tablet 0  . sitaGLIPtin (JANUVIA) 100 MG tablet Take 100 mg by mouth daily.    Marland Kitchen torsemide (DEMADEX) 20 MG tablet Take 2 tablets (40 mg total) by mouth 2 (two) times daily. 360 tablet 3  . valsartan (DIOVAN) 320 MG tablet Take 320 mg by mouth daily.     No current facility-administered medications for this encounter.     Allergies  Allergen Reactions  . Morphine And Related Other (See Comments)    Pt reports intolerance due to sedation from  morphine      Social History   Social History  . Marital status: Married    Spouse name: N/A  . Number of children: N/A  . Years of education: N/A   Occupational History  . Not on file.   Social History Main Topics  . Smoking status: Never Smoker  . Smokeless tobacco: Never Used  . Alcohol use No  . Drug use: No  . Sexual activity: Not Currently   Other Topics Concern  . Not on file   Social History Narrative  . No narrative on file    Vitals:   11/24/15 1132  BP: (!) 142/58  Pulse: (!) 57  SpO2: 91%  Weight: 188 lb 9.6 oz (85.5 kg)   Wt Readings from Last 3 Encounters:  11/24/15 188 lb 9.6 oz (85.5 kg)  11/09/15 188 lb 9.6 oz (85.5 kg)  10/22/15 182 lb 4.8 oz (82.7 kg)     PHYSICAL  EXAM: General:  Elderly appearing, resting tremor HEENT: normal Neck: supple. JVD 8-9. Carotids 2+ bilat; no bruits. No thyromegaly or nodule noted.  Cor: PMI nondisplaced. RRR, slightly brady. No M/G/R noted.  Lungs: Clear, normal effort Abdomen: soft, NT, ND, no HSM. No bruits or masses. +BS  Extremities: no cyanosis, clubbing, rash. Trace ankle edema at most.  Neuro: alert & oriented x 3, cranial nerves grossly intact. moves all 4 extremities w/o difficulty. Affect pleasant.   + Dizziness with head turning.    Assessment   1. Chronic diastolic CHF - Echo 123456 LVEF 55-60% 2. Chronic respiratory failure 3. Paroxysmal Afib 4. AKI on CKD stage III 5. T2DM with neuropathy 6. HTN 7. HLD 8. Anxiety 9. Parkinsons with falls and weakness. 10. Vertigo  Plan    Overall doing fairly well. She may be mildly volume overloaded in the setting of failure to restrict fluid and insufficient diuretics.   ReDS vest reading 42%. Remains mildly volume overloaded. Weight stable from last visit.   Cotninue torsemide 40/40 mg BID.  Recheck BMET, BNP, and CBC today.   Congratulated of limiting fluid.  Continue daily weights and salt restriction.    Recommended OTC Bonine for Vertigo. If continues recommended she contact PCP for recommendation of Vestibular Retraining in [place].   Pt knows to call the clinic with worsening symptoms or weight gain of 3 lbs overnight or 5 lbs within one week.   Shirley Friar, PA-C 11/24/15   Patient seen and examined with Oda Kilts, PA-C. We discussed all aspects of the encounter. I agree with the assessment and plan as stated above.   Overall looks very good. Edema is the best I have seen it. However ReDS is 42%. Suspect falsely elevated.  Will continue current diuretic regimen for now. If creatinine up can cut back a bit.   Alyssamarie Mounsey,MD 12:54 AM

## 2015-11-24 NOTE — Progress Notes (Signed)
REDS VEST READING= 42 CHEST RULER=18  VEST FITTING TASKS: POSTURE=stand HEIGHT MARKER=short CENTER STRIP=align  COMMENTS:

## 2015-11-24 NOTE — Patient Instructions (Signed)
INCREASE Torsemide to 60 mg In the AM (3 tabs) and 40 mg  In the PM (2tabs) You may try Bonine OTC, use as directed for vertigo If this does not resolve vertigo symptoms be sure to follow up with your PCP, you may require a prescription for Meclizine    Labs today and again in 7-10 days  Your physician recommends that you schedule a follow-up appointment in: 4 weeks with Oda Kilts, PA   Do the following things EVERYDAY: 1) Weigh yourself in the morning before breakfast. Write it down and keep it in a log. 2) Take your medicines as prescribed 3) Eat low salt foods-Limit salt (sodium) to 2000 mg per day.  4) Stay as active as you can everyday 5) Limit all fluids for the day to less than 2 liters .

## 2015-11-25 ENCOUNTER — Telehealth (HOSPITAL_COMMUNITY): Payer: Self-pay | Admitting: Cardiology

## 2015-11-25 MED ORDER — TORSEMIDE 20 MG PO TABS: 40.0000 mg | ORAL_TABLET | Freq: Two times a day (BID) | ORAL | 3 refills | Status: DC

## 2015-11-25 NOTE — Telephone Encounter (Signed)
Patient aware of lab results and medication change

## 2015-12-01 ENCOUNTER — Telehealth (HOSPITAL_COMMUNITY): Payer: Self-pay

## 2015-12-01 NOTE — Telephone Encounter (Signed)
Dr. Nyra Capes called CHF clinic triage line with patient in irregular afib (90-140's) per EKG in her clinic with generalized fatigue. Patient endorses taking a "prn diltiazem" 2 hrs prior at home. Advised to have patient come to ED for eval and treatment, will forward to Dr. Haroldine Laws for further recommendations (EP/afib clinic referral?).  Renee Pain, RN

## 2015-12-02 ENCOUNTER — Telehealth (HOSPITAL_COMMUNITY): Payer: Self-pay | Admitting: *Deleted

## 2015-12-02 ENCOUNTER — Other Ambulatory Visit (HOSPITAL_COMMUNITY): Payer: Self-pay | Admitting: *Deleted

## 2015-12-02 NOTE — Telephone Encounter (Signed)
Melissa Cornelia, NP called concerned about pt, she states her wt is up 8 lb from last week, she is at 194.1 lb today.  She states pt has been on Tor 40 mg BID and she told her 3 days ago to start taking an extra dose but pt misunderstood and has been taking an extra 60 mg (3 tabs) for past 3 days.  She reports labs were done today, CR is up to 2.7 and pro bnp is 5400.  She states pt was seen in [place] ER yesterday and Diltiazem was increased to 240 mg daily.  PT's HR is 106, BP 131/68.  She reports pt is more SOB, lungs are CTA, no edema.  Discussed w/Dr Bensimhon, he would like pt to have DCCV asap.  Arranged for tomorrow, pt to arrive at 10:30, he will eval pt and give IV lasix if needed.  Pt and husband both aware of DCCV date/time/instructions.  She states she has been taking her Xarelto and has not missed any doses.  Left message for Melissa Powers this was scheduled.

## 2015-12-03 ENCOUNTER — Ambulatory Visit (HOSPITAL_COMMUNITY)
Admission: RE | Admit: 2015-12-03 | Discharge: 2015-12-03 | Disposition: A | Discharge status: Home / Self Care | Payer: Medicare (Managed Care) | Source: Ambulatory Visit | Attending: Internal Medicine | Admitting: Internal Medicine

## 2015-12-03 ENCOUNTER — Ambulatory Visit (HOSPITAL_COMMUNITY): Payer: Medicare (Managed Care) | Admitting: Anesthesiology

## 2015-12-03 ENCOUNTER — Encounter (HOSPITAL_COMMUNITY): Payer: Self-pay

## 2015-12-03 ENCOUNTER — Encounter (HOSPITAL_COMMUNITY)
Admission: RE | Disposition: A | Discharge status: Home / Self Care | Payer: Self-pay | Source: Ambulatory Visit | Attending: Internal Medicine

## 2015-12-03 ENCOUNTER — Other Ambulatory Visit (HOSPITAL_COMMUNITY): Payer: No Typology Code available for payment source

## 2015-12-03 DIAGNOSIS — J449 Chronic obstructive pulmonary disease, unspecified: Secondary | ICD-10-CM | POA: Insufficient documentation

## 2015-12-03 DIAGNOSIS — I4891 Unspecified atrial fibrillation: Secondary | ICD-10-CM | POA: Insufficient documentation

## 2015-12-03 DIAGNOSIS — Z9981 Dependence on supplemental oxygen: Secondary | ICD-10-CM | POA: Diagnosis not present

## 2015-12-03 DIAGNOSIS — Z79899 Other long term (current) drug therapy: Secondary | ICD-10-CM | POA: Diagnosis not present

## 2015-12-03 DIAGNOSIS — G4730 Sleep apnea, unspecified: Secondary | ICD-10-CM | POA: Diagnosis not present

## 2015-12-03 DIAGNOSIS — Z7901 Long term (current) use of anticoagulants: Secondary | ICD-10-CM | POA: Insufficient documentation

## 2015-12-03 DIAGNOSIS — E785 Hyperlipidemia, unspecified: Secondary | ICD-10-CM | POA: Insufficient documentation

## 2015-12-03 DIAGNOSIS — I5032 Chronic diastolic (congestive) heart failure: Secondary | ICD-10-CM | POA: Insufficient documentation

## 2015-12-03 DIAGNOSIS — Z5309 Procedure and treatment not carried out because of other contraindication: Secondary | ICD-10-CM | POA: Diagnosis not present

## 2015-12-03 DIAGNOSIS — N183 Chronic kidney disease, stage 3 (moderate): Secondary | ICD-10-CM | POA: Insufficient documentation

## 2015-12-03 DIAGNOSIS — E1122 Type 2 diabetes mellitus with diabetic chronic kidney disease: Secondary | ICD-10-CM | POA: Diagnosis not present

## 2015-12-03 DIAGNOSIS — G20 Parkinson's disease: Secondary | ICD-10-CM | POA: Insufficient documentation

## 2015-12-03 DIAGNOSIS — Z794 Long term (current) use of insulin: Secondary | ICD-10-CM | POA: Insufficient documentation

## 2015-12-03 DIAGNOSIS — I130 Hypertensive heart and chronic kidney disease with heart failure and stage 1 through stage 4 chronic kidney disease, or unspecified chronic kidney disease: Secondary | ICD-10-CM | POA: Diagnosis not present

## 2015-12-03 DIAGNOSIS — Z6833 Body mass index (BMI) 33.0-33.9, adult: Secondary | ICD-10-CM | POA: Insufficient documentation

## 2015-12-03 DIAGNOSIS — Z7982 Long term (current) use of aspirin: Secondary | ICD-10-CM | POA: Insufficient documentation

## 2015-12-03 DIAGNOSIS — E1140 Type 2 diabetes mellitus with diabetic neuropathy, unspecified: Secondary | ICD-10-CM | POA: Insufficient documentation

## 2015-12-03 LAB — BASIC METABOLIC PANEL
ANION GAP: 11 (ref 5–15)
BUN: 94 mg/dL — AB (ref 6–20)
CALCIUM: 9.5 mg/dL (ref 8.9–10.3)
CO2: 26 mmol/L (ref 22–32)
Chloride: 98 mmol/L — ABNORMAL LOW (ref 101–111)
Creatinine, Ser: 2.5 mg/dL — ABNORMAL HIGH (ref 0.44–1.00)
GFR calc Af Amer: 21 mL/min — ABNORMAL LOW (ref >60)
GFR, EST NON AFRICAN AMERICAN: 18 mL/min — AB (ref >60)
Glucose, Bld: 178 mg/dL — ABNORMAL HIGH (ref 65–99)
POTASSIUM: 4.5 mmol/L (ref 3.5–5.1)
SODIUM: 135 mmol/L (ref 135–145)

## 2015-12-03 SURGERY — CANCELLED PROCEDURE

## 2015-12-03 MED ORDER — SODIUM CHLORIDE 0.9 % IV SOLN
INTRAVENOUS | Status: AC | PRN
  Administered 2015-12-03 – 2016-02-03 (×4): via INTRAVENOUS

## 2015-12-03 NOTE — Anesthesia Procedure Notes (Signed)
Procedure Name: MAC Date/Time: 12/03/2015 11:50 AM Performed by: Neldon Newport Pre-anesthesia Checklist: Timeout performed, Patient being monitored, Suction available, Emergency Drugs available and Patient identified Oxygen Delivery Method: Ambu bag and Nasal cannula Preoxygenation: Pre-oxygenation with 100% oxygen Placement Confirmation: positive ETCO2

## 2015-12-03 NOTE — Anesthesia Preprocedure Evaluation (Addendum)
Anesthesia Evaluation  Patient identified by MRN, date of birth, ID band Patient awake    Reviewed: Allergy & Precautions, H&P , NPO status , Patient's Chart, lab work & pertinent test results  Airway Mallampati: II       Dental  (+) Dental Advidsory Given   Pulmonary sleep apnea , COPD,  oxygen dependent,           Cardiovascular hypertension, Pt. on medications (-) angina+CHF  + dysrhythmias Atrial Fibrillation   8/17 ECHO:  EF 55-60%, mod MR   Neuro/Psych    GI/Hepatic   Endo/Other  diabetesMorbid obesity  Renal/GU Renal InsufficiencyRenal disease (creat 2.57)     Musculoskeletal   Abdominal   Peds  Hematology   Anesthesia Other Findings Study Conclusions  - Left ventricle: The cavity size was normal. Wall thickness was   increased in a pattern of mild LVH. Systolic function was normal.   The estimated ejection fraction was in the range of 55% to 60%.   Doppler parameters are consistent with both elevated ventricular   end-diastolic filling pressure and elevated left atrial filling   pressure. - Aortic valve: Valve area (VTI): 2.02 cm^2. Valve area (Vmax):   1.79 cm^2. Valve area (Vmean): 2.02 cm^2. - Mitral valve: There was mild to moderate regurgitation. Valve   area by pressure half-time: 2.42 cm^2. Valve area by continuity   equation (using LVOT flow): 1.96 cm^2. - Left atrium: The atrium was moderately dilated. - Right ventricle: The cavity size was mildly dilated. - Right atrium: The atrium was mildly dilated. - Atrial septum: No defect or patent foramen ovale was identified. - Pulmonary arteries: PA peak pressure: 69   Reproductive/Obstetrics                            Anesthesia Physical Anesthesia Plan Anesthesia Quick Evaluation

## 2015-12-06 ENCOUNTER — Encounter (HOSPITAL_COMMUNITY): Payer: Self-pay | Admitting: Internal Medicine

## 2015-12-08 NOTE — Progress Notes (Signed)
Patient procedure cancelled due to converting to NSR once Dr Haroldine Laws looked at monitor.

## 2015-12-14 ENCOUNTER — Telehealth (HOSPITAL_COMMUNITY): Payer: Self-pay | Admitting: *Deleted

## 2015-12-14 DIAGNOSIS — I4891 Unspecified atrial fibrillation: Secondary | ICD-10-CM

## 2015-12-14 NOTE — Telephone Encounter (Signed)
Melissa Cornelia, NP called concerned about pt's HR, she states it has been running 100-110 area but last night pt c/o feeling it race and it was up to 140, she gave pt Diltiazem 30 mg x 2 doses and this AM her HR is down to 111.  She reports pt is not SOB and has no edema, pt is on Amio 200 mg BID, pt does c/o feeling more fatigue.  Discussed all above w/Dr Bensimhon, he recommends pt wear a monitor to see how much a-fib she is having.  Melissa Powers is aware and agreeable, will place order and get arranged

## 2015-12-15 ENCOUNTER — Telehealth (HOSPITAL_COMMUNITY): Payer: Self-pay | Admitting: Vascular Surgery

## 2015-12-15 NOTE — Telephone Encounter (Signed)
Per Dr Haroldine Laws order for 30 day event monitor faxed

## 2015-12-15 NOTE — Telephone Encounter (Signed)
Sent message to Melissa Powers to schedule holter monitor

## 2015-12-19 ENCOUNTER — Encounter (HOSPITAL_COMMUNITY): Payer: Self-pay

## 2015-12-19 ENCOUNTER — Emergency Department (HOSPITAL_COMMUNITY): Payer: Medicare (Managed Care)

## 2015-12-19 ENCOUNTER — Inpatient Hospital Stay (HOSPITAL_COMMUNITY)
Admission: EM | Admit: 2015-12-19 | Discharge: 2015-12-23 | DRG: 871 | Disposition: A | Discharge status: Home / Self Care | Payer: Medicare (Managed Care) | Attending: Internal Medicine | Admitting: Internal Medicine

## 2015-12-19 DIAGNOSIS — Z823 Family history of stroke: Secondary | ICD-10-CM

## 2015-12-19 DIAGNOSIS — I480 Paroxysmal atrial fibrillation: Secondary | ICD-10-CM | POA: Diagnosis present

## 2015-12-19 DIAGNOSIS — Z7901 Long term (current) use of anticoagulants: Secondary | ICD-10-CM

## 2015-12-19 DIAGNOSIS — E785 Hyperlipidemia, unspecified: Secondary | ICD-10-CM | POA: Diagnosis present

## 2015-12-19 DIAGNOSIS — I130 Hypertensive heart and chronic kidney disease with heart failure and stage 1 through stage 4 chronic kidney disease, or unspecified chronic kidney disease: Secondary | ICD-10-CM | POA: Diagnosis present

## 2015-12-19 DIAGNOSIS — F419 Anxiety disorder, unspecified: Secondary | ICD-10-CM | POA: Diagnosis present

## 2015-12-19 DIAGNOSIS — G20 Parkinson's disease: Secondary | ICD-10-CM | POA: Diagnosis present

## 2015-12-19 DIAGNOSIS — A419 Sepsis, unspecified organism: Secondary | ICD-10-CM | POA: Diagnosis not present

## 2015-12-19 DIAGNOSIS — Z7982 Long term (current) use of aspirin: Secondary | ICD-10-CM

## 2015-12-19 DIAGNOSIS — I4891 Unspecified atrial fibrillation: Secondary | ICD-10-CM

## 2015-12-19 DIAGNOSIS — N390 Urinary tract infection, site not specified: Secondary | ICD-10-CM | POA: Diagnosis present

## 2015-12-19 DIAGNOSIS — I481 Persistent atrial fibrillation: Secondary | ICD-10-CM | POA: Diagnosis present

## 2015-12-19 DIAGNOSIS — N184 Chronic kidney disease, stage 4 (severe): Secondary | ICD-10-CM | POA: Diagnosis present

## 2015-12-19 DIAGNOSIS — Z794 Long term (current) use of insulin: Secondary | ICD-10-CM

## 2015-12-19 DIAGNOSIS — I10 Essential (primary) hypertension: Secondary | ICD-10-CM | POA: Diagnosis present

## 2015-12-19 DIAGNOSIS — R001 Bradycardia, unspecified: Secondary | ICD-10-CM | POA: Diagnosis present

## 2015-12-19 DIAGNOSIS — I5033 Acute on chronic diastolic (congestive) heart failure: Secondary | ICD-10-CM | POA: Diagnosis present

## 2015-12-19 DIAGNOSIS — R42 Dizziness and giddiness: Secondary | ICD-10-CM

## 2015-12-19 DIAGNOSIS — F329 Major depressive disorder, single episode, unspecified: Secondary | ICD-10-CM | POA: Diagnosis present

## 2015-12-19 DIAGNOSIS — E1140 Type 2 diabetes mellitus with diabetic neuropathy, unspecified: Secondary | ICD-10-CM | POA: Diagnosis present

## 2015-12-19 DIAGNOSIS — I2510 Atherosclerotic heart disease of native coronary artery without angina pectoris: Secondary | ICD-10-CM | POA: Diagnosis present

## 2015-12-19 DIAGNOSIS — Z79899 Other long term (current) drug therapy: Secondary | ICD-10-CM

## 2015-12-19 DIAGNOSIS — E1122 Type 2 diabetes mellitus with diabetic chronic kidney disease: Secondary | ICD-10-CM | POA: Diagnosis present

## 2015-12-19 DIAGNOSIS — R6520 Severe sepsis without septic shock: Secondary | ICD-10-CM

## 2015-12-19 DIAGNOSIS — D649 Anemia, unspecified: Secondary | ICD-10-CM | POA: Diagnosis present

## 2015-12-19 DIAGNOSIS — E1129 Type 2 diabetes mellitus with other diabetic kidney complication: Secondary | ICD-10-CM

## 2015-12-19 DIAGNOSIS — IMO0002 Reserved for concepts with insufficient information to code with codable children: Secondary | ICD-10-CM | POA: Diagnosis present

## 2015-12-19 DIAGNOSIS — J449 Chronic obstructive pulmonary disease, unspecified: Secondary | ICD-10-CM | POA: Diagnosis present

## 2015-12-19 DIAGNOSIS — Z841 Family history of disorders of kidney and ureter: Secondary | ICD-10-CM

## 2015-12-19 DIAGNOSIS — J9610 Chronic respiratory failure, unspecified whether with hypoxia or hypercapnia: Secondary | ICD-10-CM | POA: Diagnosis present

## 2015-12-19 DIAGNOSIS — R079 Chest pain, unspecified: Secondary | ICD-10-CM

## 2015-12-19 LAB — DIFFERENTIAL
Basophils Absolute: 0 10*3/uL (ref 0.0–0.1)
Basophils Relative: 0 %
EOS ABS: 0.1 10*3/uL (ref 0.0–0.7)
EOS PCT: 1 %
LYMPHS ABS: 1.6 10*3/uL (ref 0.7–4.0)
LYMPHS PCT: 14 %
MONO ABS: 0.7 10*3/uL (ref 0.1–1.0)
MONOS PCT: 6 %
NEUTROS PCT: 79 %
Neutro Abs: 8.9 10*3/uL — ABNORMAL HIGH (ref 1.7–7.7)

## 2015-12-19 LAB — CBC
HEMATOCRIT: 29.5 % — AB (ref 36.0–46.0)
Hemoglobin: 9.1 g/dL — ABNORMAL LOW (ref 12.0–15.0)
MCH: 27.1 pg (ref 26.0–34.0)
MCHC: 30.8 g/dL (ref 30.0–36.0)
MCV: 87.8 fL (ref 78.0–100.0)
PLATELETS: 220 10*3/uL (ref 150–400)
RBC: 3.36 MIL/uL — ABNORMAL LOW (ref 3.87–5.11)
RDW: 14.2 % (ref 11.5–15.5)
WBC: 10.9 10*3/uL — AB (ref 4.0–10.5)

## 2015-12-19 LAB — BASIC METABOLIC PANEL
Anion gap: 11 (ref 5–15)
BUN: 95 mg/dL — AB (ref 6–20)
CHLORIDE: 94 mmol/L — AB (ref 101–111)
CO2: 28 mmol/L (ref 22–32)
CREATININE: 2.05 mg/dL — AB (ref 0.44–1.00)
Calcium: 9.5 mg/dL (ref 8.9–10.3)
GFR calc Af Amer: 27 mL/min — ABNORMAL LOW (ref >60)
GFR calc non Af Amer: 23 mL/min — ABNORMAL LOW (ref >60)
GLUCOSE: 199 mg/dL — AB (ref 65–99)
POTASSIUM: 3.7 mmol/L (ref 3.5–5.1)
SODIUM: 133 mmol/L — AB (ref 135–145)

## 2015-12-19 LAB — I-STAT TROPONIN, ED: Troponin i, poc: 0 ng/mL (ref 0.00–0.08)

## 2015-12-19 MED ORDER — METOPROLOL TARTRATE 5 MG/5ML IV SOLN
5.0000 mg | Freq: Once | INTRAVENOUS | Status: AC
  Administered 2015-12-19: 5 mg via INTRAVENOUS
  Filled 2015-12-19: qty 5

## 2015-12-19 MED ORDER — SODIUM CHLORIDE 0.9 % IV BOLUS (SEPSIS)
1000.0000 mL | Freq: Once | INTRAVENOUS | Status: AC
  Administered 2015-12-19: 1000 mL via INTRAVENOUS

## 2015-12-19 MED ORDER — SODIUM CHLORIDE 0.9 % IV BOLUS (SEPSIS)
500.0000 mL | Freq: Once | INTRAVENOUS | Status: AC
  Administered 2015-12-19: 500 mL via INTRAVENOUS

## 2015-12-19 MED ORDER — METOPROLOL TARTRATE 25 MG PO TABS
25.0000 mg | ORAL_TABLET | Freq: Once | ORAL | Status: AC
  Administered 2015-12-19: 25 mg via ORAL
  Filled 2015-12-19: qty 1

## 2015-12-19 NOTE — ED Provider Notes (Signed)
[facility] DEPT Provider Note   CSN: [id number] Arrival date & time: 12/19/15  2158  By signing my name below, I, Gwenlyn Fudge, attest that this documentation has been prepared under the direction and in the presence of Varney Biles, MD. Electronically Signed: Gwenlyn Fudge, ED Scribe. 12/19/15. 11:33 PM.  History   Chief Complaint Chief Complaint  Patient presents with  . Fall  . Fever  . Cough  . Dizziness   The history is provided by the patient. No language interpreter was used.   HPI Comments: Melissa Powers is a 70 y.o. female with PMHx of CHF, CKD, COPD, HLD, and HTN who presents to the Emergency Department complaining of gradual onset, intermittent dizziness onset 1 day PTA. She describes her dizziness as a spinning sensation when standing, sitting upright or walking. No dizziness when laying flat. Reports associated chest pressure, nausea, palpitations, decreased urination, lightheadedness, focal weakness in right arm, shortness of breath, fever (TMax 103 in ambulance). Chest tightness is localized to the central chest and is waxing and waning, lasting for a few minutes. Chest tightness exacerbated when sitting upright or ambulating. Tightness does not radiate. Pt reports severe lightheadedness when standing - including episodes of near fainting. Denies hx of stroke, MI. Pt states she has a brain stimulator for Parkinson's Disease. Denies vomiting, diarrhea, numbness, tingling, diaphoresis, dysuria.  Past Medical History:  Diagnosis Date  . Anxiety   . Atrial fibrillation ([place])   . CHF (congestive heart failure) ([place])   . Chronic kidney disease (CKD), stage III (moderate)   . COPD (chronic obstructive pulmonary disease) ([place])   . History of home oxygen therapy    "2.5L; 24/7" (10/19/2015)  . Hyperlipidemia   . Hypertension   . Pneumonia 08/2015  . Sleep apnea    "tried mask; can't wear it" (10/19/2015)  . Tremor   . Type II diabetes mellitus [facility])     Patient Active  Problem List   Diagnosis Date Noted  . Sepsis, unspecified organism ([place]) 12/20/2015  . UTI (urinary tract infection) 12/20/2015  . Paroxysmal atrial fibrillation ([place]) 12/20/2015  . Positional vertigo 11/24/2015  . Chronic respiratory failure ([place]) 11/09/2015  . Chronic diastolic CHF (congestive heart failure) ([place]) 11/09/2015  . Chronic kidney disease, stage 3 11/09/2015  . Diabetes mellitus type 2, controlled ([place]) 11/09/2015  . Fall   . Essential hypertension   . Parkinson's disease [facility])     Past Surgical History:  Procedure Laterality Date  . ABDOMINAL HYSTERECTOMY    . TUBAL LIGATION      OB History    No data available       Home Medications    Prior to Admission medications   Medication Sig Start Date End Date Taking? Authorizing Provider  amLODipine (NORVASC) 10 MG tablet Take 5 mg by mouth daily.   Yes Historical Provider, MD  aspirin EC 81 MG EC tablet Take 1 tablet (81 mg total) by mouth daily. 10/22/15  Yes Elsia Nigel Sloop, DO  atorvastatin (LIPITOR) 20 MG tablet Take 1 tablet (20 mg total) by mouth daily at 6 PM. 10/22/15  Yes Elsia Nigel Sloop, DO  carbidopa-levodopa (SINEMET IR) 25-100 MG per tablet Take 1 tablet by mouth 3 (three) times daily.   Yes Historical Provider, MD  cholecalciferol (VITAMIN D) 1000 units tablet Take 2,000 Units by mouth daily.   Yes Historical Provider, MD  citalopram (CELEXA) 20 MG tablet Take 20 mg by mouth daily.   Yes Historical Provider, MD  gabapentin (  NEURONTIN) 100 MG capsule Take 300 mg by mouth 3 (three) times daily.    Yes Historical Provider, MD  hydrALAZINE (APRESOLINE) 50 MG tablet Take 50 mg by mouth 4 (four) times daily.   Yes Historical Provider, MD  insulin aspart (NOVOLOG) 100 UNIT/ML injection Inject 10 Units into the skin at bedtime.   Yes Historical Provider, MD  metoprolol tartrate (LOPRESSOR) 25 MG tablet Take 25 mg by mouth 2 (two) times daily.   Yes Historical Provider, MD  Rivaroxaban (XARELTO) 15 MG TABS tablet Take 15  mg by mouth daily with supper.   Yes Historical Provider, MD  rOPINIRole (REQUIP) 0.25 MG tablet Take 1 tablet (0.25 mg total) by mouth 3 (three) times daily. 10/22/15  Yes Elsia Nigel Sloop, DO  torsemide (DEMADEX) 20 MG tablet Take 2 tablets (40 mg total) by mouth 2 (two) times daily. 11/25/15  Yes Shirley Friar, PA-C  traZODone (DESYREL) 50 MG tablet Take 50 mg by mouth at bedtime.    Yes Historical Provider, MD  valsartan (DIOVAN) 320 MG tablet Take 320 mg by mouth daily.   Yes Historical Provider, MD    Family History Family History  Problem Relation Age of Onset  . Dementia Mother   . Heart Problems Father   . Stroke Sister   . Kidney failure Brother     on ESRD    Social History Social History  Substance Use Topics  . Smoking status: Never Smoker  . Smokeless tobacco: Never Used  . Alcohol use No     Allergies   Morphine and related   Review of Systems Review of Systems  A complete 10 system review of systems was obtained and all systems are negative except as noted in the HPI and PMH.   Physical Exam Updated Vital Signs BP 115/58   Pulse 108   Temp 98.8 F (37.1 C) (Rectal)   Resp 18   SpO2 97%   Physical Exam  Constitutional: She is oriented to person, place, and time. She appears well-developed and well-nourished. No distress.  HENT:  Head: Normocephalic and atraumatic.  Eyes: EOM are normal.  Neck: Normal range of motion. JVD present.  Cardiovascular: Normal heart sounds.  An irregularly irregular rhythm present. Tachycardia present.   Pulmonary/Chest: Effort normal and breath sounds normal. She has no wheezes. She has no rales.  Lungs clear  Abdominal: Soft. She exhibits no distension and no mass. There is no tenderness.  Musculoskeletal: Normal range of motion.  Upper and Lower extremity strength is 4+/ 5 and equal  Neurological: She is alert and oriented to person, place, and time.  Cranial nerves 2-12 intact Bilateral upper and lower extremity  sensory exam is normal  Skin: Skin is warm and dry.  Psychiatric: She has a normal mood and affect. Judgment normal.  Nursing note and vitals reviewed.  ED Treatments / Results  DIAGNOSTIC STUDIES: Oxygen Saturation is 97% on RA, adequate by my interpretation.    COORDINATION OF CARE: 11:09 PM Discussed treatment plan with pt at bedside which includes Urine Culture and pt agreed to plan.  Labs (all labs ordered are listed, but only abnormal results are displayed) Labs Reviewed  BASIC METABOLIC PANEL - Abnormal; Notable for the following:       Result Value   Sodium 133 (*)    Chloride 94 (*)    Glucose, Bld 199 (*)    BUN 95 (*)    Creatinine, Ser 2.05 (*)    GFR calc non  Af Amer 23 (*)    GFR calc Af Amer 27 (*)    All other components within normal limits  CBC - Abnormal; Notable for the following:    WBC 10.9 (*)    RBC 3.36 (*)    Hemoglobin 9.1 (*)    HCT 29.5 (*)    All other components within normal limits  DIFFERENTIAL - Abnormal; Notable for the following:    Neutro Abs 8.9 (*)    All other components within normal limits  URINALYSIS, ROUTINE W REFLEX MICROSCOPIC (NOT AT [facility]) - Abnormal; Notable for the following:    APPearance CLOUDY (*)    Nitrite POSITIVE (*)    All other components within normal limits  BRAIN NATRIURETIC PEPTIDE - Abnormal; Notable for the following:    B Natriuretic Peptide 800.7 (*)    All other components within normal limits  URINE MICROSCOPIC-ADD ON - Abnormal; Notable for the following:    Squamous Epithelial / LPF 0-5 (*)    Bacteria, UA MANY (*)    All other components within normal limits  CULTURE, BLOOD (ROUTINE X 2)  CULTURE, BLOOD (ROUTINE X 2)  URINE CULTURE  I-STAT TROPOININ, ED  I-STAT CG4 LACTIC ACID, ED  I-STAT CG4 LACTIC ACID, ED    EKG  EKG Interpretation  Date/Time:  Sunday December 19 2015 22:43:25 EDT Ventricular Rate:  138 PR Interval:    QRS Duration: 101 QT Interval:  332 QTC Calculation: 507 R  Axis:   -64 Text Interpretation:  Poor quality data, interpretation may be affected Atrial fibrillation LAD, consider left anterior fascicular block Anteroseptal infarct, old Prolonged QT interval Artifact in lead(s) I II III aVR aVL aVF V2 V3 V4 V5 V6 and baseline wander in lead(s) V3 No STEMI.  Confirmed by LONG MD, JOSHUA [phone]) on 12/19/2015 10:54:16 PM       Radiology Dg Chest Port 1 View  Result Date: 12/20/2015 CLINICAL DATA:  Two falls today. Dizziness. Fever and nonproductive cough. EXAM: PORTABLE CHEST 1 VIEW COMPARISON:  12/01/2015 FINDINGS: Generator pack with stimulator leads extending into the left jugular region. Shallow inspiration. Heart size and pulmonary vascularity are normal for technique. Vascular crowding. No focal consolidation or airspace disease. No blunting of costophrenic angles. No pneumothorax. Calcification of the aorta. IMPRESSION: Shallow inspiration with vascular crowding. No evidence of active pulmonary disease. Electronically Signed   By: Lucienne Capers M.D.   On: 12/20/2015 00:16    Procedures .Critical Care Performed by: Varney Biles Authorized by: Varney Biles   Critical care provider statement:    Critical care time (minutes):  90   Critical care time was exclusive of:  Separately billable procedures and treating other patients   Critical care was necessary to treat or prevent imminent or life-threatening deterioration of the following conditions:  Circulatory failure and sepsis   Critical care was time spent personally by me on the following activities:  Blood draw for specimens, development of treatment plan with patient or surrogate, discussions with consultants, evaluation of patient's response to treatment, examination of patient, interpretation of cardiac output measurements, obtaining history from patient or surrogate, ordering and performing treatments and interventions, ordering and review of laboratory studies, ordering and review of  radiographic studies, pulse oximetry, re-evaluation of patient's condition and review of old charts    (including critical care time)  Medications Ordered in ED Medications  diltiazem (CARDIZEM) 100 mg in dextrose 5 % 100 mL (1 mg/mL) infusion (0 mg/hr Intravenous Hold 12/20/15 0328)  sodium  chloride 0.9 % bolus 500 mL (0 mLs Intravenous Stopped 12/20/15 0130)  metoprolol (LOPRESSOR) injection 5 mg (5 mg Intravenous Given 12/19/15 2257)  metoprolol tartrate (LOPRESSOR) tablet 25 mg (25 mg Oral Given 12/19/15 2300)  sodium chloride 0.9 % bolus 1,000 mL (0 mLs Intravenous Stopped 12/20/15 0044)  cefTRIAXone (ROCEPHIN) 1 g in dextrose 5 % 50 mL IVPB (0 g Intravenous Stopped 12/20/15 0256)  sodium chloride 0.9 % bolus 1,000 mL (0 mLs Intravenous Stopped 12/20/15 0449)     Initial Impression / Assessment and Plan / ED Course  I have reviewed the triage vital signs and the nursing notes.  Pertinent labs & imaging results that were available during my care of the patient were reviewed by me and considered in my medical decision making (see chart for details).  Clinical Course  Comment By Time  Pt reassessed: Heart rate in 120's, lactate < 2, UA Pending Gwenlyn Fudge 10/02 0053  Pt's MAP has been fluctuating, and 100 am went below 65. There has been BP in the 80s since then as well. The clinical picture was tough - as Afib with RVR could be causing the low BP. So until now, the thinking was that the MAP's <65/ SBP's <90, with the information available to [place], was related to Afib. UA resulted few minutes ago, and it is +. I still think the driving force behind the low pressure is Afib, but the underlying process for the afib with RVR is likely sepsis - so we will give more fluids. Pt has CHF, BNP is elevated, so far 1.5 liters was given. We will give 1 more liter, which will get her to 30 cc/kg. She already had blood cultures and antibiotics given. Lactate < 4. Besides the intermittently dropping pressures,  which are not sustained, pt has no other signs of end organ damage, and so for now she is severe sepsis. If BP remains low despite 30cc/kg, then we will consider staring pressors.  Varney Biles, MD 10/02 0318  BP is better now - 110 SBP. She is tachycardic - but mostly < 120. No dilt drip at the moment. Awaiting stepdown bed. Varney Biles, MD 10/02 [phone]   @4 :00 am Sepsis - Repeat Assessment  Performed at:    4 am  Vitals     Blood pressure 115/58, pulse 108, temperature 98.8 F (37.1 C), temperature source Rectal, resp. rate 18, SpO2 97 %.  Heart:     Irregular rate and rhythm and Tachycardic   Lungs:    CTA  Capillary Refill:   <2 sec  Peripheral Pulse:   Radial pulse palpable  Skin:     Normal Color     I personally performed the services described in this documentation, which was scribed in my presence. The recorded information has been reviewed and is accurate.  Pt comes in with cc of chest tightness, vertigo.  Neuro exam is non focal, no nystagmus. Vertigo is positional. Most likely she is having peripheral vertigo.  Pt is in afib with RVR, and her BP is in the 90s -100s SBP range. There is a possibility that she is having some perfusion defects with certain position, leading to vertigo as well - but again, with severe, positional vertigo - peripheral cause more likely.  Not sure why she is in afib with RVR. Recent admission - her metoprolol and flecainide were stopped, so they might be the cause. Other possibility is infection, and there was uti last time.  Chest tightness likely related  to her RVR. There is no signs of over fluid load on exam.  Final Clinical Impressions(s) / ED Diagnoses   Final diagnoses:  Atrial fibrillation with RVR ([place])  Vertigo  Severe sepsis [facility])    New Prescriptions New Prescriptions   No medications on file     Varney Biles, MD 12/20/15 [phone]

## 2015-12-19 NOTE — ED Triage Notes (Signed)
Per EMS: Pt fall x 2 today. Pt complaining of dizziness. Pt states room is spinning. Pt with brain stimulator. Pt denies any pain. Pt febrile upon arrival. Pt complaining of non-productive cough x 1 week. CBG = 232.

## 2015-12-20 ENCOUNTER — Encounter (HOSPITAL_COMMUNITY): Payer: Self-pay | Admitting: Family Medicine

## 2015-12-20 DIAGNOSIS — E1140 Type 2 diabetes mellitus with diabetic neuropathy, unspecified: Secondary | ICD-10-CM | POA: Diagnosis present

## 2015-12-20 DIAGNOSIS — N184 Chronic kidney disease, stage 4 (severe): Secondary | ICD-10-CM | POA: Diagnosis present

## 2015-12-20 DIAGNOSIS — N3000 Acute cystitis without hematuria: Secondary | ICD-10-CM

## 2015-12-20 DIAGNOSIS — G20 Parkinson's disease: Secondary | ICD-10-CM | POA: Diagnosis present

## 2015-12-20 DIAGNOSIS — F329 Major depressive disorder, single episode, unspecified: Secondary | ICD-10-CM | POA: Diagnosis present

## 2015-12-20 DIAGNOSIS — I5032 Chronic diastolic (congestive) heart failure: Secondary | ICD-10-CM

## 2015-12-20 DIAGNOSIS — J449 Chronic obstructive pulmonary disease, unspecified: Secondary | ICD-10-CM | POA: Diagnosis present

## 2015-12-20 DIAGNOSIS — R42 Dizziness and giddiness: Secondary | ICD-10-CM | POA: Diagnosis present

## 2015-12-20 DIAGNOSIS — R001 Bradycardia, unspecified: Secondary | ICD-10-CM | POA: Diagnosis present

## 2015-12-20 DIAGNOSIS — I480 Paroxysmal atrial fibrillation: Secondary | ICD-10-CM | POA: Diagnosis present

## 2015-12-20 DIAGNOSIS — N390 Urinary tract infection, site not specified: Secondary | ICD-10-CM | POA: Diagnosis present

## 2015-12-20 DIAGNOSIS — J9610 Chronic respiratory failure, unspecified whether with hypoxia or hypercapnia: Secondary | ICD-10-CM | POA: Diagnosis present

## 2015-12-20 DIAGNOSIS — I130 Hypertensive heart and chronic kidney disease with heart failure and stage 1 through stage 4 chronic kidney disease, or unspecified chronic kidney disease: Secondary | ICD-10-CM | POA: Diagnosis present

## 2015-12-20 DIAGNOSIS — I5033 Acute on chronic diastolic (congestive) heart failure: Secondary | ICD-10-CM | POA: Diagnosis present

## 2015-12-20 DIAGNOSIS — N183 Chronic kidney disease, stage 3 (moderate): Secondary | ICD-10-CM

## 2015-12-20 DIAGNOSIS — I10 Essential (primary) hypertension: Secondary | ICD-10-CM

## 2015-12-20 DIAGNOSIS — I4891 Unspecified atrial fibrillation: Secondary | ICD-10-CM | POA: Diagnosis not present

## 2015-12-20 DIAGNOSIS — I2510 Atherosclerotic heart disease of native coronary artery without angina pectoris: Secondary | ICD-10-CM | POA: Diagnosis present

## 2015-12-20 DIAGNOSIS — D649 Anemia, unspecified: Secondary | ICD-10-CM | POA: Diagnosis present

## 2015-12-20 DIAGNOSIS — A419 Sepsis, unspecified organism: Secondary | ICD-10-CM | POA: Diagnosis present

## 2015-12-20 DIAGNOSIS — E1122 Type 2 diabetes mellitus with diabetic chronic kidney disease: Secondary | ICD-10-CM | POA: Diagnosis present

## 2015-12-20 DIAGNOSIS — I481 Persistent atrial fibrillation: Secondary | ICD-10-CM | POA: Diagnosis present

## 2015-12-20 DIAGNOSIS — E785 Hyperlipidemia, unspecified: Secondary | ICD-10-CM | POA: Diagnosis present

## 2015-12-20 DIAGNOSIS — F419 Anxiety disorder, unspecified: Secondary | ICD-10-CM | POA: Diagnosis present

## 2015-12-20 LAB — URINE MICROSCOPIC-ADD ON

## 2015-12-20 LAB — CBC
HEMATOCRIT: 26.4 % — AB (ref 36.0–46.0)
HEMOGLOBIN: 8.4 g/dL — AB (ref 12.0–15.0)
MCH: 27.6 pg (ref 26.0–34.0)
MCHC: 31.8 g/dL (ref 30.0–36.0)
MCV: 86.8 fL (ref 78.0–100.0)
Platelets: 197 10*3/uL (ref 150–400)
RBC: 3.04 MIL/uL — ABNORMAL LOW (ref 3.87–5.11)
RDW: 14.5 % (ref 11.5–15.5)
WBC: 8.3 10*3/uL (ref 4.0–10.5)

## 2015-12-20 LAB — HEPATIC FUNCTION PANEL
ALBUMIN: 3.3 g/dL — AB (ref 3.5–5.0)
ALT: 13 U/L — AB (ref 14–54)
AST: 19 U/L (ref 15–41)
Alkaline Phosphatase: 37 U/L — ABNORMAL LOW (ref 38–126)
Bilirubin, Direct: 0.1 mg/dL (ref 0.1–0.5)
Indirect Bilirubin: 0.7 mg/dL (ref 0.3–0.9)
Total Bilirubin: 0.8 mg/dL (ref 0.3–1.2)
Total Protein: 6.7 g/dL (ref 6.5–8.1)

## 2015-12-20 LAB — URINALYSIS, ROUTINE W REFLEX MICROSCOPIC
BILIRUBIN URINE: NEGATIVE
GLUCOSE, UA: NEGATIVE mg/dL
HGB URINE DIPSTICK: NEGATIVE
Ketones, ur: NEGATIVE mg/dL
Leukocytes, UA: NEGATIVE
Nitrite: POSITIVE — AB
PROTEIN: NEGATIVE mg/dL
Specific Gravity, Urine: 1.01 (ref 1.005–1.030)
pH: 5.5 (ref 5.0–8.0)

## 2015-12-20 LAB — BRAIN NATRIURETIC PEPTIDE: B NATRIURETIC PEPTIDE 5: 800.7 pg/mL — AB (ref 0.0–100.0)

## 2015-12-20 LAB — I-STAT CG4 LACTIC ACID, ED: LACTIC ACID, VENOUS: 0.76 mmol/L (ref 0.5–1.9)

## 2015-12-20 LAB — TROPONIN I: Troponin I: <0.03 ng/mL (ref <0.03)

## 2015-12-20 LAB — GLUCOSE, CAPILLARY
Glucose-Capillary: 265 mg/dL — ABNORMAL HIGH (ref 65–99)
Glucose-Capillary: 167 mg/dL — ABNORMAL HIGH (ref 65–99)
Glucose-Capillary: 164 mg/dL — ABNORMAL HIGH (ref 65–99)

## 2015-12-20 LAB — TSH: TSH: 1.575 u[IU]/mL (ref 0.350–4.500)

## 2015-12-20 LAB — MRSA PCR SCREENING: MRSA BY PCR: POSITIVE — AB

## 2015-12-20 LAB — MAGNESIUM: MAGNESIUM: 2.2 mg/dL (ref 1.7–2.4)

## 2015-12-20 MED ORDER — CITALOPRAM HYDROBROMIDE 20 MG PO TABS
20.0000 mg | ORAL_TABLET | Freq: Every day | ORAL | Status: DC
  Administered 2015-12-20 – 2015-12-23 (×4): 20 mg via ORAL
  Filled 2015-12-20 (×4): qty 1

## 2015-12-20 MED ORDER — TRAZODONE HCL 50 MG PO TABS
50.0000 mg | ORAL_TABLET | Freq: Every evening | ORAL | Status: DC | PRN
  Administered 2015-12-22: 50 mg via ORAL
  Filled 2015-12-20: qty 1

## 2015-12-20 MED ORDER — CARBIDOPA-LEVODOPA 25-100 MG PO TABS
1.0000 | ORAL_TABLET | Freq: Three times a day (TID) | ORAL | Status: DC
  Administered 2015-12-20 – 2015-12-23 (×10): 1 via ORAL
  Filled 2015-12-20 (×10): qty 1

## 2015-12-20 MED ORDER — WHITE PETROLATUM GEL: Status: DC | PRN

## 2015-12-20 MED ORDER — SALINE SPRAY 0.65 % NA SOLN
1.0000 | NASAL | Status: DC | PRN
  Filled 2015-12-20: qty 44

## 2015-12-20 MED ORDER — PROMETHAZINE HCL 25 MG PO TABS: 12.5000 mg | ORAL_TABLET | Freq: Four times a day (QID) | ORAL | Status: DC | PRN

## 2015-12-20 MED ORDER — ATORVASTATIN CALCIUM 20 MG PO TABS
20.0000 mg | ORAL_TABLET | Freq: Every day | ORAL | Status: DC
  Administered 2015-12-20 – 2015-12-22 (×3): 20 mg via ORAL
  Filled 2015-12-20 (×3): qty 1

## 2015-12-20 MED ORDER — POTASSIUM CHLORIDE CRYS ER 20 MEQ PO TBCR
40.0000 meq | EXTENDED_RELEASE_TABLET | Freq: Once | ORAL | Status: AC
  Administered 2015-12-20: 40 meq via ORAL
  Filled 2015-12-20: qty 2

## 2015-12-20 MED ORDER — CHLORHEXIDINE GLUCONATE CLOTH 2 % EX PADS
6.0000 | MEDICATED_PAD | Freq: Every day | CUTANEOUS | Status: DC
  Administered 2015-12-21 – 2015-12-23 (×3): 6 via TOPICAL

## 2015-12-20 MED ORDER — AMIODARONE HCL IN DEXTROSE 360-4.14 MG/200ML-% IV SOLN
60.0000 mg/h | INTRAVENOUS | Status: AC
  Administered 2015-12-20: 60 mg/h via INTRAVENOUS
  Filled 2015-12-20: qty 200

## 2015-12-20 MED ORDER — AMIODARONE HCL IN DEXTROSE 360-4.14 MG/200ML-% IV SOLN
30.0000 mg/h | INTRAVENOUS | Status: DC
  Administered 2015-12-20 – 2015-12-23 (×5): 30 mg/h via INTRAVENOUS
  Filled 2015-12-20 (×6): qty 200

## 2015-12-20 MED ORDER — POTASSIUM CHLORIDE CRYS ER 20 MEQ PO TBCR
40.0000 meq | EXTENDED_RELEASE_TABLET | Freq: Every day | ORAL | Status: DC
  Administered 2015-12-21 – 2015-12-22 (×2): 40 meq via ORAL
  Filled 2015-12-20 (×2): qty 2

## 2015-12-20 MED ORDER — METOPROLOL TARTRATE 25 MG PO TABS: 25.0000 mg | ORAL_TABLET | Freq: Two times a day (BID) | ORAL | Status: DC

## 2015-12-20 MED ORDER — RIVAROXABAN 15 MG PO TABS
15.0000 mg | ORAL_TABLET | Freq: Every day | ORAL | Status: DC
  Administered 2015-12-20 – 2015-12-22 (×3): 15 mg via ORAL
  Filled 2015-12-20 (×3): qty 1

## 2015-12-20 MED ORDER — DEXTROSE 5 % IV SOLN
1.0000 g | Freq: Once | INTRAVENOUS | Status: AC
  Administered 2015-12-20: 1 g via INTRAVENOUS
  Filled 2015-12-20: qty 10

## 2015-12-20 MED ORDER — GABAPENTIN 100 MG PO CAPS
100.0000 mg | ORAL_CAPSULE | Freq: Three times a day (TID) | ORAL | Status: DC
  Administered 2015-12-20 – 2015-12-23 (×10): 100 mg via ORAL
  Filled 2015-12-20 (×10): qty 1

## 2015-12-20 MED ORDER — ASPIRIN EC 81 MG PO TBEC
81.0000 mg | DELAYED_RELEASE_TABLET | Freq: Every day | ORAL | Status: DC
  Administered 2015-12-20 – 2015-12-23 (×4): 81 mg via ORAL
  Filled 2015-12-20 (×4): qty 1

## 2015-12-20 MED ORDER — SODIUM CHLORIDE 0.9 % IV BOLUS (SEPSIS)
1000.0000 mL | Freq: Once | INTRAVENOUS | Status: AC
Start: 2015-12-20 — End: 2015-12-20
  Administered 2015-12-20: 1000 mL via INTRAVENOUS

## 2015-12-20 MED ORDER — ACETAMINOPHEN 650 MG RE SUPP: 650.0000 mg | Freq: Four times a day (QID) | RECTAL | Status: DC | PRN

## 2015-12-20 MED ORDER — MECLIZINE HCL 12.5 MG PO TABS
12.5000 mg | ORAL_TABLET | Freq: Two times a day (BID) | ORAL | Status: DC | PRN
  Filled 2015-12-20: qty 1

## 2015-12-20 MED ORDER — INSULIN ASPART 100 UNIT/ML ~~LOC~~ SOLN: 0.0000 [IU] | Freq: Every day | SUBCUTANEOUS | Status: DC

## 2015-12-20 MED ORDER — INSULIN ASPART 100 UNIT/ML ~~LOC~~ SOLN
0.0000 [IU] | Freq: Three times a day (TID) | SUBCUTANEOUS | Status: DC
  Administered 2015-12-20: 2 [IU] via SUBCUTANEOUS
  Administered 2015-12-20: 5 [IU] via SUBCUTANEOUS
  Administered 2015-12-21 – 2015-12-23 (×6): 2 [IU] via SUBCUTANEOUS

## 2015-12-20 MED ORDER — ROPINIROLE HCL 0.25 MG PO TABS
0.2500 mg | ORAL_TABLET | Freq: Three times a day (TID) | ORAL | Status: DC
  Administered 2015-12-20 – 2015-12-23 (×10): 0.25 mg via ORAL
  Filled 2015-12-20 (×11): qty 1

## 2015-12-20 MED ORDER — FUROSEMIDE 10 MG/ML IJ SOLN
40.0000 mg | Freq: Two times a day (BID) | INTRAMUSCULAR | Status: DC
  Administered 2015-12-20 – 2015-12-22 (×5): 40 mg via INTRAVENOUS
  Filled 2015-12-20 (×5): qty 4

## 2015-12-20 MED ORDER — AMIODARONE LOAD VIA INFUSION
150.0000 mg | Freq: Once | INTRAVENOUS | Status: AC
  Administered 2015-12-20: 150 mg via INTRAVENOUS
  Filled 2015-12-20: qty 83.34

## 2015-12-20 MED ORDER — CEFTRIAXONE SODIUM 1 G IJ SOLR
1.0000 g | INTRAMUSCULAR | Status: DC
  Administered 2015-12-20 – 2015-12-22 (×3): 1 g via INTRAVENOUS
  Filled 2015-12-20 (×4): qty 10

## 2015-12-20 MED ORDER — DILTIAZEM HCL 100 MG IV SOLR: 5.0000 mg/h | Freq: Once | INTRAVENOUS | Status: DC

## 2015-12-20 MED ORDER — MUPIROCIN 2 % EX OINT
1.0000 "application " | TOPICAL_OINTMENT | Freq: Two times a day (BID) | CUTANEOUS | Status: DC
  Administered 2015-12-20 – 2015-12-23 (×7): 1 via NASAL
  Filled 2015-12-20: qty 22

## 2015-12-20 MED ORDER — ACETAMINOPHEN 325 MG PO TABS
650.0000 mg | ORAL_TABLET | Freq: Four times a day (QID) | ORAL | Status: DC | PRN
  Administered 2015-12-23: 650 mg via ORAL
  Filled 2015-12-20: qty 2

## 2015-12-20 NOTE — ED Notes (Signed)
Did not complete standing orthostatic vitals due to weakness.

## 2015-12-20 NOTE — Progress Notes (Signed)
PROGRESS NOTE    DONN MASTIN  [id number] DOB: [birth year] DOA: 12/19/2015 PCP: Maris Berger, MD      Brief Narrative:  Melissa Powers is a 70 y.o. female with a past medical history significant for HFpEF, Afib on Xarelto, CKD Stage III, IDDM, HTN, anemia, and Parkinson's disease who presents with 1 week worsening malaise and 1 day vertigo, falling, chest pain. The patient first started feeling worse than usual about a week ago, with worsening intermittent tachycardia and vertigo and vague malaise.  Then, over the last 24 hours, she has noticed fever, shortness of breath, intermittent chest pain, foul-smelling urine, urinary incontinence, and finally severe vertigo/spinning (this had been present for >2 weeks) and so she fell twice and then came to the ER. Patient was admitted for sepsis secondary to presumed UTI, atrial fibrillation.   Assessment & Plan:   Principal Problem:   Sepsis, unspecified organism [facility]) Active Problems:   Essential hypertension   Parkinson's disease ([place])   Chronic diastolic CHF (congestive heart failure) ([facility])   CKD (chronic kidney disease) stage 4, GFR 15-29 ml/min ([facility])   Diabetes mellitus type 2, controlled ([facility])   Positional vertigo   UTI (urinary tract infection)   Paroxysmal atrial fibrillation ([facility])   Atrial fibrillation with RVR ([facility])  Sepsis secondary to UTI, POA  -Urine culture pending -Blood cultures drawn -IVF bolus in ED  -Ceftriaxone   Atrial fibrillation with RVR -CHADS2Vasc 5 -Given diltiazem gtt in ED. Stopped prior to admission since HR better controlled and BP low  -Consult to Cardiology, appreciate recommendations -Amio gtt  -Continue Xarelto -Tele  Chronic diastolic CHF -EF [id number] in August -Consult to Cardiology, appreciate recommendations -Lasix -I's and O's, daily weight  Parkinson's disease -Continue Sinemet -Continue Requip  IDDM -No longer on Januvia or Invokana -SSI with ac/hs  -Continue Neurontin -Check  a1c   HTN -Hypotensive at admission because of atrial fibrillation, diastolic dysfunction. -Hold valsartan, hydralazine and amlodipine until hemodynamics clearer -Continue metoprolol  CAD -Asa  -Lipitor  CKD stage 4 -Baseline Cr 2-2.5 -Stable, monitor   Depression -Celexa  Vertigo -Start antivert    DVT prophylaxis: xarelto  Code Status: FULL  Family Communication: husband at bedside Disposition Plan: continue diuresis, antibiotics.   Consultants:   Cardiology   Procedures:   None  Antimicrobials:   Ceftriaxone 10/2 >>    Subjective: States she feels terrible as she hasnt gotten any rest or food since last night in the Ed. States she has had vertigo "for some time" but no one has helped her with it. Also complains of chest pressure that's been on going as well as cough which is baseline. No other complaints.   Objective: Vitals:   12/20/15 0830 12/20/15 0845 12/20/15 1002 12/20/15 1137  BP: 121/57 130/78 96/72 (!) 155/137  Pulse: 105 112 (!) 121 (!) 122  Resp: 24 21 (!) 22 (!) 29  Temp:   98.4 F (36.9 C) 98.1 F (36.7 C)  TempSrc:   Oral Oral  SpO2: 99% 100% 100% 98%    Intake/Output Summary (Last 24 hours) at 12/20/15 1411 Last data filed at 12/20/15 0449  Gross per 24 hour  Intake             2100 ml  Output                0 ml  Net             2100 ml   There were no vitals  filed for this visit.  Examination:  General exam: Appears calm and comfortable  Respiratory system: Clear to auscultation. Respiratory effort normal. Cardiovascular system: S1 & S2 heard, tachycardic, irreg. No pedal edema. Gastrointestinal system: Abdomen is nondistended, soft and nontender. No organomegaly or masses felt. Normal bowel sounds heard. Central nervous system: Alert and oriented. No focal neurological deficits. Extremities: Symmetric 5 x 5 power. Skin: No rashes, lesions or ulcers Psychiatry: Judgement and insight appear normal. Mood & affect  appropriate.   Data Reviewed: I have personally reviewed following labs and imaging studies  CBC:  Recent Labs Lab 12/19/15 2239 12/19/15 2314 12/20/15 1115  WBC 10.9*  --  8.3  NEUTROABS  --  8.9*  --   HGB 9.1*  --  8.4*  HCT 29.5*  --  26.4*  MCV 87.8  --  86.8  PLT 220  --  [id number]   Basic Metabolic Panel:  Recent Labs Lab 12/19/15 2239 12/20/15 1115  NA 133*  --   K 3.7  --   CL 94*  --   CO2 28  --   GLUCOSE 199*  --   BUN 95*  --   CREATININE 2.05*  --   CALCIUM 9.5  --   MG  --  2.2   GFR: CrCl cannot be calculated (Unknown ideal weight.). Liver Function Tests:  Recent Labs Lab 12/20/15 1115  AST 19  ALT 13*  ALKPHOS 37*  BILITOT 0.8  PROT 6.7  ALBUMIN 3.3*   No results for input(s): LIPASE, AMYLASE in the last 168 hours. No results for input(s): AMMONIA in the last 168 hours. Coagulation Profile: No results for input(s): INR, PROTIME in the last 168 hours. Cardiac Enzymes:  Recent Labs Lab 12/20/15 1115  TROPONINI <0.03   BNP (last 3 results) No results for input(s): PROBNP in the last 8760 hours. HbA1C: No results for input(s): HGBA1C in the last 72 hours. CBG:  Recent Labs Lab 12/20/15 1227  GLUCAP 265*   Lipid Profile: No results for input(s): CHOL, HDL, LDLCALC, TRIG, CHOLHDL, LDLDIRECT in the last 72 hours. Thyroid Function Tests:  Recent Labs  12/20/15 1115  TSH 1.575   Anemia Panel: No results for input(s): VITAMINB12, FOLATE, FERRITIN, TIBC, IRON, RETICCTPCT in the last 72 hours. Sepsis Labs:  Recent Labs Lab 12/20/15 0016  LATICACIDVEN 0.76    No results found for this or any previous visit (from the past 240 hour(s)).       Radiology Studies: Dg Chest Port 1 View  Result Date: 12/20/2015 CLINICAL DATA:  Two falls today. Dizziness. Fever and nonproductive cough. EXAM: PORTABLE CHEST 1 VIEW COMPARISON:  12/01/2015 FINDINGS: Generator pack with stimulator leads extending into the left jugular region. Shallow  inspiration. Heart size and pulmonary vascularity are normal for technique. Vascular crowding. No focal consolidation or airspace disease. No blunting of costophrenic angles. No pneumothorax. Calcification of the aorta. IMPRESSION: Shallow inspiration with vascular crowding. No evidence of active pulmonary disease. Electronically Signed   By: Lucienne Capers M.D.   On: 12/20/2015 00:16        Scheduled Meds: . aspirin EC  81 mg Oral Daily  . atorvastatin  20 mg Oral q1800  . carbidopa-levodopa  1 tablet Oral TID  . cefTRIAXone (ROCEPHIN)  IV  1 g Intravenous Q24H  . citalopram  20 mg Oral Daily  . furosemide  40 mg Intravenous BID  . gabapentin  100 mg Oral TID  . insulin aspart  0-5 Units Subcutaneous QHS  .  insulin aspart  0-9 Units Subcutaneous TID WC  . [START ON 12/21/2015] potassium chloride  40 mEq Oral Daily  . Rivaroxaban  15 mg Oral Q supper  . rOPINIRole  0.25 mg Oral TID   Continuous Infusions: . amiodarone 60 mg/hr (12/20/15 1209)   Followed by  . amiodarone       LOS: 0 days    Time spent: 40 minutes    Dessa Phi, DO [facility] Pager [phone]  If 7PM-7AM, please contact night-coverage [email or web address] Password [email or web address] 12/20/2015, 2:11 PM

## 2015-12-20 NOTE — Consult Note (Signed)
Advanced Heart Failure Team Consult Note  Referring Physician: Dr Maylene Roes Primary Physician: Dr Selena Batten Primary Cardiologist:  Dr Haroldine Laws  Reason for Consultation: Heart Failure and AF   HPI:   Melissa Powers is a 70 y.o. female with PMH ofHFpEF, Afib on anticoagulation, CKD Stage 3, T2DM, HTN, HLD, and Parkinson's disease.  Followed closely in the HF clinic and last seen on September 6th. At that time she was euvolemic and remained on torsemide 40 mg/40mg . Weight at that time was 188 pounds. She was also in A fib so she was set up for DC-CV however she spontaneously  converted to NSR on the day of the procedure.   Prior to admit she had 3 falls due to vertigo. Did not pas out  She has not missed any doses of Xarelto. Weight at home 180-185 pounds.   Presented to the ED with fatigue, vertigo and chest pain. The symptoms have intensified over the last last week. In the ED EKG showed A fib 138 bpm.  CXR no acute findings.UA with nitrite. Started on ceftriaxone. BP dropped 88/45 and she received 2.5 liters fluid.   Dyspnea at rest. Denies CP.    Pertinent labs include: UA + nitrites. Troponin 0.0, creatinine 2.05, K 3.7, hgb 9.1, WBC 10.9, BNP 800     Echo 10/20/15 with LVEF 55-60%, mild/mod MR, Mod LAE, mild RVH, mild RAE, PA peak pressure 69 mm Hg Review of Systems: [y] = yes, [ ]  = no   General: Weight gain [ Y]; Weight loss [ ] ; Anorexia [ ] ; Fatigue [Y ]; Fever [ ] ; Chills [ ] ; Weakness [Y ]  Cardiac: Chest pain/pressure [Y ]; Resting SOB [ Y]; Exertional SOB [ Y]; Orthopnea [ ] ; Pedal Edema [ ] ; Palpitations [ ] ; Syncope [ ] ; Presyncope [ ] ; Paroxysmal nocturnal dyspnea[ ]   Pulmonary: Cough [ ] ; Wheezing[ ] ; Hemoptysis[ ] ; Sputum [ ] ; Snoring [ ]   GI: Vomiting[ ] ; Dysphagia[ ] ; Melena[ ] ; Hematochezia [ ] ; Heartburn[ ] ; Abdominal pain [ ] ; Constipation [ ] ; Diarrhea [ ] ; BRBPR [ ]   GU: Hematuria[ ] ; Dysuria [ ] ; Nocturia[ ]   Vascular: Pain in legs with walking [ ] ; Pain in feet with  lying flat [ ] ; Non-healing sores [ ] ; Stroke [ ] ; TIA [ ] ; Slurred speech [ ] ;  Neuro: Headaches[ ] ; Vertigo[Y ]; Seizures[ ] ; Paresthesias[ ] ;Blurred vision [ ] ; Diplopia [ ] ; Vision changes [ ]   Ortho/Skin: Arthritis [Y ]; Joint pain [Y ]; Muscle pain [ ] ; Joint swelling [ ] ; Back Pain [ ] ; Rash [ ]   Psych: Depression[ ] ; Anxiety[ ]   Heme: Bleeding problems [ ] ; Clotting disorders [ ] ; Anemia [ ]   Endocrine: Diabetes Blue.Reese ]; Thyroid dysfunction[ ]   Home Medications Prior to Admission medications   Medication Sig Start Date End Date Taking? Authorizing Provider  amLODipine (NORVASC) 10 MG tablet Take 5 mg by mouth daily.   Yes Historical Provider, MD  aspirin EC 81 MG EC tablet Take 1 tablet (81 mg total) by mouth daily. 10/22/15  Yes Elsia Nigel Sloop, DO  atorvastatin (LIPITOR) 20 MG tablet Take 1 tablet (20 mg total) by mouth daily at 6 PM. 10/22/15  Yes Elsia Nigel Sloop, DO  carbidopa-levodopa (SINEMET IR) 25-100 MG per tablet Take 1 tablet by mouth 3 (three) times daily.   Yes Historical Provider, MD  cholecalciferol (VITAMIN D) 1000 units tablet Take 2,000 Units by mouth daily.   Yes Historical Provider, MD  citalopram (CELEXA) 20 MG tablet  Take 20 mg by mouth daily.   Yes Historical Provider, MD  gabapentin (NEURONTIN) 100 MG capsule Take 300 mg by mouth 3 (three) times daily.    Yes Historical Provider, MD  hydrALAZINE (APRESOLINE) 50 MG tablet Take 50 mg by mouth 4 (four) times daily.   Yes Historical Provider, MD  insulin aspart (NOVOLOG) 100 UNIT/ML injection Inject 10 Units into the skin at bedtime.   Yes Historical Provider, MD  metoprolol tartrate (LOPRESSOR) 25 MG tablet Take 25 mg by mouth 2 (two) times daily.   Yes Historical Provider, MD  Rivaroxaban (XARELTO) 15 MG TABS tablet Take 15 mg by mouth daily with supper.   Yes Historical Provider, MD  rOPINIRole (REQUIP) 0.25 MG tablet Take 1 tablet (0.25 mg total) by mouth 3 (three) times daily. 10/22/15  Yes Elsia Nigel Sloop, DO  torsemide (DEMADEX)  20 MG tablet Take 2 tablets (40 mg total) by mouth 2 (two) times daily. 11/25/15  Yes Shirley Friar, PA-C  traZODone (DESYREL) 50 MG tablet Take 50 mg by mouth at bedtime.    Yes Historical Provider, MD  valsartan (DIOVAN) 320 MG tablet Take 320 mg by mouth daily.   Yes Historical Provider, MD    Past Medical History: Past Medical History:  Diagnosis Date  . Anxiety   . Atrial fibrillation ([place])   . CHF (congestive heart failure) ([place])   . Chronic kidney disease (CKD), stage III (moderate)   . COPD (chronic obstructive pulmonary disease) ([place])   . History of home oxygen therapy    "2.5L; 24/7" (10/19/2015)  . Hyperlipidemia   . Hypertension   . Pneumonia 08/2015  . Sleep apnea    "tried mask; can't wear it" (10/19/2015)  . Tremor   . Type II diabetes mellitus ([place])     Past Surgical History: Past Surgical History:  Procedure Laterality Date  . ABDOMINAL HYSTERECTOMY    . TUBAL LIGATION      Family History: Family History  Problem Relation Age of Onset  . Dementia Mother   . Heart Problems Father   . Stroke Sister   . Kidney failure Brother     on ESRD    Social History: Social History   Social History  . Marital status: Married    Spouse name: N/A  . Number of children: N/A  . Years of education: N/A   Social History Main Topics  . Smoking status: Never Smoker  . Smokeless tobacco: Never Used  . Alcohol use No  . Drug use: No  . Sexual activity: Not Currently   Other Topics Concern  . None   Social History Narrative  . None    Allergies:  Allergies  Allergen Reactions  . Morphine And Related Other (See Comments)    Pt reports intolerance due to sedation from morphine    Objective:    Vital Signs:   Temp:  [98.4 F (36.9 C)-98.8 F (37.1 C)] 98.4 F (36.9 C) (10/02 1002) Pulse Rate:  [25-134] 121 (10/02 1002) Resp:  [16-27] 22 (10/02 1002) BP: (88-140)/(35-96) 96/72 (10/02 1002) SpO2:  [86 %-100 %] 100 % (10/02 1002)    Weight  change: There were no vitals filed for this visit.  Intake/Output:   Intake/Output Summary (Last 24 hours) at 12/20/15 1031 Last data filed at 12/20/15 0449  Gross per 24 hour  Intake             2100 ml  Output  0 ml  Net             2100 ml     Physical Exam: General:  Dyspneic at rest.  HEENT: normal Neck: supple. JVP ~10 . Carotids 2+ bilat; no bruits. No lymphadenopathy or thryomegaly appreciated. Cor: PMI nondisplaced. Irregular rate & rhythm. No rubs, gallops or murmurs. Lungs: clear Abdomen: obese, soft, nontender, nondistended. No hepatosplenomegaly. No bruits or masses. Good bowel sounds. Extremities: no cyanosis, clubbing, rash, 1+ edema Neuro: alert & orientedx3, cranial nerves grossly intact. moves all 4 extremities w/o difficulty. Affect pleasant  Telemetry:  A Fib 100s   Labs: Basic Metabolic Panel:  Recent Labs Lab 12/19/15 2239  NA 133*  K 3.7  CL 94*  CO2 28  GLUCOSE 199*  BUN 95*  CREATININE 2.05*  CALCIUM 9.5    Liver Function Tests: No results for input(s): AST, ALT, ALKPHOS, BILITOT, PROT, ALBUMIN in the last 168 hours. No results for input(s): LIPASE, AMYLASE in the last 168 hours. No results for input(s): AMMONIA in the last 168 hours.  CBC:  Recent Labs Lab 12/19/15 2239 12/19/15 2314  WBC 10.9*  --   NEUTROABS  --  8.9*  HGB 9.1*  --   HCT 29.5*  --   MCV 87.8  --   PLT 220  --     Cardiac Enzymes: No results for input(s): CKTOTAL, CKMB, CKMBINDEX, TROPONINI in the last 168 hours.  BNP: BNP (last 3 results)  Recent Labs  10/19/15 1129 11/24/15 1222 12/19/15 2320  BNP 1,057.9* 247.2* 800.7*    ProBNP (last 3 results) No results for input(s): PROBNP in the last 8760 hours.   CBG: No results for input(s): GLUCAP in the last 168 hours.  Coagulation Studies: No results for input(s): LABPROT, INR in the last 72 hours.  Other results: EKG: A fib RVR 138 bpm  Imaging: Dg Chest Port 1 View  Result  Date: 12/20/2015 CLINICAL DATA:  Two falls today. Dizziness. Fever and nonproductive cough. EXAM: PORTABLE CHEST 1 VIEW COMPARISON:  12/01/2015 FINDINGS: Generator pack with stimulator leads extending into the left jugular region. Shallow inspiration. Heart size and pulmonary vascularity are normal for technique. Vascular crowding. No focal consolidation or airspace disease. No blunting of costophrenic angles. No pneumothorax. Calcification of the aorta. IMPRESSION: Shallow inspiration with vascular crowding. No evidence of active pulmonary disease. Electronically Signed   By: Lucienne Capers M.D.   On: 12/20/2015 00:16      Medications:     Current Medications: . aspirin EC  81 mg Oral Daily  . atorvastatin  20 mg Oral q1800  . carbidopa-levodopa  1 tablet Oral TID  . cefTRIAXone (ROCEPHIN)  IV  1 g Intravenous Q24H  . citalopram  20 mg Oral Daily  . gabapentin  100 mg Oral TID  . insulin aspart  0-5 Units Subcutaneous QHS  . insulin aspart  0-9 Units Subcutaneous TID WC  . metoprolol tartrate  25 mg Oral BID  . potassium chloride  40 mEq Oral Once  . Rivaroxaban  15 mg Oral Q supper  . rOPINIRole  0.25 mg Oral TID     Infusions:      Assessment:  1. Chest Pain 2. Vertigo 3. UTI 4. A Fib RVR --This patients CHA2DS2-VASc = 6 5. Acute/Chronic diastolic CHF - Echo 123456 LVEF 55-60% 6. Chronic respiratory failure 7. CKD stage III- Creatinine baseline 1.9-2.2 8. T2DM with neuropathy 9. HTN 10  HLD 11. Anxiety 12. Parkinsons with falls and  weakness.    Plan/Discussion:    Melissa Powers is well known to the HF team and has been followed closely. Admitted with vertigo, fatigue and CP. CEs negative . CP resolved. She has been hypotensive which may be related to UTI. UA + nitrites and bacteria. Urine culture pending. Continue ceftriaxone.     On exam, she has mild volume overload but not surprising as she has received IV fluids and she is in A fib RVR. Start lasix 40 mg IV twice  daily for now. Give 40 meq potassium. Watch renal function closely. Today's creatinine 2.05.   She has been in A fib RVR since admit. BP soft. Stop metoprolol and start amio drip. Hopefully she will convert to NSR if not may be able to cardiovert. Continue Xarelto 15 mg daily.   Vertigo has been an ongoing problem. Primary team working.   Length of Stay: 0  Amy Clegg NP-C  12/20/2015, 10:31 AM  Advanced Heart Failure Team Pager [phone] (M-F; 7a - 4p)  Please contact [facility] for night-coverage after hours (4p -7a ) and weekends on [email or web address]  Patient seen and examined with Darrick Grinder, NP. We discussed all aspects of the encounter. I agree with the assessment and plan as stated above.   She has mild volume overload and recurrent AF with RVR. Agree with IV amio load. Plan DC-CV on Wednesday if she does not convert prior. Continue IV diuresis. Watch renal function. Treatment of UTI per primary team.   Gagandeep Pettet,MD 9:55 PM

## 2015-12-20 NOTE — Progress Notes (Signed)
  Amiodarone Drug - Drug Interaction Consult Note  Recommendations: Monitor QTc with amiodarone and citalopram  Atorvastatin dose  and amiodarone  - ok  Amiodarone is metabolized by the cytochrome P450 system and therefore has the potential to cause many drug interactions. Amiodarone has an average plasma half-life of 50 days (range 20 to 100 days).   There is potential for drug interactions to occur several weeks or months after stopping treatment and the onset of drug interactions may be slow after initiating amiodarone.   [x]  Statins: Increased risk of myopathy. Simvastatin- restrict dose to 20mg  daily. Other statins: counsel patients to report any muscle pain or weakness immediately.  []  Anticoagulants: Amiodarone can increase anticoagulant effect. Consider warfarin dose reduction. Patients should be monitored closely and the dose of anticoagulant altered accordingly, remembering that amiodarone levels take several weeks to stabilize.  []  Antiepileptics: Amiodarone can increase plasma concentration of phenytoin, the dose should be reduced. Note that small changes in phenytoin dose can result in large changes in levels. Monitor patient and counsel on signs of toxicity.  []  Beta blockers: increased risk of bradycardia, AV block and myocardial depression. Sotalol - avoid concomitant use.  []   Calcium channel blockers (diltiazem and verapamil): increased risk of bradycardia, AV block and myocardial depression.  []   Cyclosporine: Amiodarone increases levels of cyclosporine. Reduced dose of cyclosporine is recommended.  []  Digoxin dose should be halved when amiodarone is started.  []  Diuretics: increased risk of cardiotoxicity if hypokalemia occurs.  []  Oral hypoglycemic agents (glyburide, glipizide, glimepiride): increased risk of hypoglycemia. Patient's glucose levels should be monitored closely when initiating amiodarone therapy.   [x]  Drugs that prolong the QT interval:  Torsades de  pointes risk may be increased with concurrent use - avoid if possible.  Monitor QTc, also keep magnesium/potassium WNL if concurrent therapy can't be avoided. Marland Kitchen Antibiotics: e.g. fluoroquinolones, erythromycin. . Antiarrhythmics: e.g. quinidine, procainamide, disopyramide, sotalol. . Antipsychotics: e.g. phenothiazines, haloperidol.  . Lithium, tricyclic antidepressants, and methadone. Thank You,    Bonnita Nasuti Pharm.D. CPP, BCPS Clinical Pharmacist [phone] 12/20/2015 11:10 AM

## 2015-12-20 NOTE — ED Notes (Addendum)
Report given to Justice Rocher, RN on [facility]

## 2015-12-20 NOTE — H&P (Signed)
History and Physical  Patient Name: Melissa Powers     [id number]    DOB: 1945/11/30    DOA: 12/19/2015 PCP: Maris Berger, MD   Patient coming from: Home  Chief Complaint: Vertigo, malaise, chest pain  HPI: Melissa Powers is a 70 y.o. female with a past medical history significant for HFpEF, Afib on Xarelto, CKD Stage III, IDDM, HTN, anemia, and Parkinson's disease who presents with 1 week worsening malaise and 1 day vertigo, falling, chest pain.  The patient first started feeling worse than usual about a week ago, with worsening intermittent tachycardia and vertigo and vague malaise.  Then, over the last 24 hours, she has noticed fever, shortness of breath, intermittent chest pain, foul-smelling urine, urinary incontinence, and finally severe vertigo/spinning (this had been present for >2 weeks) and so she fell twice and then came to the ER.  ED course: -Afebrile, heart rate 130s, respirations 20s, blood pressure 110/60 initially, pulse oximetry 86% on room air -Na 133, K 3.7, Cr 2.05 (baseline 1.0 last Nov, climbed to 1.5 by April, near 2.0 in last two months), WBC 10.9K, Hgb 9.1 (previous baseline 11-12) -UA with nitrites and bacteria -CXR showed edema and BNP >800 and ECG showed Afib rates in the 130s -Lactic acid normal -She initially had persistent Afib and BP dropped to 88/45 -She was given ceftriaxone and 2.5L NS and her heart rate and BP improved and TRH were asked to evaluate for admission     ROS: Review of Systems  Constitutional: Positive for fever and malaise/fatigue.  Respiratory: Positive for shortness of breath. Negative for cough and sputum production.   Cardiovascular: Positive for chest pain, palpitations and leg swelling.  Genitourinary: Positive for frequency (plus foul-smelling urine and incontinence). Negative for dysuria.  Neurological: Positive for dizziness (vertigo). Negative for loss of consciousness.  All other systems reviewed and are negative.          Past Medical History:  Diagnosis Date  . Anxiety   . Atrial fibrillation ([place])   . CHF (congestive heart failure) ([place])   . Chronic kidney disease (CKD), stage III (moderate)   . COPD (chronic obstructive pulmonary disease) ([place])   . History of home oxygen therapy    "2.5L; 24/7" (10/19/2015)  . Hyperlipidemia   . Hypertension   . Pneumonia 08/2015  . Sleep apnea    "tried mask; can't wear it" (10/19/2015)  . Tremor   . Type II diabetes mellitus ([place])     Past Surgical History:  Procedure Laterality Date  . ABDOMINAL HYSTERECTOMY    . TUBAL LIGATION      Social History: Patient lives alone.  The patient walks unassisted at baseline.  Not a smoker.    Allergies  Allergen Reactions  . Morphine And Related Other (See Comments)    Pt reports intolerance due to sedation from morphine    Family history: family history includes Dementia in her mother; Heart Problems in her father; Kidney failure in her brother; Stroke in her sister.  Prior to Admission medications   Medication Sig Start Date End Date Taking? Authorizing Provider  amLODipine (NORVASC) 10 MG tablet Take 5 mg by mouth daily.   Yes Historical Provider, MD  aspirin EC 81 MG EC tablet Take 1 tablet (81 mg total) by mouth daily. 10/22/15  Yes Bufford Lope, DO  atorvastatin (LIPITOR) 20 MG tablet Take 1 tablet (20 mg total) by mouth daily at 6 PM. 10/22/15  Yes Bufford Lope, DO  carbidopa-levodopa (SINEMET IR) 25-100 MG per tablet Take 1 tablet by mouth 3 (three) times daily.   Yes Historical Provider, MD  citalopram (CELEXA) 20 MG tablet Take 20 mg by mouth daily.   Yes Historical Provider, MD  gabapentin (NEURONTIN) 100 MG capsule Take 100 mg by mouth 3 (three) times daily.   Yes Historical Provider, MD  hydrALAZINE (APRESOLINE) 50 MG tablet Take 50 mg by mouth 4 (four) times daily.   Yes Historical Provider, MD  insulin aspart (NOVOLOG) 100 UNIT/ML injection Inject 10 Units into the skin at bedtime.   Yes Historical  Provider, MD  metoprolol tartrate (LOPRESSOR) 25 MG tablet Take 25 mg by mouth 2 (two) times daily.   Yes Historical Provider, MD  Rivaroxaban (XARELTO) 15 MG TABS tablet Take 15 mg by mouth daily with supper.   Yes Historical Provider, MD  rOPINIRole (REQUIP) 0.25 MG tablet Take 1 tablet (0.25 mg total) by mouth 3 (three) times daily. 10/22/15  Yes Elsia Nigel Sloop, DO  torsemide (DEMADEX) 20 MG tablet Take 2 tablets (40 mg total) by mouth 2 (two) times daily. 11/25/15  Yes Shirley Friar, PA-C  traZODone (DESYREL) 50 MG tablet Take 50 mg by mouth at bedtime as needed for sleep.   Yes Historical Provider, MD  valsartan (DIOVAN) 320 MG tablet Take 320 mg by mouth daily.   Yes Historical Provider, MD       Physical Exam: BP 110/96   Pulse 100   Temp 98.7 F (37.1 C) (Oral)   Resp 17   SpO2 98%  General appearance: Elderly obese adult female, alert and in mild distress from malaise.   Eyes: Anicteric, conjunctiva pink, lids and lashes normal. PERRL.    ENT: No nasal deformity, discharge, epistaxis.  Hearing normal. OP moist without lesions.   Neck: No neck masses.  Trachea midline.  No thyromegaly/tenderness. Lymph: No cervical or supraclavicular lymphadenopathy. Skin: Warm and dry.  No jaundice.  No suspicious rashes or lesions. Cardiac: Tachycardic, irregular, nl S1-S2, no murmurs appreciated.  Capillary refill is brisk.  JVP not visible.  No LE edema.  Radial and DP pulses 2+ and symmetric. Respiratory: Normal respiratory rate and rhythm.  CTAB without rales or wheezes. Abdomen: Abdomen soft.  Mild suprapubic TTP. No ascites, distension, hepatosplenomegaly.   MSK: No deformities or effusions.  No cyanosis or clubbing. Neuro: Cranial nerves normal.  Sensation intact to light touch. Speech is fluent.  Muscle strength normal and symemtric.  No nystagmus.  FTN testing symmetric (tremor mild).    Psych: Sensorium intact and responding to questions, attention normal.  Behavior appropriate.   Affect normal.  Judgment and insight appear normal.     Labs on Admission:  I have personally reviewed following labs and imaging studies: CBC:  Recent Labs Lab 12/19/15 2239 12/19/15 2314  WBC 10.9*  --   NEUTROABS  --  8.9*  HGB 9.1*  --   HCT 29.5*  --   MCV 87.8  --   PLT 220  --    Basic Metabolic Panel:  Recent Labs Lab 12/19/15 2239  NA 133*  K 3.7  CL 94*  CO2 28  GLUCOSE 199*  BUN 95*  CREATININE 2.05*  CALCIUM 9.5   GFR: CrCl cannot be calculated (Unknown ideal weight.).  Liver Function Tests: No results for input(s): AST, ALT, ALKPHOS, BILITOT, PROT, ALBUMIN in the last 168 hours. No results for input(s): LIPASE, AMYLASE in the last 168 hours. No results for input(s): AMMONIA in  the last 168 hours. Coagulation Profile: No results for input(s): INR, PROTIME in the last 168 hours. Cardiac Enzymes: No results for input(s): CKTOTAL, CKMB, CKMBINDEX, TROPONINI in the last 168 hours. BNP (last 3 results) No results for input(s): PROBNP in the last 8760 hours. HbA1C: No results for input(s): HGBA1C in the last 72 hours. CBG: No results for input(s): GLUCAP in the last 168 hours. Lipid Profile: No results for input(s): CHOL, HDL, LDLCALC, TRIG, CHOLHDL, LDLDIRECT in the last 72 hours. Thyroid Function Tests: No results for input(s): TSH, T4TOTAL, FREET4, T3FREE, THYROIDAB in the last 72 hours. Anemia Panel: No results for input(s): VITAMINB12, FOLATE, FERRITIN, TIBC, IRON, RETICCTPCT in the last 72 hours. Sepsis Labs: Lactic acid 0.7 Invalid input(s): PROCALCITONIN, LACTICIDVEN No results found for this or any previous visit (from the past 240 hour(s)).       Radiological Exams on Admission: Personally reviewed and shows small volumes, no definite edema, no pneumonia: Dg Chest Port 1 View  Result Date: 12/20/2015 CLINICAL DATA:  Two falls today. Dizziness. Fever and nonproductive cough. EXAM: PORTABLE CHEST 1 VIEW COMPARISON:  12/01/2015 FINDINGS:  Generator pack with stimulator leads extending into the left jugular region. Shallow inspiration. Heart size and pulmonary vascularity are normal for technique. Vascular crowding. No focal consolidation or airspace disease. No blunting of costophrenic angles. No pneumothorax. Calcification of the aorta. IMPRESSION: Shallow inspiration with vascular crowding. No evidence of active pulmonary disease. Electronically Signed   By: Lucienne Capers M.D.   On: 12/20/2015 00:16    EKG: Independently reviewed. Rate 138, atrial fibrillation.    Assessment/Plan Principal Problem:   Sepsis, unspecified organism [facility]) Active Problems:   Essential hypertension   Parkinson's disease ([place])   Chronic diastolic CHF (congestive heart failure) ([facility])   Chronic kidney disease, stage 3   Diabetes mellitus type 2, controlled ([facility])   UTI (urinary tract infection)   Paroxysmal atrial fibrillation ([place])  1. Sepsis presumed from UTI:  Suspected source urine. Organism unknown. Patient meets criteria given tachycardia, tachypnea, and evidence of organ dysfunction.  Lactate 0.7 mmol/L.  Antibiotics delivered in the ED.  The patient is noted to have a MAP's <65/ SBP's <90. With the current information available to me, I don't think the patient is in septic shock. The MAP's <65/ SBP's <90, is related to atrial fibrillation and diastolic heart dysfunction.  -Sepsis bundle utilized:  -Blood and urine cultures drawn  -Fluid bolus given in ED  -Start targeted antibiotics with ceftriaxone, based on suspected source of infection    -Repeat renal function and complete blood count in AM  -Code SEPSIS called to E-link       2. Atrial fibrillation:  CHADS2Vasc 5.  On Xarelto.  States she no longer takes diltiazem. -Continue Xarelto -Continue metoprolol with hold parameters  3. Chronic diastolic CHF:  EF [id number] in August.  Heart rate and BP improved with fluids.  No volume overload on exam. -Hold torsemide for now -Strict  I/Os, daily weights -Hold hydralazine, valsartan and amlodipine until hemodynamics clearer -Consult to Cardiology, appreciate recommendations  4. Parkinson's disease:  -Continue Sinemet -Continue Requip  5. Other medications:  -Continue gabapentin -Continue citalopram -Continue trazodone for sleep -Continue aspirin and statin  6. IDDM:  No longer on Januvia or Invokana.  States she only takes Novolog 10 units at night. -SSI with meals  7. HTN:  Hypotensive at admission because of atrial fibrillation, diastolic dysfunction. -Hold valsartan, hydralazine and amlodipine until hemodynamics clearer -Continue metoprolol  DVT prophylaxis: N/A, on xarelto  Code Status: FULL  Family Communication: None present  Disposition Plan: Anticipate IV fluids and antibiotics, monitor closely in Stepdown for hemodyncamics.  Cards consulted. Consults called: Cardiology notified overnight Admission status: INPATIENT, stepddown        Medical decision making: Patient seen at 3:40 AM on 12/20/2015.  The patient was discussed with Dr. Kathrynn Humble.  What exists of the patient's chart was reviewed in depth and summarized above.  Clinical condition: requiring additional fluids and close hemodynamic monitoring overnight.        Edwin Dada [facility] Pager [phone]

## 2015-12-20 NOTE — Progress Notes (Signed)
12/20/2015 patient is alert, oriented and ambulatory. Came from the emergency room to [facility] at 0900. Patient have bruises on legs, arms and stated she fell twice at home. She is incontinent of urine. Sacrum no redness, or excoriation heels dry. She received CHG and MRSA swab. Rico Sheehan RN

## 2015-12-20 NOTE — ED Notes (Signed)
Placed pt on 2L O2 [place] due to O2 stats in the high 80's, O2 increased to 98%.

## 2015-12-21 DIAGNOSIS — H8110 Benign paroxysmal vertigo, unspecified ear: Secondary | ICD-10-CM

## 2015-12-21 LAB — GLUCOSE, CAPILLARY
GLUCOSE-CAPILLARY: 174 mg/dL — AB (ref 65–99)
GLUCOSE-CAPILLARY: 184 mg/dL — AB (ref 65–99)
GLUCOSE-CAPILLARY: 177 mg/dL — AB (ref 65–99)
Glucose-Capillary: 179 mg/dL — ABNORMAL HIGH (ref 65–99)

## 2015-12-21 LAB — BASIC METABOLIC PANEL
ANION GAP: 8 (ref 5–15)
BUN: 68 mg/dL — ABNORMAL HIGH (ref 6–20)
CO2: 30 mmol/L (ref 22–32)
Calcium: 9.2 mg/dL (ref 8.9–10.3)
Chloride: 98 mmol/L — ABNORMAL LOW (ref 101–111)
Creatinine, Ser: 1.66 mg/dL — ABNORMAL HIGH (ref 0.44–1.00)
GFR, EST AFRICAN AMERICAN: 35 mL/min — AB (ref >60)
GFR, EST NON AFRICAN AMERICAN: 30 mL/min — AB (ref >60)
Glucose, Bld: 132 mg/dL — ABNORMAL HIGH (ref 65–99)
POTASSIUM: 4.3 mmol/L (ref 3.5–5.1)
SODIUM: 136 mmol/L (ref 135–145)

## 2015-12-21 LAB — URINE CULTURE

## 2015-12-21 LAB — MAGNESIUM: MAGNESIUM: 2.2 mg/dL (ref 1.7–2.4)

## 2015-12-21 MED ORDER — SODIUM CHLORIDE 0.9% FLUSH
3.0000 mL | Freq: Two times a day (BID) | INTRAVENOUS | Status: DC
  Administered 2015-12-21: 3 mL via INTRAVENOUS

## 2015-12-21 MED ORDER — SODIUM CHLORIDE 0.9% FLUSH: 3.0000 mL | INTRAVENOUS | Status: DC | PRN

## 2015-12-21 MED ORDER — SODIUM CHLORIDE 0.9 % IV SOLN: 250.0000 mL | INTRAVENOUS | Status: DC

## 2015-12-21 NOTE — Progress Notes (Signed)
Patient called RN to room, stated she forgot to tell MD Bensimhon about deep brain stimulator she has and that the battery pack is located over her heart. Patient has machine at home to turn off stimulator. Spoke with NP Clegg, about stimulator and says to have machine her to turn stimulator off during cardioversion procedure tomorrow.

## 2015-12-21 NOTE — Care Management Note (Signed)
Case Management Note  Patient Details  Name: Melissa Powers MRN: [id number] Date of Birth: Apr 16, 1945  Subjective/Objective:    Pt lives with spouse, is followed by Dr Haroldine Laws in [facility].  Discussed home heath RN and pt states she goes to [facility] in [place] five days a week, so is not homebound.  She is very satisfied with their program and with f/u with [facility].                              Expected Discharge Plan:  Home/Self Care  Discharge planning Services  CM Consult  HH Arranged:  Patient Refused  Status of Service:  Completed, signed off  Jakyri Brunkhorst, Kym Groom, [place] 12/21/2015, 1:12 PM

## 2015-12-21 NOTE — Progress Notes (Signed)
Advanced Heart Failure Rounding Note   Subjective:    Yesterday she was started on amio drip for A fib and diuresed IV lasix. No weights available. I/O not accurate due to urinary incontinence.   Feeling much better. Denies SOB  Objective:   Weight Range:  Vital Signs:   Temp:  [98.1 F (36.7 C)-99.3 F (37.4 C)] 98.6 F (37 C) (10/03 0315) Pulse Rate:  [99-122] 118 (10/02 1400) Resp:  [17-29] 19 (10/03 0315) BP: (96-155)/(57-137) 122/79 (10/03 0315) SpO2:  [95 %-100 %] 100 % (10/03 0315) Last BM Date: 12/20/15  Weight change: There were no vitals filed for this visit.  Intake/Output:   Intake/Output Summary (Last 24 hours) at 12/21/15 0756 Last data filed at 12/21/15 0500  Gross per 24 hour  Intake            830.2 ml  Output                1 ml  Net            829.2 ml     Physical Exam: General:  Well appearing. No resp difficulty. In bed  HEENT: normal Neck: supple. JVP 5-6  . Carotids 2+ bilat; no bruits. No lymphadenopathy or thryomegaly appreciated. Cor: PMI nondisplaced. Irregular & rhythm. No rubs, gallops or murmurs. Lungs: clear Abdomen: soft, nontender, nondistended. No hepatosplenomegaly. No bruits or masses. Good bowel sounds. Extremities: no cyanosis, clubbing, rash, edema Neuro: alert & orientedx3, cranial nerves grossly intact. moves all 4 extremities w/o difficulty. Affect pleasant  Telemetry: A fib 100s   Labs: Basic Metabolic Panel:  Recent Labs Lab 12/19/15 2239 12/20/15 1115 12/21/15 0349  NA 133*  --  136  K 3.7  --  4.3  CL 94*  --  98*  CO2 28  --  30  GLUCOSE 199*  --  132*  BUN 95*  --  68*  CREATININE 2.05*  --  1.66*  CALCIUM 9.5  --  9.2  MG  --  2.2 2.2    Liver Function Tests:  Recent Labs Lab 12/20/15 1115  AST 19  ALT 13*  ALKPHOS 37*  BILITOT 0.8  PROT 6.7  ALBUMIN 3.3*   No results for input(s): LIPASE, AMYLASE in the last 168 hours. No results for input(s): AMMONIA in the last 168  hours.  CBC:  Recent Labs Lab 12/19/15 2239 12/19/15 2314 12/20/15 1115  WBC 10.9*  --  8.3  NEUTROABS  --  8.9*  --   HGB 9.1*  --  8.4*  HCT 29.5*  --  26.4*  MCV 87.8  --  86.8  PLT 220  --  197    Cardiac Enzymes:  Recent Labs Lab 12/20/15 1115  TROPONINI <0.03    BNP: BNP (last 3 results)  Recent Labs  10/19/15 1129 11/24/15 1222 12/19/15 2320  BNP 1,057.9* 247.2* 800.7*    ProBNP (last 3 results) No results for input(s): PROBNP in the last 8760 hours.    Other results:  Imaging: Dg Chest Port 1 View  Result Date: 12/20/2015 CLINICAL DATA:  Two falls today. Dizziness. Fever and nonproductive cough. EXAM: PORTABLE CHEST 1 VIEW COMPARISON:  12/01/2015 FINDINGS: Generator pack with stimulator leads extending into the left jugular region. Shallow inspiration. Heart size and pulmonary vascularity are normal for technique. Vascular crowding. No focal consolidation or airspace disease. No blunting of costophrenic angles. No pneumothorax. Calcification of the aorta. IMPRESSION: Shallow inspiration with vascular crowding. No evidence of  active pulmonary disease. Electronically Signed   By: Lucienne Capers M.D.   On: 12/20/2015 00:16      Medications:     Scheduled Medications: . aspirin EC  81 mg Oral Daily  . atorvastatin  20 mg Oral q1800  . carbidopa-levodopa  1 tablet Oral TID  . cefTRIAXone (ROCEPHIN)  IV  1 g Intravenous Q24H  . Chlorhexidine Gluconate Cloth  6 each Topical Q0600  . citalopram  20 mg Oral Daily  . furosemide  40 mg Intravenous BID  . gabapentin  100 mg Oral TID  . insulin aspart  0-5 Units Subcutaneous QHS  . insulin aspart  0-9 Units Subcutaneous TID WC  . mupirocin ointment  1 application Nasal BID  . potassium chloride  40 mEq Oral Daily  . Rivaroxaban  15 mg Oral Q supper  . rOPINIRole  0.25 mg Oral TID     Infusions: . amiodarone 30 mg/hr (12/21/15 0610)     PRN Medications:  acetaminophen **OR** acetaminophen,  meclizine, promethazine, sodium chloride, traZODone, white petrolatum   Assessment:  1. Chest Pain 2. Vertigo 3. UTI 4. A Fib RVR --This patients CHA2DS2-VASc = 6 5. Acute/Chronic diastolic CHF - Echo [id number] LVEF 55-60% 6. Chronic respiratory failure 7. CKD stage III- Creatinine baseline 1.9-2.2 8. T2DM with neuropathy 9. HTN 10  HLD 11. Anxiety 12. Parkinsons with falls and weakness   Plan/Discussion:   On exam volume status improved. Renal function improved. Continue IV lasix. for now.   Remains in A fib RVR. Continue amiodarone drip. Has been on xarelto. Set up DC-CV for tomorrow.    UTI being treated with rocephin.   Consult cardiac rehab.     Length of Stay: 1   Amy Clegg NP-C  12/21/2015, 7:56 AM  Advanced Heart Failure Team Pager [phone] (M-F; 7a - 4p)  Please contact [facility] for night-coverage after hours (4p -7a ) and weekends on [email or web address]  Patient seen and examined with Darrick Grinder, NP. We discussed all aspects of the encounter. I agree with the assessment and plan as stated above.   Volume status improving. Feels better. Continue IV lasix. Remains in AF with RVR. Continue amio and Xarelto. DC-CV tomorrow.   Zailah Zagami,MD 11:26 AM

## 2015-12-21 NOTE — Progress Notes (Signed)
PROGRESS NOTE    Melissa Powers  [id number] DOB: May 08, 1945 DOA: 12/19/2015 PCP: Maris Berger, MD      Brief Narrative:  Melissa Powers is a 70 y.o. female with a past medical history significant for HFpEF, Afib on Xarelto, CKD Stage III, IDDM, HTN, anemia, and Parkinson's disease who presents with 1 week worsening malaise and 1 day vertigo, falling, chest pain. The patient first started feeling worse than usual about a week ago, with worsening intermittent tachycardia and vertigo and vague malaise.  Then, over the last 24 hours, she has noticed fever, shortness of breath, intermittent chest pain, foul-smelling urine, urinary incontinence, and finally severe vertigo/spinning (this had been present for >2 weeks) and so she fell twice and then came to the ER. Patient was admitted for sepsis secondary to presumed UTI, atrial fibrillation.   Assessment & Plan:   Principal Problem:   Sepsis, unspecified organism [facility]) Active Problems:   Essential hypertension   Parkinson's disease ([place])   Chronic diastolic CHF (congestive heart failure) (HCC)   CKD (chronic kidney disease) stage 4, GFR 15-29 ml/min (HCC)   Diabetes mellitus type 2, controlled (HCC)   Positional vertigo   UTI (urinary tract infection)   Paroxysmal atrial fibrillation (HCC)   Atrial fibrillation with RVR (HCC)  Sepsis secondary to UTI, POA  -Urine culture - contaminant  -Blood cultures - pending  -IVF bolus in ED  -Ceftriaxone   Atrial fibrillation with RVR -CHADS2Vasc 5 -Given diltiazem gtt in ED. Stopped prior to admission since HR better controlled and BP low  -Advanced heart failure team following, appreciate recs  -Amio gtt  -Continue Xarelto -Tele -Planning for DC-CV tomorrow   Chronic diastolic CHF -EF [id number] in August -Consult to Cardiology, appreciate recommendations -Lasix -I's and O's, daily weight  Parkinson's disease -Continue Sinemet -Continue Requip  IDDM -No longer on Januvia or  Invokana -SSI with ac/hs  -Continue Neurontin -Check a1c   HTN -Hypotensive at admission because of atrial fibrillation, diastolic dysfunction. -Hold valsartan, hydralazine and amlodipine until hemodynamics clearer -Continue metoprolol  CAD -Asa  -Lipitor  CKD stage 4 -Baseline Cr 2-2.5 -Stable, monitor   Depression -Celexa  Vertigo -Start antivert    DVT prophylaxis: xarelto  Code Status: FULL  Family Communication: husband at bedside Disposition Plan: continue diuresis, antibiotics.   Consultants:   Cardiology   Procedures:   None  Antimicrobials:   Ceftriaxone 10/2 >>    Subjective: She states that she feels better today than she did when she came into the hospital. She denies any chest pressure. Her shortness of breath is better. She remains apprehensive regarding DC CV plan for tomorrow, but all questions were answered to the best of my abilities. Complaints of dizziness but denies that this is "inner ear" vertigo as she has had that before in the symptoms are different.  Objective: Vitals:   12/21/15 0000 12/21/15 0315 12/21/15 0811 12/21/15 1322  BP: 113/69 122/79 119/69 (!) 143/81  Pulse:   (!) 127   Resp: 20 19 (!) 21 (!) 27  Temp: 99.3 F (37.4 C) 98.6 F (37 C) 98.9 F (37.2 C) 98.7 F (37.1 C)  TempSrc: Oral Oral Oral Oral  SpO2: 97% 100% 98% 97%  Weight:   88.3 kg (194 lb 11.2 oz)   Height:   5\' 3"  (1.6 m)     Intake/Output Summary (Last 24 hours) at 12/21/15 1432 Last data filed at 12/21/15 1400  Gross per 24 hour  Intake  1033.9 ml  Output                1 ml  Net           1032.9 ml   Filed Weights   12/21/15 0811  Weight: 88.3 kg (194 lb 11.2 oz)    Examination:  General exam: Appears calm and comfortable  Respiratory system: Clear to auscultation. Respiratory effort normal. Cardiovascular system: S1 & S2 heard, tachycardic, irreg. No pedal edema. Gastrointestinal system: Abdomen is nondistended, soft and  nontender. No organomegaly or masses felt. Normal bowel sounds heard. Central nervous system: Alert and oriented. No focal neurological deficits. Extremities: Symmetric 5 x 5 power. Skin: No rashes, lesions or ulcers Psychiatry: Judgement and insight appear normal. Mood & affect appropriate.   Data Reviewed: I have personally reviewed following labs and imaging studies  CBC:  Recent Labs Lab 12/19/15 2239 12/19/15 2314 12/20/15 1115  WBC 10.9*  --  8.3  NEUTROABS  --  8.9*  --   HGB 9.1*  --  8.4*  HCT 29.5*  --  26.4*  MCV 87.8  --  86.8  PLT 220  --  [id number]   Basic Metabolic Panel:  Recent Labs Lab 12/19/15 2239 12/20/15 1115 12/21/15 0349  NA 133*  --  136  K 3.7  --  4.3  CL 94*  --  98*  CO2 28  --  30  GLUCOSE 199*  --  132*  BUN 95*  --  68*  CREATININE 2.05*  --  1.66*  CALCIUM 9.5  --  9.2  MG  --  2.2 2.2   GFR: Estimated Creatinine Clearance: 33.3 mL/min (by C-G formula based on SCr of 1.66 mg/dL (H)). Liver Function Tests:  Recent Labs Lab 12/20/15 1115  AST 19  ALT 13*  ALKPHOS 37*  BILITOT 0.8  PROT 6.7  ALBUMIN 3.3*   No results for input(s): LIPASE, AMYLASE in the last 168 hours. No results for input(s): AMMONIA in the last 168 hours. Coagulation Profile: No results for input(s): INR, PROTIME in the last 168 hours. Cardiac Enzymes:  Recent Labs Lab 12/20/15 1115  TROPONINI <0.03   BNP (last 3 results) No results for input(s): PROBNP in the last 8760 hours. HbA1C: No results for input(s): HGBA1C in the last 72 hours. CBG:  Recent Labs Lab 12/20/15 1227 12/20/15 1759 12/20/15 2157 12/21/15 0808 12/21/15 1319  GLUCAP 265* 167* 164* 174* 184*   Lipid Profile: No results for input(s): CHOL, HDL, LDLCALC, TRIG, CHOLHDL, LDLDIRECT in the last 72 hours. Thyroid Function Tests:  Recent Labs  12/20/15 1115  TSH 1.575   Anemia Panel: No results for input(s): VITAMINB12, FOLATE, FERRITIN, TIBC, IRON, RETICCTPCT in the last 72  hours. Sepsis Labs:  Recent Labs Lab 12/20/15 0016  LATICACIDVEN 0.76    Recent Results (from the past 240 hour(s))  Urine culture     Status: Abnormal   Collection Time: 12/19/15  2:20 AM  Result Value Ref Range Status   Specimen Description URINE, CLEAN CATCH  Final   Special Requests NONE  Final   Culture MULTIPLE SPECIES PRESENT, SUGGEST RECOLLECTION (A)  Final   Report Status 12/21/2015 FINAL  Final  MRSA PCR Screening     Status: Abnormal   Collection Time: 12/20/15 12:40 PM  Result Value Ref Range Status   MRSA by PCR POSITIVE (A) NEGATIVE Final    Comment:        The GeneXpert MRSA Assay (FDA approved for  NASAL specimens only), is one component of a comprehensive MRSA colonization surveillance program. It is not intended to diagnose MRSA infection nor to guide or monitor treatment for MRSA infections. RESULT CALLED TO, READ BACK BY AND VERIFIED WITH: Pia Mau RN 14:30 12/20/15 ([email or web address])          Radiology Studies: Dg Chest Port 1 View  Result Date: 12/20/2015 CLINICAL DATA:  Two falls today. Dizziness. Fever and nonproductive cough. EXAM: PORTABLE CHEST 1 VIEW COMPARISON:  12/01/2015 FINDINGS: Generator pack with stimulator leads extending into the left jugular region. Shallow inspiration. Heart size and pulmonary vascularity are normal for technique. Vascular crowding. No focal consolidation or airspace disease. No blunting of costophrenic angles. No pneumothorax. Calcification of the aorta. IMPRESSION: Shallow inspiration with vascular crowding. No evidence of active pulmonary disease. Electronically Signed   By: Lucienne Capers M.D.   On: 12/20/2015 00:16        Scheduled Meds: . aspirin EC  81 mg Oral Daily  . atorvastatin  20 mg Oral q1800  . carbidopa-levodopa  1 tablet Oral TID  . cefTRIAXone (ROCEPHIN)  IV  1 g Intravenous Q24H  . Chlorhexidine Gluconate Cloth  6 each Topical Q0600  . citalopram  20 mg Oral Daily  . furosemide  40 mg  Intravenous BID  . gabapentin  100 mg Oral TID  . insulin aspart  0-5 Units Subcutaneous QHS  . insulin aspart  0-9 Units Subcutaneous TID WC  . mupirocin ointment  1 application Nasal BID  . potassium chloride  40 mEq Oral Daily  . Rivaroxaban  15 mg Oral Q supper  . rOPINIRole  0.25 mg Oral TID  . sodium chloride flush  3 mL Intravenous Q12H   Continuous Infusions: . sodium chloride    . amiodarone 30 mg/hr (12/21/15 0610)     LOS: 1 day    Time spent: 10minutes    Dessa Phi, DO [facility] Pager [phone]  If 7PM-7AM, please contact night-coverage [email or web address] Password [email or web address] 12/21/2015, 2:32 PM

## 2015-12-22 ENCOUNTER — Encounter (HOSPITAL_COMMUNITY)
Admission: EM | Disposition: A | Discharge status: Home / Self Care | Payer: Self-pay | Source: Home / Self Care | Attending: Internal Medicine

## 2015-12-22 ENCOUNTER — Encounter: Payer: Self-pay | Admitting: *Deleted

## 2015-12-22 ENCOUNTER — Encounter (HOSPITAL_COMMUNITY): Payer: Self-pay

## 2015-12-22 ENCOUNTER — Inpatient Hospital Stay (HOSPITAL_COMMUNITY): Payer: Medicare (Managed Care) | Admitting: Anesthesiology

## 2015-12-22 ENCOUNTER — Inpatient Hospital Stay (HOSPITAL_COMMUNITY): Admission: RE | Admit: 2015-12-22 | Payer: No Typology Code available for payment source | Source: Ambulatory Visit

## 2015-12-22 HISTORY — PX: CARDIOVERSION: SHX1299

## 2015-12-22 LAB — BASIC METABOLIC PANEL
ANION GAP: 13 (ref 5–15)
BUN: 57 mg/dL — ABNORMAL HIGH (ref 6–20)
CHLORIDE: 93 mmol/L — AB (ref 101–111)
CO2: 30 mmol/L (ref 22–32)
Calcium: 9.4 mg/dL (ref 8.9–10.3)
Creatinine, Ser: 1.71 mg/dL — ABNORMAL HIGH (ref 0.44–1.00)
GFR calc non Af Amer: 29 mL/min — ABNORMAL LOW (ref >60)
GFR, EST AFRICAN AMERICAN: 34 mL/min — AB (ref >60)
GLUCOSE: 133 mg/dL — AB (ref 65–99)
Potassium: 4.5 mmol/L (ref 3.5–5.1)
Sodium: 136 mmol/L (ref 135–145)

## 2015-12-22 LAB — GLUCOSE, CAPILLARY
GLUCOSE-CAPILLARY: 164 mg/dL — AB (ref 65–99)
GLUCOSE-CAPILLARY: 198 mg/dL — AB (ref 65–99)
GLUCOSE-CAPILLARY: 162 mg/dL — AB (ref 65–99)
Glucose-Capillary: 178 mg/dL — ABNORMAL HIGH (ref 65–99)

## 2015-12-22 SURGERY — CARDIOVERSION
Anesthesia: Monitor Anesthesia Care

## 2015-12-22 MED ORDER — LIDOCAINE 2% (20 MG/ML) 5 ML SYRINGE
INTRAMUSCULAR | Status: DC | PRN
  Administered 2015-12-22: 40 mg via INTRAVENOUS

## 2015-12-22 MED ORDER — PROPOFOL 10 MG/ML IV BOLUS
INTRAVENOUS | Status: DC | PRN
  Administered 2015-12-22: 50 mg via INTRAVENOUS

## 2015-12-22 NOTE — Anesthesia Procedure Notes (Signed)
Date/Time: 12/22/2015 1:51 PM Performed by: Merrilyn Puma B Pre-anesthesia Checklist: Patient identified, Emergency Drugs available, Timeout performed and Patient being monitored Oxygen Delivery Method: Ambu bag Preoxygenation: Pre-oxygenation with 100% oxygen Intubation Type: IV induction Ventilation: Mask ventilation without difficulty Placement Confirmation: positive ETCO2 and breath sounds checked- equal and bilateral Dental Injury: Teeth and Oropharynx as per pre-operative assessment

## 2015-12-22 NOTE — Transfer of Care (Signed)
Immediate Anesthesia Transfer of Care Note  Patient: Melissa Powers  Procedure(s) Performed: Procedure(s): CARDIOVERSION (N/A)  Patient Location: Endoscopy Unit  Anesthesia Type:MAC  Level of Consciousness: awake, alert  and oriented  Airway & Oxygen Therapy: Patient Spontanous Breathing  Post-op Assessment: Report given to RN, Post -op Vital signs reviewed and stable and Patient moving all extremities X 4  Post vital signs: Reviewed and stable  Last Vitals:  Vitals:   12/22/15 0400 12/22/15 1250  BP: 100/69 (!) 141/83  Pulse: 96 (!) 128  Resp: (!) 24 20  Temp: 36.8 C     Last Pain:  Vitals:   12/22/15 1157  TempSrc:   PainSc: 0-No pain      Patients Stated Pain Goal: 0 ([id number])  Complications: No apparent anesthesia complications

## 2015-12-22 NOTE — H&P (View-Only) (Signed)
Advanced Heart Failure Rounding Note   Subjective:    Yesterday she was started on amio drip for A fib and diuresed IV lasix. No weights available. I/O not accurate due to urinary incontinence.   Feeling much better. Denies SOB  Objective:   Weight Range:  Vital Signs:   Temp:  [98.1 F (36.7 C)-99.3 F (37.4 C)] 98.6 F (37 C) (10/03 0315) Pulse Rate:  [99-122] 118 (10/02 1400) Resp:  [17-29] 19 (10/03 0315) BP: (96-155)/(57-137) 122/79 (10/03 0315) SpO2:  [95 %-100 %] 100 % (10/03 0315) Last BM Date: 12/20/15  Weight change: There were no vitals filed for this visit.  Intake/Output:   Intake/Output Summary (Last 24 hours) at 12/21/15 0756 Last data filed at 12/21/15 0500  Gross per 24 hour  Intake            830.2 ml  Output                1 ml  Net            829.2 ml     Physical Exam: General:  Well appearing. No resp difficulty. In bed  HEENT: normal Neck: supple. JVP 5-6  . Carotids 2+ bilat; no bruits. No lymphadenopathy or thryomegaly appreciated. Cor: PMI nondisplaced. Irregular & rhythm. No rubs, gallops or murmurs. Lungs: clear Abdomen: soft, nontender, nondistended. No hepatosplenomegaly. No bruits or masses. Good bowel sounds. Extremities: no cyanosis, clubbing, rash, edema Neuro: alert & orientedx3, cranial nerves grossly intact. moves all 4 extremities w/o difficulty. Affect pleasant  Telemetry: A fib 100s   Labs: Basic Metabolic Panel:  Recent Labs Lab 12/19/15 2239 12/20/15 1115 12/21/15 0349  NA 133*  --  136  K 3.7  --  4.3  CL 94*  --  98*  CO2 28  --  30  GLUCOSE 199*  --  132*  BUN 95*  --  68*  CREATININE 2.05*  --  1.66*  CALCIUM 9.5  --  9.2  MG  --  2.2 2.2    Liver Function Tests:  Recent Labs Lab 12/20/15 1115  AST 19  ALT 13*  ALKPHOS 37*  BILITOT 0.8  PROT 6.7  ALBUMIN 3.3*   No results for input(s): LIPASE, AMYLASE in the last 168 hours. No results for input(s): AMMONIA in the last 168  hours.  CBC:  Recent Labs Lab 12/19/15 2239 12/19/15 2314 12/20/15 1115  WBC 10.9*  --  8.3  NEUTROABS  --  8.9*  --   HGB 9.1*  --  8.4*  HCT 29.5*  --  26.4*  MCV 87.8  --  86.8  PLT 220  --  197    Cardiac Enzymes:  Recent Labs Lab 12/20/15 1115  TROPONINI <0.03    BNP: BNP (last 3 results)  Recent Labs  10/19/15 1129 11/24/15 1222 12/19/15 2320  BNP 1,057.9* 247.2* 800.7*    ProBNP (last 3 results) No results for input(s): PROBNP in the last 8760 hours.    Other results:  Imaging: Dg Chest Port 1 View  Result Date: 12/20/2015 CLINICAL DATA:  Two falls today. Dizziness. Fever and nonproductive cough. EXAM: PORTABLE CHEST 1 VIEW COMPARISON:  12/01/2015 FINDINGS: Generator pack with stimulator leads extending into the left jugular region. Shallow inspiration. Heart size and pulmonary vascularity are normal for technique. Vascular crowding. No focal consolidation or airspace disease. No blunting of costophrenic angles. No pneumothorax. Calcification of the aorta. IMPRESSION: Shallow inspiration with vascular crowding. No evidence of  active pulmonary disease. Electronically Signed   By: Lucienne Capers M.D.   On: 12/20/2015 00:16      Medications:     Scheduled Medications: . aspirin EC  81 mg Oral Daily  . atorvastatin  20 mg Oral q1800  . carbidopa-levodopa  1 tablet Oral TID  . cefTRIAXone (ROCEPHIN)  IV  1 g Intravenous Q24H  . Chlorhexidine Gluconate Cloth  6 each Topical Q0600  . citalopram  20 mg Oral Daily  . furosemide  40 mg Intravenous BID  . gabapentin  100 mg Oral TID  . insulin aspart  0-5 Units Subcutaneous QHS  . insulin aspart  0-9 Units Subcutaneous TID WC  . mupirocin ointment  1 application Nasal BID  . potassium chloride  40 mEq Oral Daily  . Rivaroxaban  15 mg Oral Q supper  . rOPINIRole  0.25 mg Oral TID     Infusions: . amiodarone 30 mg/hr (12/21/15 0610)     PRN Medications:  acetaminophen **OR** acetaminophen,  meclizine, promethazine, sodium chloride, traZODone, white petrolatum   Assessment:  1. Chest Pain 2. Vertigo 3. UTI 4. A Fib RVR --This patients CHA2DS2-VASc = 6 5. Acute/Chronic diastolic CHF - Echo [id number] LVEF 55-60% 6. Chronic respiratory failure 7. CKD stage III- Creatinine baseline 1.9-2.2 8. T2DM with neuropathy 9. HTN 10  HLD 11. Anxiety 12. Parkinsons with falls and weakness   Plan/Discussion:   On exam volume status improved. Renal function improved. Continue IV lasix. for now.   Remains in A fib RVR. Continue amiodarone drip. Has been on xarelto. Set up DC-CV for tomorrow.    UTI being treated with rocephin.   Consult cardiac rehab.     Length of Stay: 1   Amy Clegg NP-C  12/21/2015, 7:56 AM  Advanced Heart Failure Team Pager [phone] (M-F; 7a - 4p)  Please contact [facility] for night-coverage after hours (4p -7a ) and weekends on [email or web address]  Patient seen and examined with Darrick Grinder, NP. We discussed all aspects of the encounter. I agree with the assessment and plan as stated above.   Volume status improving. Feels better. Continue IV lasix. Remains in AF with RVR. Continue amio and Xarelto. DC-CV tomorrow.   Bensimhon, Daniel,MD 11:26 AM

## 2015-12-22 NOTE — Progress Notes (Signed)
PROGRESS NOTE    [name]  [id number] DOB: 01-17-1946 DOA: 12/19/2015 PCP: Maris Berger, MD   Chief Complaint  Patient presents with  . Fall  . Fever  . Cough  . Dizziness     Brief Narrative:  HPI on 12/20/2015 by Dr. Myrene Buddy [name] is a 70 y.o. female with a past medical history significant for HFpEF, Afib on Xarelto, CKD Stage III, IDDM, HTN, anemia, and Parkinson's disease who presents with 1 week worsening malaise and 1 day vertigo, falling, chest pain. The patient first started feeling worse than usual about a week ago, with worsening intermittent tachycardia and vertigo and vague malaise.  Then, over the last 24 hours, she has noticed fever, shortness of breath, intermittent chest pain, foul-smelling urine, urinary incontinence, and finally severe vertigo/spinning (this had been present for >2 weeks) and so she fell twice and then came to the ER.  Assessment & Plan   Sepsis secondary to UTI, POA  -Upon admission, patient was tachycardic, tachypneic with soft BP -Urine culture showed multiple species -Blood cultures show no growth to date -Was give IVF  -Continue ceftriaxone  Atrial fibrillation with RVR -CHADS2Vasc 6 -Given diltiazem gtt in ED. Stopped prior to admission since HR better controlled and BP low  -Cardiology consulted and appreciated, followed by heart failure team -Continue amiodarone gtt, xarelto -Plan for DCCV today  Acute on Chronic diastolic CHF -Echocardiogram 10/20/2015 showed EF 55-60% -Cardiology consulted and appreciated -Continue lasix -monitor intake/output, daily weights -improving  Parkinson's disease -Continue Sinemet, requip  Diabetes mellitus, type 2 with neuropathy -No longer on Januvia or Invokana -Continue ISS and CBG monitoring  -Continue Neurontin -Hemoglobin A2c 6.3 (10/22/2015)  Essential hypertension -Hypotensive at admission because of atrial fibrillation, diastolic dysfunction. -Hold  valsartan, hydralazine and amlodipine until hemodynamics clearer -Continue metoprolol  CAD -Currently chest pain free, continue aspirin and statin  CKD stage 4 -Baseline Cr 2-2.5 -Creatinine currently 1.7 -Continue to monitor BMP  Depression -Continue celexa  Vertigo -Continue antivert   DVT Prophylaxis  Xarelto  Code Status: Full  Family Communication: Husband at bedside  Disposition Plan: Admitted. Pending DCCV today.   Consultants Cardiology   Procedures  None  Antibiotics   Anti-infectives    Start     Dose/Rate Route Frequency Ordered Stop   12/20/15 2200  cefTRIAXone (ROCEPHIN) 1 g in dextrose 5 % 50 mL IVPB     1 g 100 mL/hr over 30 Minutes Intravenous Every 24 hours 12/20/15 0643     12/20/15 0030  cefTRIAXone (ROCEPHIN) 1 g in dextrose 5 % 50 mL IVPB     1 g 100 mL/hr over 30 Minutes Intravenous  Once 12/20/15 0020 12/20/15 0256      Subjective:   Melissa Powers seen and examined today.  Patient states she is anxious regarding her upcoming procedure.  She is trying to turn her brain stimulator off.  Denies chest pain or shortness of breath at this time. Does states she is hungry and wants some coffee.    Objective:   Vitals:   12/21/15 1642 12/21/15 1935 12/21/15 2339 12/22/15 0400  BP: 137/84 128/61 (!) 141/70 100/69  Pulse: (!) 115 (!) 117 (!) 111 96  Resp: (!) 25 (!) 24 19 (!) 24  Temp: 98.5 F (36.9 C) 98.4 F (36.9 C) 98.4 F (36.9 C) 98.3 F (36.8 C)  TempSrc: Oral Oral Oral Oral  SpO2: 96% 100% 96%   Weight:    87.5 kg (192 lb  14.4 oz)  Height:        Intake/Output Summary (Last 24 hours) at 12/22/15 1024 Last data filed at 12/22/15 0740  Gross per 24 hour  Intake            964.2 ml  Output              300 ml  Net            664.2 ml   Filed Weights   12/21/15 0811 12/22/15 0400  Weight: 88.3 kg (194 lb 11.2 oz) 87.5 kg (192 lb 14.4 oz)    Exam  General: Well developed, well nourished, NAD, appears stated age  107:  NCAT, mucous membranes moist.   Neck: Supple,+ JVD, no masses  Cardiovascular: S1 S2 auscultated, irregular, no murmurs  Respiratory: Clear to auscultation bilaterally with equal chest rise  Abdomen: Soft, nontender, nondistended, + bowel sounds  Extremities: warm dry without cyanosis clubbing or edema  Neuro: AAOx3, nonfocal  Psych: Normal affect and demeanor with intact judgement and insight   Data Reviewed: I have personally reviewed following labs and imaging studies  CBC:  Recent Labs Lab 12/19/15 2239 12/19/15 2314 12/20/15 1115  WBC 10.9*  --  8.3  NEUTROABS  --  8.9*  --   HGB 9.1*  --  8.4*  HCT 29.5*  --  26.4*  MCV 87.8  --  86.8  PLT 220  --  [id number]   Basic Metabolic Panel:  Recent Labs Lab 12/19/15 2239 12/20/15 1115 12/21/15 0349 12/22/15 0227  NA 133*  --  136 136  K 3.7  --  4.3 4.5  CL 94*  --  98* 93*  CO2 28  --  30 30  GLUCOSE 199*  --  132* 133*  BUN 95*  --  68* 57*  CREATININE 2.05*  --  1.66* 1.71*  CALCIUM 9.5  --  9.2 9.4  MG  --  2.2 2.2  --    GFR: Estimated Creatinine Clearance: 32.1 mL/min (by C-G formula based on SCr of 1.71 mg/dL (H)). Liver Function Tests:  Recent Labs Lab 12/20/15 1115  AST 19  ALT 13*  ALKPHOS 37*  BILITOT 0.8  PROT 6.7  ALBUMIN 3.3*   No results for input(s): LIPASE, AMYLASE in the last 168 hours. No results for input(s): AMMONIA in the last 168 hours. Coagulation Profile: No results for input(s): INR, PROTIME in the last 168 hours. Cardiac Enzymes:  Recent Labs Lab 12/20/15 1115  TROPONINI <0.03   BNP (last 3 results) No results for input(s): PROBNP in the last 8760 hours. HbA1C: No results for input(s): HGBA1C in the last 72 hours. CBG:  Recent Labs Lab 12/21/15 0808 12/21/15 1319 12/21/15 1635 12/21/15 2216 12/22/15 0851  GLUCAP 174* 184* 179* 177* 178*   Lipid Profile: No results for input(s): CHOL, HDL, LDLCALC, TRIG, CHOLHDL, LDLDIRECT in the last 72 hours. Thyroid  Function Tests:  Recent Labs  12/20/15 1115  TSH 1.575   Anemia Panel: No results for input(s): VITAMINB12, FOLATE, FERRITIN, TIBC, IRON, RETICCTPCT in the last 72 hours. Urine analysis:    Component Value Date/Time   COLORURINE YELLOW 12/19/2015 0220   APPEARANCEUR CLOUDY (A) 12/19/2015 0220   LABSPEC 1.010 12/19/2015 0220   PHURINE 5.5 12/19/2015 0220   GLUCOSEU NEGATIVE 12/19/2015 0220   HGBUR NEGATIVE 12/19/2015 0220   BILIRUBINUR NEGATIVE 12/19/2015 0220   KETONESUR NEGATIVE 12/19/2015 0220   PROTEINUR NEGATIVE 12/19/2015 0220   NITRITE POSITIVE (A) 12/19/2015  [place] 12/19/2015 0220   Sepsis Labs: @LABRCNTIP (procalcitonin:4,lacticidven:4)  ) Recent Results (from the past 240 hour(s))  Urine culture     Status: Abnormal   Collection Time: 12/19/15  2:20 AM  Result Value Ref Range Status   Specimen Description URINE, CLEAN CATCH  Final   Special Requests NONE  Final   Culture MULTIPLE SPECIES PRESENT, SUGGEST RECOLLECTION (A)  Final   Report Status 12/21/2015 FINAL  Final  Blood Culture (routine x 2)     Status: None (Preliminary result)   Collection Time: 12/19/15 11:56 PM  Result Value Ref Range Status   Specimen Description BLOOD RIGHT HAND  Final   Special Requests BOTTLES DRAWN AEROBIC AND ANAEROBIC 5CC EA  Final   Culture NO GROWTH 1 DAY  Final   Report Status PENDING  Incomplete  Blood Culture (routine x 2)     Status: None (Preliminary result)   Collection Time: 12/20/15 12:05 AM  Result Value Ref Range Status   Specimen Description BLOOD LEFT HAND  Final   Special Requests BOTTLES DRAWN AEROBIC AND ANAEROBIC 5CC EA  Final   Culture NO GROWTH 1 DAY  Final   Report Status PENDING  Incomplete  MRSA PCR Screening     Status: Abnormal   Collection Time: 12/20/15 12:40 PM  Result Value Ref Range Status   MRSA by PCR POSITIVE (A) NEGATIVE Final    Comment:        The GeneXpert MRSA Assay (FDA approved for NASAL specimens only), is  one component of a comprehensive MRSA colonization surveillance program. It is not intended to diagnose MRSA infection nor to guide or monitor treatment for MRSA infections. RESULT CALLED TO, READ BACK BY AND VERIFIED WITH: Pia Mau RN 14:30 12/20/15 ([email or web address])       Radiology Studies: No results found.   Scheduled Meds: . aspirin EC  81 mg Oral Daily  . atorvastatin  20 mg Oral q1800  . carbidopa-levodopa  1 tablet Oral TID  . cefTRIAXone (ROCEPHIN)  IV  1 g Intravenous Q24H  . Chlorhexidine Gluconate Cloth  6 each Topical Q0600  . citalopram  20 mg Oral Daily  . furosemide  40 mg Intravenous BID  . gabapentin  100 mg Oral TID  . insulin aspart  0-5 Units Subcutaneous QHS  . insulin aspart  0-9 Units Subcutaneous TID WC  . mupirocin ointment  1 application Nasal BID  . potassium chloride  40 mEq Oral Daily  . Rivaroxaban  15 mg Oral Q supper  . rOPINIRole  0.25 mg Oral TID  . sodium chloride flush  3 mL Intravenous Q12H   Continuous Infusions: . sodium chloride    . amiodarone 30 mg/hr (12/22/15 0524)     LOS: 2 days   Time Spent in minutes   30 minutes  Whitten Andreoni D.O. on 12/22/2015 at 10:24 AM  Between 7am to 7pm - Pager - [phone]  After 7pm go to [email or web address] - password [email or web address]  And look for the night coverage person covering for me after hours  [facility] Office  [phone]

## 2015-12-22 NOTE — Progress Notes (Signed)
Advanced Heart Failure Rounding Note   Subjective:    Remains in A fib RVR with Amio drip. Diuresing with IV lasix. Weight down another 2 pounds.   Feeling much better. Dizzy when standing.   Objective:   Weight Range:  Vital Signs:   Temp:  [98.3 F (36.8 C)-98.7 F (37.1 C)] 98.3 F (36.8 C) (10/04 0400) Pulse Rate:  [96-117] 96 (10/04 0400) Resp:  [19-27] 24 (10/04 0400) BP: (100-143)/(61-84) 100/69 (10/04 0400) SpO2:  [96 %-100 %] 96 % (10/03 2339) Weight:  [192 lb 14.4 oz (87.5 kg)] 192 lb 14.4 oz (87.5 kg) (10/04 0400) Last BM Date: 12/20/15  Weight change: Filed Weights   12/21/15 0811 12/22/15 0400  Weight: 194 lb 11.2 oz (88.3 kg) 192 lb 14.4 oz (87.5 kg)    Intake/Output:   Intake/Output Summary (Last 24 hours) at 12/22/15 1213 Last data filed at 12/22/15 0740  Gross per 24 hour  Intake            964.2 ml  Output              300 ml  Net            664.2 ml     Physical Exam: General:  Well appearing. No resp difficulty. In bed  HEENT: normal Neck: supple. JVP 5-6  . Carotids 2+ bilat; no bruits. No lymphadenopathy or thryomegaly appreciated. Cor: PMI nondisplaced. Irregular & rhythm. No rubs, gallops or murmurs. Lungs: clear Abdomen: soft, nontender, nondistended. No hepatosplenomegaly. No bruits or masses. Good bowel sounds. Extremities: no cyanosis, clubbing, rash, edema Neuro: alert & orientedx3, cranial nerves grossly intact. moves all 4 extremities w/o difficulty. Affect pleasant  Telemetry: A fib 100-130s    Labs: Basic Metabolic Panel:  Recent Labs Lab 12/19/15 2239 12/20/15 1115 12/21/15 0349 12/22/15 0227  NA 133*  --  136 136  K 3.7  --  4.3 4.5  CL 94*  --  98* 93*  CO2 28  --  30 30  GLUCOSE 199*  --  132* 133*  BUN 95*  --  68* 57*  CREATININE 2.05*  --  1.66* 1.71*  CALCIUM 9.5  --  9.2 9.4  MG  --  2.2 2.2  --     Liver Function Tests:  Recent Labs Lab 12/20/15 1115  AST 19  ALT 13*  ALKPHOS 37*  BILITOT  0.8  PROT 6.7  ALBUMIN 3.3*   No results for input(s): LIPASE, AMYLASE in the last 168 hours. No results for input(s): AMMONIA in the last 168 hours.  CBC:  Recent Labs Lab 12/19/15 2239 12/19/15 2314 12/20/15 1115  WBC 10.9*  --  8.3  NEUTROABS  --  8.9*  --   HGB 9.1*  --  8.4*  HCT 29.5*  --  26.4*  MCV 87.8  --  86.8  PLT 220  --  197    Cardiac Enzymes:  Recent Labs Lab 12/20/15 1115  TROPONINI <0.03    BNP: BNP (last 3 results)  Recent Labs  10/19/15 1129 11/24/15 1222 12/19/15 2320  BNP 1,057.9* 247.2* 800.7*    ProBNP (last 3 results) No results for input(s): PROBNP in the last 8760 hours.    Other results:  Imaging: No results found.   Medications:     Scheduled Medications: . aspirin EC  81 mg Oral Daily  . atorvastatin  20 mg Oral q1800  . carbidopa-levodopa  1 tablet Oral TID  . cefTRIAXone (ROCEPHIN)  IV  1 g Intravenous Q24H  . Chlorhexidine Gluconate Cloth  6 each Topical Q0600  . citalopram  20 mg Oral Daily  . furosemide  40 mg Intravenous BID  . gabapentin  100 mg Oral TID  . insulin aspart  0-5 Units Subcutaneous QHS  . insulin aspart  0-9 Units Subcutaneous TID WC  . mupirocin ointment  1 application Nasal BID  . potassium chloride  40 mEq Oral Daily  . Rivaroxaban  15 mg Oral Q supper  . rOPINIRole  0.25 mg Oral TID  . sodium chloride flush  3 mL Intravenous Q12H    Infusions: . sodium chloride    . amiodarone 30 mg/hr (12/22/15 0524)    PRN Medications: acetaminophen **OR** acetaminophen, meclizine, promethazine, sodium chloride, sodium chloride flush, traZODone, white petrolatum   Assessment:  1. Chest Pain 2. Vertigo 3. UTI 4. A Fib RVR --This patients CHA2DS2-VASc = 6 5. Acute/Chronic diastolic CHF - Echo 123456 LVEF 55-60% 6. Chronic respiratory failure 7. CKD stage III- Creatinine baseline 1.9-2.2 8. T2DM with neuropathy 9. HTN 10  HLD 11. Anxiety 12. Parkinsons with falls and  weakness   Plan/Discussion:   On exam volume status improved. Renal function ok. Stop IV lasix     Remains in A fib RVR. Continue amiodarone drip. Has been on xarelto. DC-CV today.     UTI being treated with rocephin.    Length of Stay: 2   Amy Clegg NP-C  12/22/2015, 12:13 PM  Advanced Heart Failure Team Pager [phone] (M-F; 7a - 4p)  Please contact [facility] for night-coverage after hours (4p -7a ) and weekends on [email or web address]  Patient seen and examined with Darrick Grinder, NP. We discussed all aspects of the encounter. I agree with the assessment and plan as stated above.   She remains in AF with RVR despite IV amio. Volume status likely low. Stop IV lasix. Renal function looks good.   Plan DC-CV today on amio and xarelto.   Riniyah Speich,MD 1:47 PM

## 2015-12-22 NOTE — Interval H&P Note (Signed)
History and Physical Interval Note:  12/22/2015 1:46 PM  Melissa Powers  has presented today for surgery, with the diagnosis of a fib  The various methods of treatment have been discussed with the patient and family. After consideration of risks, benefits and other options for treatment, the patient has consented to  Procedure(s): CARDIOVERSION (N/A) as a surgical intervention .  The patient's history has been reviewed, patient examined, no change in status, stable for surgery.  I have reviewed the patient's chart and labs.  Questions were answered to the patient's satisfaction.     [name], Quillian Quince

## 2015-12-22 NOTE — Anesthesia Preprocedure Evaluation (Addendum)
Anesthesia Evaluation  Patient identified by MRN, date of birth, ID band Patient awake    Reviewed: Allergy & Precautions, NPO status , Patient's Chart, lab work & pertinent test results, reviewed documented beta blocker date and time   Airway Mallampati: II       Dental  (+) Edentulous Upper, Edentulous Lower   Pulmonary sleep apnea , COPD,    breath sounds clear to auscultation       Cardiovascular hypertension, Pt. on medications and Pt. on home beta blockers +CHF   Rhythm:Irregular Rate:Abnormal     Neuro/Psych PSYCHIATRIC DISORDERS Anxiety negative neurological ROS     GI/Hepatic negative GI ROS, Neg liver ROS,   Endo/Other  diabetes, Type 2, Insulin Dependent  Renal/GU Renal disease  negative genitourinary   Musculoskeletal negative musculoskeletal ROS (+)   Abdominal   Peds negative pediatric ROS (+)  Hematology negative hematology ROS (+)   Anesthesia Other Findings   Reproductive/Obstetrics negative OB ROS                            Echo - Left ventricle: The cavity size was normal. Wall thickness was   increased in a pattern of mild LVH. Systolic function was normal.   The estimated ejection fraction was in the range of 55% to 60%.   Doppler parameters are consistent with both elevated ventricular   end-diastolic filling pressure and elevated left atrial filling   pressure. - Aortic valve: Valve area (VTI): 2.02 cm^2. Valve area (Vmax):   1.79 cm^2. Valve area (Vmean): 2.02 cm^2. - Mitral valve: There was mild to moderate regurgitation. Valve   area by pressure half-time: 2.42 cm^2. Valve area by continuity   equation (using LVOT flow): 1.96 cm^2. - Left atrium: The atrium was moderately dilated. - Right ventricle: The cavity size was mildly dilated. - Right atrium: The atrium was mildly dilated. - Atrial septum: No defect or patent foramen ovale was identified. -  Pulmonary arteries: PA peak pressure: 69 mm Hg (S).  Anesthesia Physical Anesthesia Plan  ASA: III  Anesthesia Plan: MAC   Post-op Pain Management:    Induction: Intravenous  Airway Management Planned: Natural Airway  Additional Equipment:   Intra-op Plan:   Post-operative Plan:   Informed Consent: I have reviewed the patients History and Physical, chart, labs and discussed the procedure including the risks, benefits and alternatives for the proposed anesthesia with the patient or authorized representative who has indicated his/her understanding and acceptance.     Plan Discussed with: CRNA  Anesthesia Plan Comments:         Anesthesia Quick Evaluation

## 2015-12-22 NOTE — CV Procedure (Signed)
     DIRECT CURRENT CARDIOVERSION  NAME:  Melissa WOHLER   MRN: [id number] DOB:  Nov 29, 1945   ADMIT DATE: 12/19/2015   INDICATIONS: Atrial fibrillation    PROCEDURE:   Informed consent was obtained prior to the procedure. The risks, benefits and alternatives for the procedure were discussed and the patient comprehended these risks. Once an appropriate time out was taken, the patient had the defibrillator pads placed in the anterior and posterior position. The patient then underwent sedation by the anesthesia service. Once an appropriate level of sedation was achieved, the patient received a single biphasic, synchronized 200J shock with prompt conversion to sinus rhythm. No apparent complications.  Bensimhon, Daniel,MD 1:48 PM

## 2015-12-22 NOTE — Progress Notes (Signed)
Pt currently admitted to [facility], monitor not needed at this time.

## 2015-12-22 NOTE — Progress Notes (Signed)
Patient ID: Melissa Powers, female   DOB: 05/28/1945, 70 y.o.   MRN: [id number] Patient did not show up for 12/22/2015, 12:30 PM, appointment to have a 30 day cardiac event monitor applied.

## 2015-12-23 ENCOUNTER — Encounter (HOSPITAL_COMMUNITY): Payer: Self-pay | Admitting: Internal Medicine

## 2015-12-23 LAB — BASIC METABOLIC PANEL
Anion gap: 10 (ref 5–15)
BUN: 51 mg/dL — AB (ref 6–20)
CHLORIDE: 97 mmol/L — AB (ref 101–111)
CO2: 29 mmol/L (ref 22–32)
CREATININE: 1.73 mg/dL — AB (ref 0.44–1.00)
Calcium: 9.2 mg/dL (ref 8.9–10.3)
GFR calc Af Amer: 33 mL/min — ABNORMAL LOW (ref >60)
GFR calc non Af Amer: 29 mL/min — ABNORMAL LOW (ref >60)
GLUCOSE: 128 mg/dL — AB (ref 65–99)
POTASSIUM: 4.1 mmol/L (ref 3.5–5.1)
Sodium: 136 mmol/L (ref 135–145)

## 2015-12-23 LAB — CBC
HEMATOCRIT: 29.4 % — AB (ref 36.0–46.0)
Hemoglobin: 8.9 g/dL — ABNORMAL LOW (ref 12.0–15.0)
MCH: 26.6 pg (ref 26.0–34.0)
MCHC: 30.3 g/dL (ref 30.0–36.0)
MCV: 88 fL (ref 78.0–100.0)
Platelets: 203 10*3/uL (ref 150–400)
RBC: 3.34 MIL/uL — ABNORMAL LOW (ref 3.87–5.11)
RDW: 13.9 % (ref 11.5–15.5)
WBC: 7.7 10*3/uL (ref 4.0–10.5)

## 2015-12-23 LAB — GLUCOSE, CAPILLARY
GLUCOSE-CAPILLARY: 138 mg/dL — AB (ref 65–99)
Glucose-Capillary: 115 mg/dL — ABNORMAL HIGH (ref 65–99)
Glucose-Capillary: 181 mg/dL — ABNORMAL HIGH (ref 65–99)

## 2015-12-23 MED ORDER — HYDRALAZINE HCL 50 MG PO TABS
50.0000 mg | ORAL_TABLET | Freq: Three times a day (TID) | ORAL | Status: DC
  Administered 2015-12-23 (×2): 50 mg via ORAL
  Filled 2015-12-23 (×2): qty 1

## 2015-12-23 MED ORDER — AMIODARONE HCL 200 MG PO TABS
200.0000 mg | ORAL_TABLET | Freq: Two times a day (BID) | ORAL | Status: DC
  Administered 2015-12-23: 200 mg via ORAL
  Filled 2015-12-23: qty 1

## 2015-12-23 MED ORDER — HYDRALAZINE HCL 50 MG PO TABS: 50.0000 mg | ORAL_TABLET | Freq: Three times a day (TID) | ORAL | 6 refills | Status: DC

## 2015-12-23 MED ORDER — AMIODARONE HCL 200 MG PO TABS
200.0000 mg | ORAL_TABLET | Freq: Two times a day (BID) | ORAL | 6 refills | Status: DC
Start: 2015-12-23 — End: 2015-12-29

## 2015-12-23 MED ORDER — MUPIROCIN 2 % EX OINT: 1.0000 "application " | TOPICAL_OINTMENT | Freq: Two times a day (BID) | CUTANEOUS | 0 refills | Status: DC

## 2015-12-23 MED ORDER — CEFUROXIME AXETIL 250 MG PO TABS: 250.0000 mg | ORAL_TABLET | Freq: Two times a day (BID) | ORAL | 0 refills | Status: DC

## 2015-12-23 MED ORDER — MECLIZINE HCL 12.5 MG PO TABS: 12.5000 mg | ORAL_TABLET | Freq: Two times a day (BID) | ORAL | 0 refills | Status: AC | PRN

## 2015-12-23 MED ORDER — TORSEMIDE 20 MG PO TABS: 40.0000 mg | ORAL_TABLET | Freq: Two times a day (BID) | ORAL | 3 refills | Status: DC

## 2015-12-23 NOTE — Discharge Instructions (Signed)
Atrial Fibrillation °Atrial fibrillation is a type of irregular or rapid heartbeat (arrhythmia). In atrial fibrillation, the heart quivers continuously in a chaotic pattern. This occurs when parts of the heart receive disorganized signals that make the heart unable to pump blood normally. This can increase the risk for stroke, heart failure, and other heart-related conditions. There are different types of atrial fibrillation, including: °· Paroxysmal atrial fibrillation. This type starts suddenly, and it usually stops on its own shortly after it starts. °· Persistent atrial fibrillation. This type often lasts longer than a week. It may stop on its own or with treatment. °· Long-lasting persistent atrial fibrillation. This type lasts longer than 12 months. °· Permanent atrial fibrillation. This type does not go away. °Talk with your health care provider to learn about the type of atrial fibrillation that you have. °CAUSES °This condition is caused by some heart-related conditions or procedures, including: °· A heart attack. °· Coronary artery disease. °· Heart failure. °· Heart valve conditions. °· High blood pressure. °· Inflammation of the sac that surrounds the heart (pericarditis). °· Heart surgery. °· Certain heart rhythm disorders, such as Wolf-Parkinson-White syndrome. °Other causes include: °· Pneumonia. °· Obstructive sleep apnea. °· Blockage of an artery in the lungs (pulmonary embolism, or PE). °· Lung cancer. °· Chronic lung disease. °· Thyroid problems, especially if the thyroid is overactive (hyperthyroidism). °· Caffeine. °· Excessive alcohol use or illegal drug use. °· Use of some medicines, including certain decongestants and diet pills. °Sometimes, the cause cannot be found. °RISK FACTORS °This condition is more likely to develop in: °· People who are older in age. °· People who smoke. °· People who have diabetes mellitus. °· People who are overweight (obese). °· Athletes who exercise  vigorously. °SYMPTOMS °Symptoms of this condition include: °· A feeling that your heart is beating rapidly or irregularly. °· A feeling of discomfort or pain in your chest. °· Shortness of breath. °· Sudden light-headedness or weakness. °· Getting tired easily during exercise. °In some cases, there are no symptoms. °DIAGNOSIS °Your health care provider may be able to detect atrial fibrillation when taking your pulse. If detected, this condition may be diagnosed with: °· An electrocardiogram (ECG). °· A Holter monitor test that records your heartbeat patterns over a 24-hour period. °· Transthoracic echocardiogram (TTE) to evaluate how blood flows through your heart. °· Transesophageal echocardiogram (TEE) to view more detailed images of your heart. °· A stress test. °· Imaging tests, such as a CT scan or chest X-ray. °· Blood tests. °TREATMENT °The main goals of treatment are to prevent blood clots from forming and to keep your heart beating at a normal rate and rhythm. The type of treatment that you receive depends on many factors, such as your underlying medical conditions and how you feel when you are experiencing atrial fibrillation. °This condition may be treated with: °· Medicine to slow down the heart rate, bring the heart's rhythm back to normal, or prevent clots from forming. °· Electrical cardioversion. This is a procedure that resets your heart's rhythm by delivering a controlled, low-energy shock to the heart through your skin. °· Different types of ablation, such as catheter ablation, catheter ablation with pacemaker, or surgical ablation. These procedures destroy the heart tissues that send abnormal signals. When the pacemaker is used, it is placed under your skin to help your heart beat in a regular rhythm. °HOME CARE INSTRUCTIONS °· Take over-the counter and prescription medicines only as told by your health care provider. °·   If your health care provider prescribed a blood-thinning medicine  (anticoagulant), take it exactly as told. Taking too much blood-thinning medicine can cause bleeding. If you do not take enough blood-thinning medicine, you will not have the protection that you need against stroke and other problems.  Do not use tobacco products, including cigarettes, chewing tobacco, and e-cigarettes. If you need help quitting, ask your health care provider.  If you have obstructive sleep apnea, manage your condition as told by your health care provider.  Do not drink alcohol.  Do not drink beverages that contain caffeine, such as coffee, soda, and tea.  Maintain a healthy weight. Do not use diet pills unless your health care provider approves. Diet pills may make heart problems worse.  Follow diet instructions as told by your health care provider.  Exercise regularly as told by your health care provider.  Keep all follow-up visits as told by your health care provider. This is important. PREVENTION  Avoid drinking beverages that contain caffeine or alcohol.  Avoid certain medicines, especially medicines that are used for breathing problems.  Avoid certain herbs and herbal medicines, such as those that contain ephedra or ginseng.  Do not use illegal drugs, such as cocaine and amphetamines.  Do not smoke.  Manage your high blood pressure. SEEK MEDICAL CARE IF:  You notice a change in the rate, rhythm, or strength of your heartbeat.  You are taking an anticoagulant and you notice increased bruising.  You tire more easily when you exercise or exert yourself. SEEK IMMEDIATE MEDICAL CARE IF:  You have chest pain, abdominal pain, sweating, or weakness.  You feel nauseous.  You notice blood in your vomit, bowel movement, or urine.  You have shortness of breath.  You suddenly have swollen feet and ankles.  You feel dizzy.  You have sudden weakness or numbness of the face, arm, or leg, especially on one side of the body.  You have trouble speaking,  trouble understanding, or both (aphasia).  Your face or your eyelid droops on one side. These symptoms may represent a serious problem that is an emergency. Do not wait to see if the symptoms will go away. Get medical help right away. Call your local emergency services (911 in the [place] Do not drive yourself to the hospital.   This information is not intended to replace advice given to you by your health care provider. Make sure you discuss any questions you have with your health care provider.   Document Released: 03/06/2005 Document Revised: 11/25/2014 Document Reviewed: 07/01/2014 Elsevier Interactive Patient Education 2016 [employer] Heart Failure Heart failure is a condition in which the heart has trouble pumping blood. This means your heart does not pump blood efficiently for your body to work well. In some cases of heart failure, fluid may back up into your lungs or you may have swelling (edema) in your lower legs. Heart failure is usually a long-term (chronic) condition. It is important for you to take good care of yourself and follow your health care provider's treatment plan. CAUSES  Some health conditions can cause heart failure. Those health conditions include:  High blood pressure (hypertension). Hypertension causes the heart muscle to work harder than normal. When pressure in the blood vessels is high, the heart needs to pump (contract) with more force in order to circulate blood throughout the body. High blood pressure eventually causes the heart to become stiff and weak.  Coronary artery disease (CAD). CAD is the buildup of cholesterol and fat (  plaque) in the arteries of the heart. The blockage in the arteries deprives the heart muscle of oxygen and blood. This can cause chest pain and may lead to a heart attack. High blood pressure can also contribute to CAD.  Heart attack (myocardial infarction). A heart attack occurs when one or more arteries in the heart become blocked. The  loss of oxygen damages the muscle tissue of the heart. When this happens, part of the heart muscle dies. The injured tissue does not contract as well and weakens the heart's ability to pump blood.  Abnormal heart valves. When the heart valves do not open and close properly, it can cause heart failure. This makes the heart muscle pump harder to keep the blood flowing.  Heart muscle disease (cardiomyopathy or myocarditis). Heart muscle disease is damage to the heart muscle from a variety of causes. These can include drug or alcohol abuse, infections, or unknown reasons. These can increase the risk of heart failure.  Lung disease. Lung disease makes the heart work harder because the lungs do not work properly. This can cause a strain on the heart, leading it to fail.  Diabetes. Diabetes increases the risk of heart failure. High blood sugar contributes to high fat (lipid) levels in the blood. Diabetes can also cause slow damage to tiny blood vessels that carry important nutrients to the heart muscle. When the heart does not get enough oxygen and food, it can cause the heart to become weak and stiff. This leads to a heart that does not contract efficiently.  Other conditions can contribute to heart failure. These include abnormal heart rhythms, thyroid problems, and low blood counts (anemia). Certain unhealthy behaviors can increase the risk of heart failure, including:  Being overweight.  Smoking or chewing tobacco.  Eating foods high in fat and cholesterol.  Abusing illicit drugs or alcohol.  Lacking physical activity. SYMPTOMS  Heart failure symptoms may vary and can be hard to detect. Symptoms may include:  Shortness of breath with activity, such as climbing stairs.  Persistent cough.  Swelling of the feet, ankles, legs, or abdomen.  Unexplained weight gain.  Difficulty breathing when lying flat (orthopnea).  Waking from sleep because of the need to sit up and get more air.  Rapid  heartbeat.  Fatigue and loss of energy.  Feeling light-headed, dizzy, or close to fainting.  Loss of appetite.  Nausea.  Increased urination during the night (nocturia). DIAGNOSIS  A diagnosis of heart failure is based on your history, symptoms, physical examination, and diagnostic tests. Diagnostic tests for heart failure may include:  Echocardiography.  Electrocardiography.  Chest X-ray.  Blood tests.  Exercise stress test.  Cardiac angiography.  Radionuclide scans. TREATMENT  Treatment is aimed at managing the symptoms of heart failure. Medicines, behavioral changes, or surgical intervention may be necessary to treat heart failure.  Medicines to help treat heart failure may include:  Angiotensin-converting enzyme (ACE) inhibitors. This type of medicine blocks the effects of a blood protein called angiotensin-converting enzyme. ACE inhibitors relax (dilate) the blood vessels and help lower blood pressure.  Angiotensin receptor blockers (ARBs). This type of medicine blocks the actions of a blood protein called angiotensin. Angiotensin receptor blockers dilate the blood vessels and help lower blood pressure.  Water pills (diuretics). Diuretics cause the kidneys to remove salt and water from the blood. The extra fluid is removed through urination. This loss of extra fluid lowers the volume of blood the heart pumps.  Beta blockers. These prevent the heart  from beating too fast and improve heart muscle strength.  Digitalis. This increases the force of the heartbeat.  Healthy behavior changes include:  Obtaining and maintaining a healthy weight.  Stopping smoking or chewing tobacco.  Eating heart-healthy foods.  Limiting or avoiding alcohol.  Stopping illicit drug use.  Physical activity as directed by your health care provider.  Surgical treatment for heart failure may include:  A procedure to open blocked arteries, repair damaged heart valves, or remove damaged  heart muscle tissue.  A pacemaker to improve heart muscle function and control certain abnormal heart rhythms.  An internal cardioverter defibrillator to treat certain serious abnormal heart rhythms.  A left ventricular assist device (LVAD) to assist the pumping ability of the heart. HOME CARE INSTRUCTIONS   Take medicines only as directed by your health care provider. Medicines are important in reducing the workload of your heart, slowing the progression of heart failure, and improving your symptoms.  Do not stop taking your medicine unless directed by your health care provider.  Do not skip any dose of medicine.  Refill your prescriptions before you run out of medicine. Your medicines are needed every day.  Engage in moderate physical activity if directed by your health care provider. Moderate physical activity can benefit some people. The elderly and people with severe heart failure should consult with a health care provider for physical activity recommendations.  Eat heart-healthy foods. Food choices should be free of trans fat and low in saturated fat, cholesterol, and salt (sodium). Healthy choices include fresh or frozen fruits and vegetables, fish, lean meats, legumes, fat-free or low-fat dairy products, and whole grain or high fiber foods. Talk to a dietitian to learn more about heart-healthy foods.  Limit sodium if directed by your health care provider. Sodium restriction may reduce symptoms of heart failure in some people. Talk to a dietitian to learn more about heart-healthy seasonings.  Use healthy cooking methods. Healthy cooking methods include roasting, grilling, broiling, baking, poaching, steaming, or stir-frying. Talk to a dietitian to learn more about healthy cooking methods.  Limit fluids if directed by your health care provider. Fluid restriction may reduce symptoms of heart failure in some people.  Weigh yourself every day. Daily weights are important in the early  recognition of excess fluid. You should weigh yourself every morning after you urinate and before you eat breakfast. Wear the same amount of clothing each time you weigh yourself. Record your daily weight. Provide your health care provider with your weight record.  Monitor and record your blood pressure if directed by your health care provider.  Check your pulse if directed by your health care provider.  Lose weight if directed by your health care provider. Weight loss may reduce symptoms of heart failure in some people.  Stop smoking or chewing tobacco. Nicotine makes your heart work harder by causing your blood vessels to constrict. Do not use nicotine gum or patches before talking to your health care provider.  Keep all follow-up visits as directed by your health care provider. This is important.  Limit alcohol intake to no more than 1 drink per day for nonpregnant women and 2 drinks per day for men. One drink equals 12 ounces of beer, 5 ounces of wine, or 1 ounces of hard liquor. Drinking more than that is harmful to your heart. Tell your health care provider if you drink alcohol several times a week. Talk with your health care provider about whether alcohol is safe for you. If your  heart has already been damaged by alcohol or you have severe heart failure, drinking alcohol should be stopped completely.  Stop illicit drug use.  Stay up-to-date with immunizations. It is especially important to prevent respiratory infections through current pneumococcal and influenza immunizations.  Manage other health conditions such as hypertension, diabetes, thyroid disease, or abnormal heart rhythms as directed by your health care provider.  Learn to manage stress.  Plan rest periods when fatigued.  Learn strategies to manage high temperatures. If the weather is extremely hot:  Avoid vigorous physical activity.  Use air conditioning or fans or seek a cooler location.  Avoid caffeine and  alcohol.  Wear loose-fitting, lightweight, and light-colored clothing.  Learn strategies to manage cold temperatures. If the weather is extremely cold:  Avoid vigorous physical activity.  Layer clothes.  Wear mittens or gloves, a hat, and a scarf when going outside.  Avoid alcohol.  Obtain ongoing education and support as needed.  Participate in or seek rehabilitation as needed to maintain or improve independence and quality of life. SEEK MEDICAL CARE IF:   You have a rapid weight gain.  You have increasing shortness of breath that is unusual for you.  You are unable to participate in your usual physical activities.  You tire easily.  You cough more than normal, especially with physical activity.  You have any or more swelling in areas such as your hands, feet, ankles, or abdomen.  You are unable to sleep because it is hard to breathe.  You feel like your heart is beating fast (palpitations).  You become dizzy or light-headed upon standing up. SEEK IMMEDIATE MEDICAL CARE IF:   You have difficulty breathing.  There is a change in mental status such as decreased alertness or difficulty with concentration.  You have a pain or discomfort in your chest.  You have an episode of fainting (syncope). MAKE SURE YOU:   Understand these instructions.  Will watch your condition.  Will get help right away if you are not doing well or get worse.   This information is not intended to replace advice given to you by your health care provider. Make sure you discuss any questions you have with your health care provider.   Document Released: 03/06/2005 Document Revised: 07/21/2014 Document Reviewed: 04/05/2012 Elsevier Interactive Patient Education [employer].

## 2015-12-23 NOTE — Care Management Important Message (Signed)
Important Message  Patient Details  Name: Melissa Powers MRN: [id number] Date of Birth: 04/30/1945   Medicare Important Message Given:  Yes    Demarius Archila T, RN 12/23/2015, 2:57 PM

## 2015-12-23 NOTE — Discharge Summary (Signed)
Physician Discharge Summary  Melissa Powers [id number] DOB: 03-19-1946 DOA: 12/19/2015  PCP: Maris Berger, MD  Admit date: 12/19/2015 Discharge date: 12/23/2015  Time spent: 45 minutes  Recommendations for Outpatient Follow-up:  Patient will be discharged to home.  Patient will need to follow up with primary care provider within one week of discharge.  Follow up with Dr. Haroldine Laws in 2 weeks. Patient should continue medications as prescribed. Start Torsemide on 12/24/2015. Patient should follow a heart healthy/carb modified diet.   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 Vincent Peyer Goochis a 70 y.o.femalewith a past medical history significant for HFpEF, Afib on Xarelto, CKD Stage III, IDDM, HTN, anemia, and Parkinson's diseasewho presents with 1 week worsening malaise and 1 day vertigo, falling, chest pain. The patient first started feeling worse than usual about a week ago, with worsening intermittent tachycardia and vertigo and vague malaise. Then, over the last 24 hours, she has noticed fever, shortness of breath, intermittent chest pain, foul-smelling urine, urinary incontinence, and finally severe vertigo/spinning (this had been present for >2 weeks) and so she fell twice and  then came to the ER.  Hospital Course:  Sepsis secondary to UTI, POA  -Upon admission, patient was tachycardic, tachypneic with soft BP -Urine culture showed multiple species -Blood cultures show no growth to date -Was give IVF  -Initially placed on ceftriaxone, will discharge with ceftin for additional 3 days.  Atrial fibrillation with RVR -CHADS2Vasc 6 -Given diltiazem gtt in ED. Stopped prior to admission since HR better controlled and BP low  -Cardiology consulted and appreciated, followed by heart failure team -Was placed on amiodarone gtt, xarelto -s/p DCCV 12/22/2015 -Cardiology planning to transition to PO amiodarone today- 200mg  BID -Metoprolol held due to bradycardia  Acute on Chronic diastolic CHF -Echocardiogram 10/20/2015 showed EF 55-60% -Cardiology consulted and appreciated -Was on IV lasix. Cardiology plans to restart torsemide 40mg  BID on 12/24/2015. Will restart hydralazine and valsartain, hold amlodipine and metoprolol.  -monitor intake/output, daily weights -improving  Parkinson's disease -Continue Sinemet, requip  Diabetes mellitus, type 2 with neuropathy -No longer on Januvia or Invokana -Continue ISS and CBG monitoring  -Continue Neurontin -Hemoglobin A2c 6.3 (10/22/2015)  Essential hypertension -Hypotensive at admission because of atrial fibrillation, diastolic dysfunction. -Restart valsartan and hydralazine -Hold metoprolol, amlodipine  CAD -Currently chest pain free, continue aspirin and statin  CKD stage 4 -Baseline Cr 2-2.5 -Creatinine currently 1.73  Depression -Continue celexa  Vertigo -Continue antivert   Consultants Cardiology   Procedures  DCCV  Discharge Exam: Vitals:   12/23/15 1306 12/23/15 1312  BP:  102/63  Pulse: 62   Resp: 17   Temp: 98.5 F (36.9 C)    Exam  General: Well developed, well nourished, no distress  HEENT: NCAT, mucous membranes moist.  Cardiovascular: S1 S2 auscultated, regular, no  murmurs  Respiratory: Clear to auscultation bilaterally with equal chest rise  Abdomen: Soft, nontender, nondistended, + bowel sounds  Extremities: warm dry without cyanosis clubbing or edema  Neuro: AAOx3, nonfocal  Psych: Normal affect and demeanor with intact judgement and insight, pleasant  Discharge Instructions Discharge Instructions    Discharge instructions    Complete by:  As directed    Patient will be discharged to home.  Patient will need to follow up with primary care provider within one week of discharge.  Follow up with Dr. Haroldine Laws in 2 weeks. Patient should continue medications as prescribed. Start Torsemide on 12/24/2015. Patient should follow a heart healthy/carb modified diet.     Current Discharge Medication List    START taking these medications   Details  amiodarone (PACERONE) 200 MG tablet Take 1 tablet (200 mg total) by mouth 2 (two) times daily. Qty: 60 tablet, Refills: 6    cefUROXime (CEFTIN) 250 MG tablet Take 1 tablet (250 mg total) by mouth 2 (two) times daily with a meal. Qty: 6 tablet, Refills: 0    meclizine (ANTIVERT) 12.5 MG tablet Take 1 tablet (12.5 mg total) by mouth 2 (two) times daily as needed for dizziness. Qty: 30 tablet, Refills: 0    mupirocin ointment (BACTROBAN) 2 % Place 1 application into the nose 2 (two) times daily. Qty: 22 g, Refills: 0      CONTINUE these medications which have CHANGED   Details  hydrALAZINE (APRESOLINE) 50 MG tablet Take 1 tablet (50 mg total) by mouth 3 (three) times daily. Qty: 90 tablet, Refills: 6    torsemide (DEMADEX) 20 MG tablet Take 2 tablets (40 mg total) by mouth 2 (two) times daily. Qty: 150 tablet, Refills: 3      CONTINUE these medications which have NOT CHANGED   Details  aspirin EC 81 MG EC tablet Take 1 tablet (81 mg total) by mouth daily.    atorvastatin (LIPITOR) 20 MG tablet Take 1 tablet (20 mg total) by mouth daily at 6 PM. Qty: 30 tablet, Refills: 0    carbidopa-levodopa  (SINEMET IR) 25-100 MG per tablet Take 1 tablet by mouth 3 (three) times daily.    cholecalciferol (VITAMIN D) 1000 units tablet Take 2,000 Units by mouth daily.    citalopram (CELEXA) 20 MG tablet Take 20 mg by mouth daily.    gabapentin (NEURONTIN) 100 MG capsule Take 300 mg by mouth 3 (three) times daily.     insulin aspart (NOVOLOG) 100 UNIT/ML injection Inject 10 Units into the skin at bedtime.    Rivaroxaban (XARELTO) 15 MG TABS tablet Take 15 mg by mouth daily with supper.    rOPINIRole (REQUIP) 0.25 MG tablet Take 1 tablet (0.25 mg total) by mouth 3 (three) times daily. Qty: 30 tablet, Refills: 0    traZODone (DESYREL) 50 MG tablet Take 50 mg by mouth at bedtime.     valsartan (DIOVAN) 320 MG tablet Take 320 mg by mouth daily.      STOP taking these medications     amLODipine (NORVASC) 10 MG tablet      metoprolol tartrate (LOPRESSOR) 25 MG tablet        Allergies  Allergen Reactions  . Morphine And Related Other (See Comments)    Pt reports intolerance due to sedation from morphine   Follow-up [place] Follow up on 01/05/2016.   Specialty:  Cardiology Why:  at 1100 am for post hospital follow up. Please bringa all of your medications to your visit. The code for parking is 4000. Contact information: [address] [id number] Navarre Iota, [place]. Schedule an appointment as soon as possible for a visit in 1 week(s).   Specialty:  Family Medicine Why:  Hospital follow up Contact information: [address] [place] [address] [phone]            The results of significant diagnostics from this hospitalization (including imaging, microbiology, ancillary and laboratory) are listed below for reference.    Significant Diagnostic Studies: Dg Chest Port 1 View  Result Date: 12/20/2015 CLINICAL DATA:  Two falls  today. Dizziness. Fever and nonproductive cough. EXAM: PORTABLE CHEST 1 VIEW COMPARISON:  12/01/2015 FINDINGS: Generator pack with stimulator leads extending into the left jugular region. Shallow inspiration. Heart size and pulmonary vascularity are normal for technique. Vascular crowding. No focal consolidation or airspace disease. No blunting of costophrenic angles. No pneumothorax. Calcification of the aorta. IMPRESSION: Shallow inspiration with vascular crowding. No evidence of active pulmonary disease. Electronically Signed   By: Lucienne Capers M.D.   On: 12/20/2015 00:16    Microbiology: Recent Results (from the past 240 hour(s))  Urine culture     Status: Abnormal   Collection Time: 12/19/15  2:20 AM  Result Value Ref Range Status   Specimen Description URINE, CLEAN CATCH  Final   Special Requests NONE  Final   Culture MULTIPLE SPECIES PRESENT, SUGGEST RECOLLECTION (A)  Final   Report Status 12/21/2015 FINAL  Final  Blood Culture (routine x 2)     Status: None (Preliminary result)   Collection Time: 12/19/15 11:56 PM  Result Value Ref Range Status   Specimen Description BLOOD RIGHT HAND  Final   Special Requests BOTTLES DRAWN AEROBIC AND ANAEROBIC 5CC EA  Final   Culture NO GROWTH 3 DAYS  Final   Report Status PENDING  Incomplete  Blood Culture (routine x 2)     Status: None (Preliminary result)   Collection Time: 12/20/15 12:05 AM  Result Value Ref Range Status   Specimen Description BLOOD LEFT HAND  Final   Special Requests BOTTLES DRAWN AEROBIC AND ANAEROBIC 5CC EA  Final   Culture NO GROWTH 3 DAYS  Final   Report Status PENDING  Incomplete  MRSA PCR Screening     Status: Abnormal   Collection Time: 12/20/15 12:40 PM  Result Value Ref Range Status   MRSA by PCR POSITIVE (A) NEGATIVE Final    Comment:        The GeneXpert MRSA Assay (FDA approved for NASAL specimens only), is one component of a comprehensive MRSA colonization surveillance program. It is not intended to  diagnose MRSA infection nor to guide or monitor treatment for MRSA infections. RESULT CALLED TO, READ BACK BY AND VERIFIED WITH: Pia Mau RN 14:30 12/20/15 ([email or web address])      Labs: Basic Metabolic Panel:  Recent Labs Lab 12/19/15 2239 12/20/15 1115 12/21/15 0349 12/22/15 0227 12/23/15 0331  NA 133*  --  136 136 136  K 3.7  --  4.3 4.5 4.1  CL 94*  --  98* 93* 97*  CO2 28  --  30 30 29   GLUCOSE 199*  --  132* 133* 128*  BUN 95*  --  68* 57* 51*  CREATININE 2.05*  --  1.66* 1.71* 1.73*  CALCIUM 9.5  --  9.2 9.4 9.2  MG  --  2.2 2.2  --   --    Liver Function Tests:  Recent Labs Lab 12/20/15 1115  AST 19  ALT 13*  ALKPHOS 37*  BILITOT 0.8  PROT 6.7  ALBUMIN 3.3*   No results for input(s): LIPASE, AMYLASE in the last 168 hours. No results for input(s): AMMONIA in the last 168 hours. CBC:  Recent Labs Lab 12/19/15 2239 12/19/15 2314 12/20/15 1115 12/23/15 0331  WBC 10.9*  --  8.3 7.7  NEUTROABS  --  8.9*  --   --   HGB 9.1*  --  8.4* 8.9*  HCT 29.5*  --  26.4* 29.4*  MCV 87.8  --  86.8 88.0  PLT 220  --  197 203   Cardiac Enzymes:  Recent Labs Lab 12/20/15 1115  TROPONINI <0.03   BNP: BNP (last 3 results)  Recent Labs  10/19/15 1129 11/24/15 1222 12/19/15 2320  BNP 1,057.9* 247.2* 800.7*    ProBNP (last 3 results) No results for input(s): PROBNP in the last 8760 hours.  CBG:  Recent Labs Lab 12/22/15 1719 12/22/15 2203 12/23/15 0622 12/23/15 0826 12/23/15 1305  GLUCAP 198* 162* 115* 138* 181*       Signed:  Madalynne Gutmann  [facility] 12/23/2015, 2:46 PM

## 2015-12-23 NOTE — Anesthesia Postprocedure Evaluation (Signed)
Anesthesia Post Note  Patient: Kentrell P Chojnowski  Procedure(s) Performed: Procedure(s) (LRB): CARDIOVERSION (N/A)  Patient location during evaluation: Endoscopy Anesthesia Type: MAC Level of consciousness: awake and alert Pain management: pain level controlled Vital Signs Assessment: post-procedure vital signs reviewed and stable Respiratory status: spontaneous breathing, nonlabored ventilation, respiratory function stable and patient connected to nasal cannula oxygen Cardiovascular status: stable and blood pressure returned to baseline Anesthetic complications: no    Last Vitals:  Vitals:   12/23/15 0545 12/23/15 0827  BP:  (!) 141/57  Pulse:  (!) 55  Resp:  16  Temp: 36.6 C 36.6 C    Last Pain:  Vitals:   12/23/15 0827  TempSrc: Oral  PainSc:                  Effie Berkshire

## 2015-12-23 NOTE — Progress Notes (Signed)
PROGRESS NOTE    Melissa Powers  [id number] DOB: Jul 31, 1945 DOA: 12/19/2015 PCP: Maris Berger, MD   Chief Complaint  Patient presents with  . Fall  . Fever  . Cough  . Dizziness     Brief Narrative:  HPI on 12/20/2015 by Dr. Myrene Buddy Melissa Powers is a 70 y.o. female with a past medical history significant for HFpEF, Afib on Xarelto, CKD Stage III, IDDM, HTN, anemia, and Parkinson's disease who presents with 1 week worsening malaise and 1 day vertigo, falling, chest pain. The patient first started feeling worse than usual about a week ago, with worsening intermittent tachycardia and vertigo and vague malaise.  Then, over the last 24 hours, she has noticed fever, shortness of breath, intermittent chest pain, foul-smelling urine, urinary incontinence, and finally severe vertigo/spinning (this had been present for >2 weeks) and so she fell twice and then came to the ER.  Assessment & Plan   Sepsis secondary to UTI, POA  -Upon admission, patient was tachycardic, tachypneic with soft BP -Urine culture showed multiple species -Blood cultures show no growth to date -Was give IVF  -Continue ceftriaxone  Atrial fibrillation with RVR -CHADS2Vasc 6 -Given diltiazem gtt in ED. Stopped prior to admission since HR better controlled and BP low  -Cardiology consulted and appreciated, followed by heart failure team -Continue amiodarone gtt, xarelto -s/p DCCV 12/22/2015 -Cardiology planning to transition to PO amiodarone today -Metoprolol held due to bradycardia  Acute on Chronic diastolic CHF -Echocardiogram 10/20/2015 showed EF 55-60% -Cardiology consulted and appreciated -Was on IV lasix. Cardiology plans to restart torsemide 40mg  BID on 12/24/2015. Will restart hydralazine and valsartain, hold amlodipine and metoprolol.  -monitor intake/output, daily weights -improving  Parkinson's disease -Continue Sinemet, requip  Diabetes mellitus, type 2 with neuropathy -No longer  on Januvia or Invokana -Continue ISS and CBG monitoring  -Continue Neurontin -Hemoglobin A2c 6.3 (10/22/2015)  Essential hypertension -Hypotensive at admission because of atrial fibrillation, diastolic dysfunction. -Restart valsartan and hydralazine -Hold metoprolol, amlodipine  CAD -Currently chest pain free, continue aspirin and statin  CKD stage 4 -Baseline Cr 2-2.5 -Creatinine currently 1.73 -Continue to monitor BMP  Depression -Continue celexa  Vertigo -Continue antivert   DVT Prophylaxis  Xarelto  Code Status: Full  Family Communication: None at bedside  Disposition Plan: Admitted. Cardiology changing meds today. Likely d/c 10/6.   Consultants Cardiology   Procedures  DCCV  Antibiotics   Anti-infectives    Start     Dose/Rate Route Frequency Ordered Stop   12/20/15 2200  cefTRIAXone (ROCEPHIN) 1 g in dextrose 5 % 50 mL IVPB     1 g 100 mL/hr over 30 Minutes Intravenous Every 24 hours 12/20/15 0643     12/20/15 0030  cefTRIAXone (ROCEPHIN) 1 g in dextrose 5 % 50 mL IVPB     1 g 100 mL/hr over 30 Minutes Intravenous  Once 12/20/15 0020 12/20/15 0256      Subjective:   Melissa Powers seen and examined today.  Patient states she was able to rest last night. Denies shortness of breath, chest pain, abdominal pain, nausea, vomiting, diarrhea, constipation.  Denies dizziness or headache.  Objective:   Vitals:   12/23/15 0120 12/23/15 0500 12/23/15 0545 12/23/15 0827  BP: (!) 125/42   (!) 141/57  Pulse: (!) 56   (!) 55  Resp: 19   16  Temp: 98.4 F (36.9 C)  97.8 F (36.6 C) 97.8 F (36.6 C)  TempSrc: Oral  Oral Oral  SpO2:  97%   97%  Weight:  87.2 kg (192 lb 4.8 oz)    Height:        Intake/Output Summary (Last 24 hours) at 12/23/15 0953 Last data filed at 12/23/15 [id number]  Gross per 24 hour  Intake            417.2 ml  Output              200 ml  Net            217.2 ml   Filed Weights   12/22/15 0400 12/22/15 1250 12/23/15 0500  Weight: 87.5  kg (192 lb 14.4 oz) 87.1 kg (192 lb) 87.2 kg (192 lb 4.8 oz)    Exam  General: Well developed, well nourished, NAD, appears stated age  18: NCAT, mucous membranes moist.   Neck: Supple,+ JVD, no masses  Cardiovascular: S1 S2 auscultated, regular, bradycardic, no murmurs  Respiratory: Clear to auscultation bilaterally with equal chest rise  Abdomen: Soft, nontender, nondistended, + bowel sounds  Extremities: warm dry without cyanosis clubbing or edema  Neuro: AAOx3, nonfocal  Psych: Normal affect and demeanor with intact judgement and insight, pleasant   Data Reviewed: I have personally reviewed following labs and imaging studies  CBC:  Recent Labs Lab 12/19/15 2239 12/19/15 2314 12/20/15 1115 12/23/15 0331  WBC 10.9*  --  8.3 7.7  NEUTROABS  --  8.9*  --   --   HGB 9.1*  --  8.4* 8.9*  HCT 29.5*  --  26.4* 29.4*  MCV 87.8  --  86.8 88.0  PLT 220  --  197 [id number]   Basic Metabolic Panel:  Recent Labs Lab 12/19/15 2239 12/20/15 1115 12/21/15 0349 12/22/15 0227 12/23/15 0331  NA 133*  --  136 136 136  K 3.7  --  4.3 4.5 4.1  CL 94*  --  98* 93* 97*  CO2 28  --  30 30 29   GLUCOSE 199*  --  132* 133* 128*  BUN 95*  --  68* 57* 51*  CREATININE 2.05*  --  1.66* 1.71* 1.73*  CALCIUM 9.5  --  9.2 9.4 9.2  MG  --  2.2 2.2  --   --    GFR: Estimated Creatinine Clearance: 31.7 mL/min (by C-G formula based on SCr of 1.73 mg/dL (H)). Liver Function Tests:  Recent Labs Lab 12/20/15 1115  AST 19  ALT 13*  ALKPHOS 37*  BILITOT 0.8  PROT 6.7  ALBUMIN 3.3*   No results for input(s): LIPASE, AMYLASE in the last 168 hours. No results for input(s): AMMONIA in the last 168 hours. Coagulation Profile: No results for input(s): INR, PROTIME in the last 168 hours. Cardiac Enzymes:  Recent Labs Lab 12/20/15 1115  TROPONINI <0.03   BNP (last 3 results) No results for input(s): PROBNP in the last 8760 hours. HbA1C: No results for input(s): HGBA1C in the last  72 hours. CBG:  Recent Labs Lab 12/22/15 1219 12/22/15 1719 12/22/15 2203 12/23/15 0622 12/23/15 0826  GLUCAP 164* 198* 162* 115* 138*   Lipid Profile: No results for input(s): CHOL, HDL, LDLCALC, TRIG, CHOLHDL, LDLDIRECT in the last 72 hours. Thyroid Function Tests:  Recent Labs  12/20/15 1115  TSH 1.575   Anemia Panel: No results for input(s): VITAMINB12, FOLATE, FERRITIN, TIBC, IRON, RETICCTPCT in the last 72 hours. Urine analysis:    Component Value Date/Time   COLORURINE YELLOW 12/19/2015 0220   APPEARANCEUR CLOUDY (A) 12/19/2015 0220   LABSPEC 1.010  12/19/2015 0220   PHURINE 5.5 12/19/2015 0220   GLUCOSEU NEGATIVE 12/19/2015 0220   HGBUR NEGATIVE 12/19/2015 0220   BILIRUBINUR NEGATIVE 12/19/2015 0220   KETONESUR NEGATIVE 12/19/2015 0220   PROTEINUR NEGATIVE 12/19/2015 0220   NITRITE POSITIVE (A) 12/19/2015 0220   LEUKOCYTESUR NEGATIVE 12/19/2015 0220   Sepsis Labs: @LABRCNTIP (procalcitonin:4,lacticidven:4)  ) Recent Results (from the past 240 hour(s))  Urine culture     Status: Abnormal   Collection Time: 12/19/15  2:20 AM  Result Value Ref Range Status   Specimen Description URINE, CLEAN CATCH  Final   Special Requests NONE  Final   Culture MULTIPLE SPECIES PRESENT, SUGGEST RECOLLECTION (A)  Final   Report Status 12/21/2015 FINAL  Final  Blood Culture (routine x 2)     Status: None (Preliminary result)   Collection Time: 12/19/15 11:56 PM  Result Value Ref Range Status   Specimen Description BLOOD RIGHT HAND  Final   Special Requests BOTTLES DRAWN AEROBIC AND ANAEROBIC 5CC EA  Final   Culture NO GROWTH 2 DAYS  Final   Report Status PENDING  Incomplete  Blood Culture (routine x 2)     Status: None (Preliminary result)   Collection Time: 12/20/15 12:05 AM  Result Value Ref Range Status   Specimen Description BLOOD LEFT HAND  Final   Special Requests BOTTLES DRAWN AEROBIC AND ANAEROBIC 5CC EA  Final   Culture NO GROWTH 2 DAYS  Final   Report Status  PENDING  Incomplete  MRSA PCR Screening     Status: Abnormal   Collection Time: 12/20/15 12:40 PM  Result Value Ref Range Status   MRSA by PCR POSITIVE (A) NEGATIVE Final    Comment:        The GeneXpert MRSA Assay (FDA approved for NASAL specimens only), is one component of a comprehensive MRSA colonization surveillance program. It is not intended to diagnose MRSA infection nor to guide or monitor treatment for MRSA infections. RESULT CALLED TO, READ BACK BY AND VERIFIED WITH: Pia Mau RN 14:30 12/20/15 ([email or web address])       Radiology Studies: No results found.   Scheduled Meds: . amiodarone  200 mg Oral BID  . aspirin EC  81 mg Oral Daily  . atorvastatin  20 mg Oral q1800  . carbidopa-levodopa  1 tablet Oral TID  . cefTRIAXone (ROCEPHIN)  IV  1 g Intravenous Q24H  . Chlorhexidine Gluconate Cloth  6 each Topical Q0600  . citalopram  20 mg Oral Daily  . gabapentin  100 mg Oral TID  . hydrALAZINE  50 mg Oral Q8H  . insulin aspart  0-5 Units Subcutaneous QHS  . insulin aspart  0-9 Units Subcutaneous TID WC  . mupirocin ointment  1 application Nasal BID  . Rivaroxaban  15 mg Oral Q supper  . rOPINIRole  0.25 mg Oral TID  . sodium chloride flush  3 mL Intravenous Q12H   Continuous Infusions: . sodium chloride       LOS: 3 days   Time Spent in minutes   30 minutes  Klarisa Barman D.O. on 12/23/2015 at 9:53 AM  Between 7am to 7pm - Pager - [phone]  After 7pm go to [email or web address] - password [email or web address]  And look for the night coverage person covering for me after hours  [facility] Office  [phone]

## 2015-12-23 NOTE — Progress Notes (Signed)
Advanced Heart Failure Rounding Note   Subjective:    Started on amio drip 12/20/15 for A fib and diuresed IV lasix.   S/p DCCV 12/22/15. Off IV lasix.   Feels great this morning. Can tell her heart is back in rhythm.  Denies SOB or CP. No lightheadedness   Positive 4 L and down 2 lbs from admission. (bed weights.) I/O not accurate due to urinary incontinence.    Objective:   Weight Range:  Vital Signs:   Temp:  [97.6 F (36.4 C)-98.4 F (36.9 C)] 97.8 F (36.6 C) (10/05 0545) Pulse Rate:  [36-128] 56 (10/05 0120) Resp:  [13-20] 19 (10/05 0120) BP: (124-141)/(42-83) 125/42 (10/05 0120) SpO2:  [97 %-100 %] 97 % (10/05 0120) Weight:  [192 lb (87.1 kg)-192 lb 4.8 oz (87.2 kg)] 192 lb 4.8 oz (87.2 kg) (10/05 0500) Last BM Date: 12/20/15  Weight change: Filed Weights   12/22/15 0400 12/22/15 1250 12/23/15 0500  Weight: 192 lb 14.4 oz (87.5 kg) 192 lb (87.1 kg) 192 lb 4.8 oz (87.2 kg)    Intake/Output:   Intake/Output Summary (Last 24 hours) at 12/23/15 0804 Last data filed at 12/23/15 0729  Gross per 24 hour  Intake            417.2 ml  Output              200 ml  Net            217.2 ml     Physical Exam: General:  Lying in bed. NAD.  HEENT: Normal  Neck: supple. JVP 7-8 cm. Carotids 2+ bilat; no bruits. No thyromegaly or nodule noted. Cor: PMI nondisplaced. RRR. No M/G/R noted. Lungs: CTAB, normal effort Abdomen: soft, NT, ND, no HSM. No bruits or masses. +BS  Extremities: no cyanosis, clubbing, rash, edema Neuro: alert & orientedx3, cranial nerves grossly intact. moves all 4 extremities w/o difficulty. Affect pleasant  Telemetry: NSR/SR 50-60s  Labs: Basic Metabolic Panel:  Recent Labs Lab 12/19/15 2239 12/20/15 1115 12/21/15 0349 12/22/15 0227 12/23/15 0331  NA 133*  --  136 136 136  K 3.7  --  4.3 4.5 4.1  CL 94*  --  98* 93* 97*  CO2 28  --  30 30 29   GLUCOSE 199*  --  132* 133* 128*  BUN 95*  --  68* 57* 51*  CREATININE 2.05*  --  1.66*  1.71* 1.73*  CALCIUM 9.5  --  9.2 9.4 9.2  MG  --  2.2 2.2  --   --     Liver Function Tests:  Recent Labs Lab 12/20/15 1115  AST 19  ALT 13*  ALKPHOS 37*  BILITOT 0.8  PROT 6.7  ALBUMIN 3.3*   No results for input(s): LIPASE, AMYLASE in the last 168 hours. No results for input(s): AMMONIA in the last 168 hours.  CBC:  Recent Labs Lab 12/19/15 2239 12/19/15 2314 12/20/15 1115 12/23/15 0331  WBC 10.9*  --  8.3 7.7  NEUTROABS  --  8.9*  --   --   HGB 9.1*  --  8.4* 8.9*  HCT 29.5*  --  26.4* 29.4*  MCV 87.8  --  86.8 88.0  PLT 220  --  197 203    Cardiac Enzymes:  Recent Labs Lab 12/20/15 1115  TROPONINI <0.03    BNP: BNP (last 3 results)  Recent Labs  10/19/15 1129 11/24/15 1222 12/19/15 2320  BNP 1,057.9* 247.2* 800.7*    ProBNP (  last 3 results) No results for input(s): PROBNP in the last 8760 hours.    Other results:  Imaging: No results found.   Medications:     Scheduled Medications: . aspirin EC  81 mg Oral Daily  . atorvastatin  20 mg Oral q1800  . carbidopa-levodopa  1 tablet Oral TID  . cefTRIAXone (ROCEPHIN)  IV  1 g Intravenous Q24H  . Chlorhexidine Gluconate Cloth  6 each Topical Q0600  . citalopram  20 mg Oral Daily  . gabapentin  100 mg Oral TID  . insulin aspart  0-5 Units Subcutaneous QHS  . insulin aspart  0-9 Units Subcutaneous TID WC  . mupirocin ointment  1 application Nasal BID  . Rivaroxaban  15 mg Oral Q supper  . rOPINIRole  0.25 mg Oral TID  . sodium chloride flush  3 mL Intravenous Q12H    Infusions: . sodium chloride    . amiodarone 30 mg/hr (12/23/15 0804)    PRN Medications: acetaminophen **OR** acetaminophen, meclizine, promethazine, sodium chloride, sodium chloride flush, traZODone, white petrolatum   Assessment:  1. Chest Pain 2. Vertigo 3. UTI 4. A Fib RVR --This patients CHA2DS2-VASc = 6 5. Acute/Chronic diastolic CHF - Echo 123456 LVEF 55-60% 6. Chronic respiratory failure 7. CKD  stage III- Creatinine baseline 1.9-2.2 8. T2DM with neuropathy 9. HTN 10  HLD 11. Anxiety 12. Parkinsons with falls and weakness   Plan/Discussion:    Volume status stable but JVP climbing slightly. Will plan on resuming po torsemide tomorrow at 40 mg BID.    BP climbing off anti hypertensives.  Will not restart lopressor with bradycardia.  Will restart hydralazine and valsartan.  Will hold amlodipine for now. Can add back as needed.  Remains in NSR s/p DCCV 12/22/15. Will transition to amio 200 mg BID. Continue Xarelto.     UTI being treated with rocephin.    Cardiac rehab consulted.   HF meds for home Atorvastatin 20 mg daily Hydralazine 50 mg TID Xarelto 15 mg daily Torsemide 40 mg BID Valsartan 320 mg daily  Will schedule for HF follow up 7-10 days.   Length of Stay: 3   Shirley Friar PA-C 12/23/2015, 8:04 AM  Advanced Heart Failure Team Pager [phone] (M-F; 7a - 4p)  Please contact [facility] for night-coverage after hours (4p -7a ) and weekends on [email or web address]  Patient seen and examined with Oda Kilts, PA-C. We discussed all aspects of the encounter. I agree with the assessment and plan as stated above.   She looks good today. Maintaining NSR. Volume status looks good. Renal function stable. Agree with d/c on above medications. Discussed plan to continue to follow her in the community with the ReDS Vest. I called [facility] nurses to discuss as well.   Layali Freund,MD 9:22 PM

## 2015-12-23 NOTE — Progress Notes (Signed)
Patient given discharge instructions. Patient educated on new medication and changed medication. Patient instructed to make follow up appts. Patient verbalized understanding. Patient discharge home with husband.

## 2015-12-23 NOTE — Progress Notes (Signed)
CARDIAC REHAB PHASE I   PRE:  Rate/Rhythm: 38 SR w/ PACs and PVCs  BP:  Sitting: 132/44       SaO2: 94 RA  MODE:  Ambulation: 140 ft   POST:  Rate/Rhythm: 90 SR w/ PACs and PVCs  BP:  Sitting: 134/54        SaO2: 94 RA  Pt ambulated with Rw, gait belt and two assist.  Would recommend one assist for future.  Pt stopped once to rest but tolerated well overall.  Gave pt CHF booklet to read and will discuss with her in detail tomorrow.  Returned to recliner with feet elevated and call bell in reach.  Will continue to follow. [place], [place] 12/23/2015 12:05 PM

## 2015-12-25 LAB — CULTURE, BLOOD (ROUTINE X 2)
CULTURE: NO GROWTH
CULTURE: NO GROWTH

## 2015-12-29 ENCOUNTER — Telehealth (HOSPITAL_COMMUNITY): Payer: Self-pay | Admitting: Cardiology

## 2015-12-29 DIAGNOSIS — I4891 Unspecified atrial fibrillation: Secondary | ICD-10-CM

## 2015-12-29 MED ORDER — AMIODARONE HCL 200 MG PO TABS: 400.0000 mg | ORAL_TABLET | Freq: Two times a day (BID) | ORAL | 6 refills | Status: DC

## 2015-12-29 NOTE — Telephone Encounter (Signed)
Incoming triage call from [facility] during patients visit Pt seen for heart palps.  HR 121-131 b/p 112/68 weight 195 ekg- Afib  Per Vo DrBensimhon Patient should increase Amirodarone to 400 mg BID Refer to Dr Rayann Heman- for Afib (AV node ablation ?Bi-V) 12/31/15 @ 1145 with Dr Rayann Heman

## 2015-12-31 ENCOUNTER — Encounter (HOSPITAL_COMMUNITY): Payer: Self-pay | Admitting: Nurse Practitioner

## 2015-12-31 ENCOUNTER — Ambulatory Visit (HOSPITAL_COMMUNITY)
Admission: RE | Admit: 2015-12-31 | Discharge: 2015-12-31 | Disposition: A | Discharge status: Home / Self Care | Payer: Medicare (Managed Care) | Source: Ambulatory Visit | Attending: Nurse Practitioner | Admitting: Nurse Practitioner

## 2015-12-31 VITALS — BP 112/48 | HR 54 | Ht 63.0 in | Wt 194.8 lb

## 2015-12-31 DIAGNOSIS — I10 Essential (primary) hypertension: Secondary | ICD-10-CM | POA: Diagnosis not present

## 2015-12-31 DIAGNOSIS — Z79899 Other long term (current) drug therapy: Secondary | ICD-10-CM | POA: Insufficient documentation

## 2015-12-31 DIAGNOSIS — Z6834 Body mass index (BMI) 34.0-34.9, adult: Secondary | ICD-10-CM | POA: Insufficient documentation

## 2015-12-31 DIAGNOSIS — Z7982 Long term (current) use of aspirin: Secondary | ICD-10-CM | POA: Diagnosis not present

## 2015-12-31 DIAGNOSIS — I4819 Other persistent atrial fibrillation: Secondary | ICD-10-CM

## 2015-12-31 DIAGNOSIS — I130 Hypertensive heart and chronic kidney disease with heart failure and stage 1 through stage 4 chronic kidney disease, or unspecified chronic kidney disease: Secondary | ICD-10-CM | POA: Diagnosis not present

## 2015-12-31 DIAGNOSIS — Z8249 Family history of ischemic heart disease and other diseases of the circulatory system: Secondary | ICD-10-CM | POA: Diagnosis not present

## 2015-12-31 DIAGNOSIS — F419 Anxiety disorder, unspecified: Secondary | ICD-10-CM | POA: Insufficient documentation

## 2015-12-31 DIAGNOSIS — N183 Chronic kidney disease, stage 3 (moderate): Secondary | ICD-10-CM | POA: Diagnosis not present

## 2015-12-31 DIAGNOSIS — I5032 Chronic diastolic (congestive) heart failure: Secondary | ICD-10-CM | POA: Diagnosis not present

## 2015-12-31 DIAGNOSIS — I481 Persistent atrial fibrillation: Secondary | ICD-10-CM | POA: Diagnosis present

## 2015-12-31 DIAGNOSIS — Z794 Long term (current) use of insulin: Secondary | ICD-10-CM | POA: Diagnosis not present

## 2015-12-31 DIAGNOSIS — G20 Parkinson's disease: Secondary | ICD-10-CM | POA: Insufficient documentation

## 2015-12-31 DIAGNOSIS — J449 Chronic obstructive pulmonary disease, unspecified: Secondary | ICD-10-CM | POA: Diagnosis not present

## 2015-12-31 DIAGNOSIS — Z87891 Personal history of nicotine dependence: Secondary | ICD-10-CM | POA: Insufficient documentation

## 2015-12-31 DIAGNOSIS — E785 Hyperlipidemia, unspecified: Secondary | ICD-10-CM | POA: Diagnosis not present

## 2015-12-31 DIAGNOSIS — Z9119 Patient's noncompliance with other medical treatment and regimen: Secondary | ICD-10-CM | POA: Diagnosis not present

## 2015-12-31 DIAGNOSIS — Z841 Family history of disorders of kidney and ureter: Secondary | ICD-10-CM | POA: Diagnosis not present

## 2015-12-31 DIAGNOSIS — Z885 Allergy status to narcotic agent status: Secondary | ICD-10-CM | POA: Insufficient documentation

## 2015-12-31 DIAGNOSIS — I509 Heart failure, unspecified: Secondary | ICD-10-CM | POA: Diagnosis not present

## 2015-12-31 DIAGNOSIS — G4733 Obstructive sleep apnea (adult) (pediatric): Secondary | ICD-10-CM | POA: Insufficient documentation

## 2015-12-31 DIAGNOSIS — E1122 Type 2 diabetes mellitus with diabetic chronic kidney disease: Secondary | ICD-10-CM | POA: Insufficient documentation

## 2015-12-31 DIAGNOSIS — Z823 Family history of stroke: Secondary | ICD-10-CM | POA: Diagnosis not present

## 2015-12-31 DIAGNOSIS — Z7901 Long term (current) use of anticoagulants: Secondary | ICD-10-CM | POA: Insufficient documentation

## 2015-12-31 HISTORY — DX: Parkinson's disease: G20

## 2015-12-31 HISTORY — DX: Other persistent atrial fibrillation: I48.19

## 2015-12-31 HISTORY — DX: Parkinson's disease without dyskinesia, without mention of fluctuations: G20.A1

## 2015-12-31 HISTORY — DX: Chronic diastolic (congestive) heart failure: I50.32

## 2015-12-31 MED ORDER — AMIODARONE HCL 200 MG PO TABS: ORAL_TABLET | ORAL | 6 refills | Status: DC

## 2015-12-31 NOTE — Patient Instructions (Signed)
Your physician has recommended you make the following change in your medication:  1)Amiodarone 200mg  twice a day for 1 week then decrease to 200mg  daily  You have been referred to Dr. Ron Parker office for sleep apnea consultation

## 2015-12-31 NOTE — Progress Notes (Signed)
Primary Care Physician: Marco Collie [facility]) Primary Cardiologist: Dr Haroldine Laws Primary Electrophysiologist: Roye Gustafson Referring Physician: Bensimohon   Melissa Powers is a 70 y.o. female with a history of persistent atrial fibrillation who presents for consultation in the [facility].  The patient was initially diagnosed with atrial fibrillation 2 years after presenting with symptoms of palpitations, shortness of breath, and fatigue.  She reports that frequently her afib "comes and goes".  Episodes have increased in frequency and duration.  She has been on xarelto for 2 years.  She was started on amiodarone 6-7 months ago.  AF continues despite amiodarone.  She required cardioversion this past week.  She does not feel as well in her AF.  She reports having SOB during her AFib.  She is currently on high doses of amiodarone.  Today, she denies symptoms of chest pain,  orthopnea, PND, lower extremity edema, dizziness, presyncope, syncope,  bleeding, or neurologic sequela. The patient is tolerating medications without difficulties and is otherwise without complaint today.    Atrial Fibrillation Risk Factors:  she does have symptoms or diagnosis of sleep apnea. she is not compliant with CPAP therapy.  She says she cannot tolerate the mask.  she does not have a history of rheumatic fever.  she does not have a history of alcohol use.  she has a BMI of Body mass index is 34.51 kg/m.Marland Kitchen Filed Weights   12/31/15 1243  Weight: 194 lb 12.8 oz (88.4 kg)    LA size: 43   Atrial Fibrillation Management history:  Previous antiarrhythmic drugs: amiodarone  Previous cardioversions: 10/17  Previous ablations: none  CHADS2VASC score: 5  Anticoagulation history: xarelto   Past Medical History:  Diagnosis Date  . Anxiety   . Chronic kidney disease (CKD), stage III (moderate)   . COPD (chronic obstructive pulmonary disease) ([place])   . Diastolic dysfunction with  chronic heart failure ([place])   . Hyperlipidemia   . Hypertension   . Parkinson's disease ([place])    with deep brain stimulator  . Persistent atrial fibrillation ([place])   . Pneumonia 08/2015  . Sleep apnea    "tried mask; can't wear it" (10/19/2015)  . Type II diabetes mellitus ([place])    Past Surgical History:  Procedure Laterality Date  . ABDOMINAL HYSTERECTOMY    . CARDIOVERSION N/A 12/22/2015   Procedure: CARDIOVERSION;  Surgeon: Jolaine Artist, MD;  Location[facility] ENDOSCOPY;  Service: Cardiovascular;  Laterality: N/A;  . DEEP BRAIN STIMULATOR PLACEMENT  01/2015   for Parikinsons treatment  . TUBAL LIGATION      Current Outpatient Prescriptions  Medication Sig Dispense Refill  . amiodarone (PACERONE) 200 MG tablet Take 2 tablets (400 mg total) by mouth 2 (two) times daily. 120 tablet 6  . aspirin EC 81 MG EC tablet Take 1 tablet (81 mg total) by mouth daily.    Marland Kitchen atorvastatin (LIPITOR) 20 MG tablet Take 1 tablet (20 mg total) by mouth daily at 6 PM. 30 tablet 0  . carbidopa-levodopa (SINEMET IR) 25-100 MG per tablet Take 1 tablet by mouth 3 (three) times daily.    . cholecalciferol (VITAMIN D) 1000 units tablet Take 2,000 Units by mouth daily.    . citalopram (CELEXA) 20 MG tablet Take 20 mg by mouth daily.    Marland Kitchen gabapentin (NEURONTIN) 100 MG capsule Take 300 mg by mouth 3 (three) times daily.     . hydrALAZINE (APRESOLINE) 50 MG tablet Take 1 tablet (50 mg total) by  mouth 3 (three) times daily. 90 tablet 6  . insulin aspart (NOVOLOG) 100 UNIT/ML injection Inject 10 Units into the skin at bedtime.    . meclizine (ANTIVERT) 12.5 MG tablet Take 1 tablet (12.5 mg total) by mouth 2 (two) times daily as needed for dizziness. 30 tablet 0  . mupirocin ointment (BACTROBAN) 2 % Place 1 application into the nose 2 (two) times daily. 22 g 0  . Rivaroxaban (XARELTO) 15 MG TABS tablet Take 15 mg by mouth daily with supper.    Marland Kitchen rOPINIRole (REQUIP) 0.25 MG tablet Take 1 tablet (0.25 mg total) by  mouth 3 (three) times daily. 30 tablet 0  . torsemide (DEMADEX) 20 MG tablet Take 2 tablets (40 mg total) by mouth 2 (two) times daily. 150 tablet 3  . traZODone (DESYREL) 50 MG tablet Take 50 mg by mouth at bedtime.     . valsartan (DIOVAN) 320 MG tablet Take 320 mg by mouth daily.     No current facility-administered medications for this encounter.    Facility-Administered Medications Ordered in Other Encounters  Medication Dose Route Frequency Provider Last Rate Last Dose  . 0.9 %  sodium chloride infusion    Continuous PRN Neldon Newport, CRNA 0 mL/hr at 12/03/15 1156      Allergies  Allergen Reactions  . Morphine And Related Other (See Comments)    Pt reports intolerance due to sedation from morphine    Social History   Social History  . Marital status: Married    Spouse name: N/A  . Number of children: N/A  . Years of education: N/A   Occupational History  . Not on file.   Social History Main Topics  . Smoking status: Former Smoker    Quit date: 12/31/1983  . Smokeless tobacco: Never Used  . Alcohol use No  . Drug use: No  . Sexual activity: Not Currently   Other Topics Concern  . Not on file   Social History Narrative   Pt lives in [place] with spouse.  Retired Quarry manager.    Family History  Problem Relation Age of Onset  . Dementia Mother   . Heart Problems Father   . Stroke Sister   . Kidney failure Brother     on ESRD   The patient does not have a history of early familial atrial fibrillation or other arrhythmias.  ROS- All systems are reviewed and negative except as per the HPI above.  Physical Exam: Vitals:   12/31/15 1243  BP: (!) 112/48  Pulse: (!) 54  Weight: 194 lb 12.8 oz (88.4 kg)  Height: 5\' 3"  (1.6 m)    GEN- The patient is elderly appearing, alert and oriented x 3 today.   Head- normocephalic, atraumatic Eyes-  Sclera clear, conjunctiva pink Ears- hearing intact Oropharynx- clear Neck- supple  Lungs- Clear to ausculation  bilaterally, normal work of breathing Heart- Regular rate and rhythm, no murmurs, rubs or gallops  GI- soft, NT, ND, + BS Extremities- no clubbing, cyanosis, or edema MS- no significant deformity or atrophy Skin- no rash or lesion Psych- euthymic mood, full affect Neuro- + resting tremor from parkinsons  Wt Readings from Last 3 Encounters:  12/31/15 194 lb 12.8 oz (88.4 kg)  12/23/15 192 lb 4.8 oz (87.2 kg)  12/03/15 190 lb (86.2 kg)    EKG today demonstrates sinus bradycardia 54 bpm, septal infarct, QTc 506 msec Echo 8/17 demonstrated mild LVH, EF 55%, mild to moderaet MR, moderate LA enlargement  Epic  records are reviewed at length today  Assessment and Plan:  1. Persistent atrial fibrillation The patient has symptomatic atrial fibrillation.  Though previously paroxysmal, it appears to be progressing to persistent.  The patients CHAD2VASC score is 5.  she is  appropriately anticoagulated at this time with xarelto 15mg  daily (CrCl 42). Rate control has been very difficult.  The patients left atrial size is moderately enlarged.  Additional echo findings include moderate MR with preserved EF. A long discussion with the patient was had today regarding therapeutic strategies.  Extensive discussion of lifestyle modification including treatment of sleep apnea was also discussed.  Therapeutic strategies for afib including medicine and ablation were discussed in detail with the patient today. Risk, benefits, and alternatives to EP study and radiofrequency ablation for afib were also discussed in detail today. These risks include but are not limited to stroke, bleeding, vascular damage, tamponade, perforation, damage to the esophagus, lungs, and other structures, pulmonary vein stenosis, worsening renal function, and death. The patient understands these risk and wishes to proceed.  We will therefore proceed with catheter ablation at the next available time.  TEE will be required prior to  ablation to evaluate mitral regurgitation and exclude LAA thrombus. Risks of amiodarone discussed with the patient.  Reduce amiodarone to 200mg  BID x 1 week then 200mg  daily.  2. Morbid obesity As above, lifestyle modification was discussed at length including regular exercise and weight reduction.  3. Obstructive sleep apnea Not compliant with CPAP.  I will refer to Dr Ron Parker for other treatment options.  4. Chronic diastolic dysfunction Hopefully will improve with sinus rhythm long term.   Thompson Grayer, MD 12/31/2015 12:59 PM

## 2016-01-03 ENCOUNTER — Other Ambulatory Visit (HOSPITAL_COMMUNITY): Payer: Self-pay | Admitting: *Deleted

## 2016-01-03 DIAGNOSIS — I4819 Other persistent atrial fibrillation: Secondary | ICD-10-CM

## 2016-01-05 ENCOUNTER — Other Ambulatory Visit (HOSPITAL_COMMUNITY): Payer: Self-pay | Admitting: *Deleted

## 2016-01-05 ENCOUNTER — Encounter (HOSPITAL_COMMUNITY): Payer: Self-pay

## 2016-01-05 ENCOUNTER — Ambulatory Visit (HOSPITAL_COMMUNITY)
Admit: 2016-01-05 | Discharge: 2016-01-05 | Disposition: A | Discharge status: Home / Self Care | Payer: Medicare (Managed Care) | Attending: Cardiology | Admitting: Cardiology

## 2016-01-05 ENCOUNTER — Telehealth (HOSPITAL_COMMUNITY): Payer: Self-pay | Admitting: *Deleted

## 2016-01-05 VITALS — BP 134/64 | HR 77 | Wt 194.0 lb

## 2016-01-05 DIAGNOSIS — I481 Persistent atrial fibrillation: Secondary | ICD-10-CM | POA: Diagnosis not present

## 2016-01-05 DIAGNOSIS — E1122 Type 2 diabetes mellitus with diabetic chronic kidney disease: Secondary | ICD-10-CM | POA: Insufficient documentation

## 2016-01-05 DIAGNOSIS — Z794 Long term (current) use of insulin: Secondary | ICD-10-CM | POA: Insufficient documentation

## 2016-01-05 DIAGNOSIS — F419 Anxiety disorder, unspecified: Secondary | ICD-10-CM | POA: Insufficient documentation

## 2016-01-05 DIAGNOSIS — I10 Essential (primary) hypertension: Secondary | ICD-10-CM | POA: Diagnosis not present

## 2016-01-05 DIAGNOSIS — E1140 Type 2 diabetes mellitus with diabetic neuropathy, unspecified: Secondary | ICD-10-CM

## 2016-01-05 DIAGNOSIS — Z87891 Personal history of nicotine dependence: Secondary | ICD-10-CM | POA: Diagnosis not present

## 2016-01-05 DIAGNOSIS — R42 Dizziness and giddiness: Secondary | ICD-10-CM | POA: Insufficient documentation

## 2016-01-05 DIAGNOSIS — I5033 Acute on chronic diastolic (congestive) heart failure: Secondary | ICD-10-CM | POA: Diagnosis not present

## 2016-01-05 DIAGNOSIS — N184 Chronic kidney disease, stage 4 (severe): Secondary | ICD-10-CM | POA: Insufficient documentation

## 2016-01-05 DIAGNOSIS — J9610 Chronic respiratory failure, unspecified whether with hypoxia or hypercapnia: Secondary | ICD-10-CM | POA: Insufficient documentation

## 2016-01-05 DIAGNOSIS — I4819 Other persistent atrial fibrillation: Secondary | ICD-10-CM

## 2016-01-05 DIAGNOSIS — I5032 Chronic diastolic (congestive) heart failure: Secondary | ICD-10-CM | POA: Insufficient documentation

## 2016-01-05 DIAGNOSIS — I130 Hypertensive heart and chronic kidney disease with heart failure and stage 1 through stage 4 chronic kidney disease, or unspecified chronic kidney disease: Secondary | ICD-10-CM | POA: Diagnosis not present

## 2016-01-05 DIAGNOSIS — Z79899 Other long term (current) drug therapy: Secondary | ICD-10-CM | POA: Diagnosis not present

## 2016-01-05 DIAGNOSIS — G20 Parkinson's disease: Secondary | ICD-10-CM | POA: Diagnosis not present

## 2016-01-05 DIAGNOSIS — R002 Palpitations: Secondary | ICD-10-CM | POA: Diagnosis not present

## 2016-01-05 DIAGNOSIS — I480 Paroxysmal atrial fibrillation: Secondary | ICD-10-CM | POA: Insufficient documentation

## 2016-01-05 DIAGNOSIS — Z888 Allergy status to other drugs, medicaments and biological substances status: Secondary | ICD-10-CM | POA: Diagnosis not present

## 2016-01-05 DIAGNOSIS — J449 Chronic obstructive pulmonary disease, unspecified: Secondary | ICD-10-CM | POA: Insufficient documentation

## 2016-01-05 DIAGNOSIS — G4730 Sleep apnea, unspecified: Secondary | ICD-10-CM | POA: Insufficient documentation

## 2016-01-05 DIAGNOSIS — E785 Hyperlipidemia, unspecified: Secondary | ICD-10-CM | POA: Insufficient documentation

## 2016-01-05 LAB — BASIC METABOLIC PANEL
Anion gap: 8 (ref 5–15)
BUN: 44 mg/dL — AB (ref 6–20)
CALCIUM: 9.9 mg/dL (ref 8.9–10.3)
CHLORIDE: 100 mmol/L — AB (ref 101–111)
CO2: 30 mmol/L (ref 22–32)
CREATININE: 1.63 mg/dL — AB (ref 0.44–1.00)
GFR calc non Af Amer: 31 mL/min — ABNORMAL LOW (ref >60)
GFR, EST AFRICAN AMERICAN: 36 mL/min — AB (ref >60)
GLUCOSE: 142 mg/dL — AB (ref 65–99)
Potassium: 4.6 mmol/L (ref 3.5–5.1)
Sodium: 138 mmol/L (ref 135–145)

## 2016-01-05 LAB — BRAIN NATRIURETIC PEPTIDE: B Natriuretic Peptide: 158.2 pg/mL — ABNORMAL HIGH (ref 0.0–100.0)

## 2016-01-05 MED ORDER — POTASSIUM CHLORIDE CRYS ER 20 MEQ PO TBCR: 20.0000 meq | EXTENDED_RELEASE_TABLET | ORAL | 3 refills | Status: DC

## 2016-01-05 MED ORDER — METOLAZONE 2.5 MG PO TABS: 2.5000 mg | ORAL_TABLET | ORAL | 3 refills | Status: DC

## 2016-01-05 NOTE — Progress Notes (Signed)
Advanced Heart Failure Clinic Note   Referring Physician: Dr. Ree Kida Primary Physician: Dr. Salvadore Oxford Primary Cardiologist:  Dr. Clayburn Pert HF: Dr. Haroldine Laws   HPI:  Melissa Powers is a 70 y.o.  female with PMH of HFpEF, Afib on anticoagulation, CKD Stage 3, T2DM, HTN, HLD, and Parkinson's disease.  She had previously followed with Cardiology in [place] and was a part of a ReDS Vest Program with Dr. Haroldine Laws providing direction as well.  Admitted 8/1 -10/22/15 with worsening DOE and generalized weakness despite adjustment of outpatient diuretics.  CXR with CHF with mild interstitial edema. She was treated with IV diuretics with resolution of symptoms. Discharge weight 182 lbs. VEST reading on d/c 43% suggesting residual volume overload.   She returns today for post hospital follow up.  Weight at home 192 - 195. (Previously around 188 at home in September). Up 6 lbs by our scales. Feeling more SOB. Continues to have Class IIIB symptoms (with gettings dressed, bathing, getting around house). Watching fluid and salt.  Tries to only eat food that is < 5% of daily recommended sodium per serving.  Has palpitations several times a day.  Says her HR occasionally gets up 100-105, and she can feel it. No bleeding on Xarelto. No BRBPR or Melena. She saw Dr Rayann Heman last week and is scheduled for Afib Ablation 02/04/16.  Echo 10/20/15 with LVEF 55-60%, mild/mod MR, Mod LAE, mild RVH, mild RAE, PA peak pressure 69 mm Hg  Past Medical History:  Diagnosis Date  . Anxiety   . Chronic kidney disease (CKD), stage III (moderate)   . COPD (chronic obstructive pulmonary disease) ([place])   . Diastolic dysfunction with chronic heart failure ([place])   . Hyperlipidemia   . Hypertension   . Parkinson's disease ([place])    with deep brain stimulator  . Persistent atrial fibrillation ([place])   . Pneumonia 08/2015  . Sleep apnea    "tried mask; can't wear it" (10/19/2015)  . Type II diabetes mellitus ([place])      Current Outpatient Prescriptions  Medication Sig Dispense Refill  . amiodarone (PACERONE) 200 MG tablet Take 200 mg by mouth daily.    Marland Kitchen atorvastatin (LIPITOR) 20 MG tablet Take 10 mg by mouth daily.    Marland Kitchen gabapentin (NEURONTIN) 100 MG capsule Take 100 mg by mouth daily.    . carbidopa-levodopa (SINEMET IR) 25-100 MG per tablet Take 1 tablet by mouth 3 (three) times daily.    . cholecalciferol (VITAMIN D) 1000 units tablet Take 2,000 Units by mouth daily.    . citalopram (CELEXA) 20 MG tablet Take 20 mg by mouth daily.    . hydrALAZINE (APRESOLINE) 50 MG tablet Take 1 tablet (50 mg total) by mouth 3 (three) times daily. 90 tablet 6  . insulin aspart (NOVOLOG) 100 UNIT/ML injection Inject 10 Units into the skin at bedtime.    . meclizine (ANTIVERT) 12.5 MG tablet Take 1 tablet (12.5 mg total) by mouth 2 (two) times daily as needed for dizziness. 30 tablet 0  . mupirocin ointment (BACTROBAN) 2 % Place 1 application into the nose 2 (two) times daily. 22 g 0  . Rivaroxaban (XARELTO) 15 MG TABS tablet Take 15 mg by mouth daily with supper.    Marland Kitchen rOPINIRole (REQUIP) 0.25 MG tablet Take 1 tablet (0.25 mg total) by mouth 3 (three) times daily. 30 tablet 0  . torsemide (DEMADEX) 20 MG tablet Take 2 tablets (40 mg total) by mouth 2 (two) times daily. 150 tablet 3  .  traZODone (DESYREL) 50 MG tablet Take 50 mg by mouth at bedtime.     . valsartan (DIOVAN) 320 MG tablet Take 320 mg by mouth daily.     No current facility-administered medications for this encounter.    Facility-Administered Medications Ordered in Other Encounters  Medication Dose Route Frequency Provider Last Rate Last Dose  . 0.9 %  sodium chloride infusion    Continuous PRN Neldon Newport, CRNA 0 mL/hr at 12/03/15 1156      Allergies  Allergen Reactions  . Morphine And Related Other (See Comments)    Pt reports intolerance due to sedation from morphine      Social History   Social History  . Marital status: Married     Spouse name: N/A  . Number of children: N/A  . Years of education: N/A   Occupational History  . Not on file.   Social History Main Topics  . Smoking status: Former Smoker    Quit date: 12/31/1983  . Smokeless tobacco: Never Used  . Alcohol use No  . Drug use: No  . Sexual activity: Not Currently   Other Topics Concern  . Not on file   Social History Narrative   Pt lives in [place] with spouse.  Retired Quarry manager.    Vitals:   01/05/16 1113  BP: 134/64  Pulse: 77  SpO2: 98%  Weight: 194 lb (88 kg)   Wt Readings from Last 3 Encounters:  01/05/16 194 lb (88 kg)  12/31/15 194 lb 12.8 oz (88.4 kg)  12/23/15 192 lb 4.8 oz (87.2 kg)     PHYSICAL EXAM: General:  Elderly appearing, resting tremor HEENT: normal Neck: supple. JVD appears 8-9 cm. Carotids 2+ bilat; no bruits. No thyromegaly or nodule noted.  Cor: PMI nondisplaced. RRR, slightly brady. No M/G/R noted.  Lungs: Clear, normal effort Abdomen: soft, NT, ND, no HSM. No bruits or masses. +BS  Extremities: no cyanosis, clubbing, rash. Trace ankle edema.  Neuro: alert & oriented x 3, cranial nerves grossly intact. moves all 4 extremities w/o difficulty. Affect pleasant.    EKG 67 bpm unusual P axis, possible ectopic atrial rhythm with premature atrial complexes  Assessment   1. Chronic diastolic CHF - Echo 123456 LVEF 55-60% 2. Chronic respiratory failure 3. Paroxysmal Afib 4. 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 HTN 7. HLD 8. Anxiety 9. Parkinsons with falls and weakness.   Plan    Restricting fluids better and watching diet.   ReDS vest reading 34%.  Still feeling more SOB, weight also up 6 lbs from last clinic visit.   Cotninue torsemide 40/40 mg BID. BMET/BNP today.   Discussed with Dr. Haroldine Laws. Will add 2.5 mg metolazone and 40 meq of potassium every Thursday.    She states vertigo symptoms have improved. Still gets occasionally lightheaded with standing. Will titrate diuretics carefully.     Pt knows to call the clinic with worsening symptoms or weight gain of 3 lbs overnight or 5 lbs within one week.   She is scheduled for Afib Ablation 02/04/16. Suspect she has very poor tolerance for being in Afib, and hope that this will help her symptoms. EKG today with "unusual P axis, possible ectopic atrial rhythm with premature atrial complexes"  Follow up in 2 weeks to reassess volume status and symptoms.  Follow up in 6 weeks with Dr. Haroldine Laws (after ablation)  Shirley Friar, PA-C 01/05/16   Total time spent > 25 minutes. Over half that spent  discussing the above.

## 2016-01-05 NOTE — Telephone Encounter (Signed)
Called pt to set up referral to Dr. Ron Parker for mouthpiece for sleep apnea and pt stated she is involved with [facility] and they will refer to their dentist. Instructed pt to let me know if they were unable to provide the necessary equipment for sleep apnea treatment and I will send in referral for Dr. Ron Parker. Pt verbalized understanding.

## 2016-01-05 NOTE — Progress Notes (Signed)
REDS VEST READING=34 CHEST RULER=15 & 1/2    VEST FITTING TASKS: POSTURE=standing  HEIGHT MARKER=short CENTER STRIP=aligned  COMMENTS: Last chest ruler was 18. Patient has gained 6lbs from last visit and could not fit an 18 marker.

## 2016-01-05 NOTE — Patient Instructions (Signed)
Start Metolazone 2.5 mg every Thursday  Start Potassium 20 meq every Thursday  Labs today  Your physician recommends that you schedule a follow-up appointment in: 2 weeks with Jonni Sanger, [place]  Your physician recommends that you schedule a follow-up appointment in: 6 weeks with Dr Haroldine Laws

## 2016-01-19 ENCOUNTER — Ambulatory Visit (HOSPITAL_COMMUNITY)
Admission: RE | Admit: 2016-01-19 | Discharge: 2016-01-19 | Disposition: A | Discharge status: Home / Self Care | Payer: Medicare (Managed Care) | Source: Ambulatory Visit | Attending: Cardiology | Admitting: Cardiology

## 2016-01-19 VITALS — BP 144/70 | HR 68 | Wt 192.6 lb

## 2016-01-19 DIAGNOSIS — I5032 Chronic diastolic (congestive) heart failure: Secondary | ICD-10-CM | POA: Diagnosis present

## 2016-01-19 DIAGNOSIS — I481 Persistent atrial fibrillation: Secondary | ICD-10-CM | POA: Insufficient documentation

## 2016-01-19 DIAGNOSIS — I130 Hypertensive heart and chronic kidney disease with heart failure and stage 1 through stage 4 chronic kidney disease, or unspecified chronic kidney disease: Secondary | ICD-10-CM | POA: Insufficient documentation

## 2016-01-19 DIAGNOSIS — G20 Parkinson's disease: Secondary | ICD-10-CM | POA: Diagnosis not present

## 2016-01-19 DIAGNOSIS — J9610 Chronic respiratory failure, unspecified whether with hypoxia or hypercapnia: Secondary | ICD-10-CM | POA: Diagnosis not present

## 2016-01-19 DIAGNOSIS — R531 Weakness: Secondary | ICD-10-CM | POA: Diagnosis not present

## 2016-01-19 DIAGNOSIS — I480 Paroxysmal atrial fibrillation: Secondary | ICD-10-CM | POA: Insufficient documentation

## 2016-01-19 DIAGNOSIS — E1122 Type 2 diabetes mellitus with diabetic chronic kidney disease: Secondary | ICD-10-CM | POA: Insufficient documentation

## 2016-01-19 DIAGNOSIS — I10 Essential (primary) hypertension: Secondary | ICD-10-CM | POA: Diagnosis not present

## 2016-01-19 DIAGNOSIS — Z79899 Other long term (current) drug therapy: Secondary | ICD-10-CM | POA: Diagnosis not present

## 2016-01-19 DIAGNOSIS — Z888 Allergy status to other drugs, medicaments and biological substances status: Secondary | ICD-10-CM | POA: Insufficient documentation

## 2016-01-19 DIAGNOSIS — G4730 Sleep apnea, unspecified: Secondary | ICD-10-CM | POA: Insufficient documentation

## 2016-01-19 DIAGNOSIS — Z794 Long term (current) use of insulin: Secondary | ICD-10-CM | POA: Diagnosis not present

## 2016-01-19 DIAGNOSIS — F419 Anxiety disorder, unspecified: Secondary | ICD-10-CM | POA: Insufficient documentation

## 2016-01-19 DIAGNOSIS — N184 Chronic kidney disease, stage 4 (severe): Secondary | ICD-10-CM | POA: Diagnosis not present

## 2016-01-19 DIAGNOSIS — Z87891 Personal history of nicotine dependence: Secondary | ICD-10-CM | POA: Insufficient documentation

## 2016-01-19 DIAGNOSIS — E785 Hyperlipidemia, unspecified: Secondary | ICD-10-CM | POA: Diagnosis not present

## 2016-01-19 DIAGNOSIS — E1140 Type 2 diabetes mellitus with diabetic neuropathy, unspecified: Secondary | ICD-10-CM

## 2016-01-19 DIAGNOSIS — J449 Chronic obstructive pulmonary disease, unspecified: Secondary | ICD-10-CM | POA: Diagnosis not present

## 2016-01-19 LAB — BASIC METABOLIC PANEL
ANION GAP: 12 (ref 5–15)
BUN: 73 mg/dL — AB (ref 6–20)
CALCIUM: 9.9 mg/dL (ref 8.9–10.3)
CO2: 30 mmol/L (ref 22–32)
CREATININE: 2.2 mg/dL — AB (ref 0.44–1.00)
Chloride: 95 mmol/L — ABNORMAL LOW (ref 101–111)
GFR calc Af Amer: 25 mL/min — ABNORMAL LOW (ref >60)
GFR, EST NON AFRICAN AMERICAN: 21 mL/min — AB (ref >60)
GLUCOSE: 175 mg/dL — AB (ref 65–99)
Potassium: 4 mmol/L (ref 3.5–5.1)
Sodium: 137 mmol/L (ref 135–145)

## 2016-01-19 LAB — BRAIN NATRIURETIC PEPTIDE: B Natriuretic Peptide: 115.2 pg/mL — ABNORMAL HIGH (ref 0.0–100.0)

## 2016-01-19 NOTE — Progress Notes (Signed)
Advanced Heart Failure Clinic Note   Referring Physician: Dr. Ree Kida Primary Physician: Dr. Salvadore Oxford Primary Cardiologist:  Dr. Clayburn Pert HF: Dr. Haroldine Laws   HPI:  Melissa Powers is a 70 y.o.  female with PMH of HFpEF, Afib on anticoagulation, CKD Stage 3, T2DM, HTN, HLD, and Parkinson's disease.  She had previously followed with Cardiology in [place] and was a part of a ReDS Vest Program with Dr. Haroldine Laws providing direction as well.  Admitted 8/1 -10/22/15 with worsening DOE and generalized weakness despite adjustment of outpatient diuretics.  CXR with CHF with mild interstitial edema. She was treated with IV diuretics with resolution of symptoms. Discharge weight 182 lbs. VEST reading on d/c 43% suggesting residual volume overload.   She returns today for regular follow up. At last visit metolazone added with volume overloaded. Has been feeling better. Weight at home down to 189 (from 192-195)   Weight at home 192 - 195. She is still SOB bathing, showering, or walking short distances. Watching fluid or salt. Hasn't been feeling as many palpitations over the past week. Denies bleeding on Xarelto. Does bruise easy.  Scheduled for  Afib Ablation 02/04/16 with Dr. Rayann Heman.  Echo 10/20/15 with LVEF 55-60%, mild/mod MR, Mod LAE, mild RVH, mild RAE, PA peak pressure 69 mm Hg  Past Medical History:  Diagnosis Date  . Anxiety   . Chronic kidney disease (CKD), stage III (moderate)   . COPD (chronic obstructive pulmonary disease) ([place])   . Diastolic dysfunction with chronic heart failure ([place])   . Hyperlipidemia   . Hypertension   . Parkinson's disease ([place])    with deep brain stimulator  . Persistent atrial fibrillation ([place])   . Pneumonia 08/2015  . Sleep apnea    "tried mask; can't wear it" (10/19/2015)  . Type II diabetes mellitus ([place])     Current Outpatient Prescriptions  Medication Sig Dispense Refill  . amiodarone (PACERONE) 200 MG tablet Take 200 mg by mouth daily.    Marland Kitchen  atorvastatin (LIPITOR) 20 MG tablet Take 10 mg by mouth daily.    . carbidopa-levodopa (SINEMET IR) 25-100 MG per tablet Take 1 tablet by mouth 3 (three) times daily.    . cholecalciferol (VITAMIN D) 1000 units tablet Take 2,000 Units by mouth daily.    . citalopram (CELEXA) 20 MG tablet Take 20 mg by mouth daily.    Marland Kitchen gabapentin (NEURONTIN) 100 MG capsule Take 100 mg by mouth daily.    . hydrALAZINE (APRESOLINE) 50 MG tablet Take 1 tablet (50 mg total) by mouth 3 (three) times daily. 90 tablet 6  . insulin aspart (NOVOLOG) 100 UNIT/ML injection Inject 10 Units into the skin at bedtime.    . meclizine (ANTIVERT) 12.5 MG tablet Take 1 tablet (12.5 mg total) by mouth 2 (two) times daily as needed for dizziness. 30 tablet 0  . metolazone (ZAROXOLYN) 2.5 MG tablet Take 1 tablet (2.5 mg total) by mouth once a week. On Thursday 5 tablet 3  . mupirocin ointment (BACTROBAN) 2 % Place 1 application into the nose 2 (two) times daily. 22 g 0  . potassium chloride SA (K-DUR,KLOR-CON) 20 MEQ tablet Take 1 tablet (20 mEq total) by mouth once a week. On Thursday with Metolazone 5 tablet 3  . Rivaroxaban (XARELTO) 15 MG TABS tablet Take 15 mg by mouth daily with supper.    Marland Kitchen rOPINIRole (REQUIP) 0.25 MG tablet Take 1 tablet (0.25 mg total) by mouth 3 (three) times daily. 30 tablet 0  .  torsemide (DEMADEX) 20 MG tablet Take 2 tablets (40 mg total) by mouth 2 (two) times daily. 150 tablet 3  . traZODone (DESYREL) 50 MG tablet Take 50 mg by mouth at bedtime.     . valsartan (DIOVAN) 320 MG tablet Take 320 mg by mouth daily.     No current facility-administered medications for this encounter.    Facility-Administered Medications Ordered in Other Encounters  Medication Dose Route Frequency Provider Last Rate Last Dose  . 0.9 %  sodium chloride infusion    Continuous PRN Neldon Newport, CRNA 0 mL/hr at 12/03/15 1156      Allergies  Allergen Reactions  . Morphine And Related Other (See Comments)    Pt reports  intolerance due to sedation from morphine      Social History   Social History  . Marital status: Married    Spouse name: N/A  . Number of children: N/A  . Years of education: N/A   Occupational History  . Not on file.   Social History Main Topics  . Smoking status: Former Smoker    Quit date: 12/31/1983  . Smokeless tobacco: Never Used  . Alcohol use No  . Drug use: No  . Sexual activity: Not Currently   Other Topics Concern  . Not on file   Social History Narrative   Pt lives in [place] with spouse.  Retired Quarry manager.    Vitals:   01/19/16 1344  BP: (!) 144/70  Pulse: 68  SpO2: 99%  Weight: 192 lb 9.6 oz (87.4 kg)   Wt Readings from Last 3 Encounters:  01/19/16 192 lb 9.6 oz (87.4 kg)  01/05/16 194 lb (88 kg)  12/31/15 194 lb 12.8 oz (88.4 kg)     PHYSICAL EXAM: General:  Elderly appearing, resting tremor HEENT: normal Neck: supple. JVD ~7-8 cm,  Carotids 2+ bilat; no bruits. No thyromegaly or nodule noted.  Cor: PMI nondisplaced. Regular, slightly brady. No M/G/R appreciated.  Lungs: CTAB, normal effort Abdomen: soft, NT, ND, no HSM. No bruits or masses. +BS  Extremities: no cyanosis, clubbing, rash. Trace ankle edema.  Neuro: alert & oriented x 3, cranial nerves grossly intact. moves all 4 extremities w/o difficulty. Affect pleasant.    EKG 65 bpm, low voltage QRS.   Assessment   1. Chronic diastolic CHF - Echo 123456 LVEF 55-60% 2. Chronic respiratory failure 3. Paroxysmal Afib 4. 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 HTN 7. HLD 8. Anxiety 9. Parkinsons with falls and weakness.  Plan    Volume status stable. Feeling much better with addition of metolazone. Feeling less palpitations. Appears to be back in NSR by EKG today.   Continue plan for AF ablation 02/04/16 with frequent recurrence and poor tolerance.   Continue  torsemide 40/40 mg BID with 2.5 mg metolazone and 40 meq of potassium every Thursday. BMET/BNP today. Pt knows to call  the clinic with worsening symptoms or weight gain of 3 lbs overnight or 5 lbs within one week.   Follow up in 4 weeks with Dr. Haroldine Laws (after ablation)  Shirley Friar, PA-C 01/19/16

## 2016-01-19 NOTE — Patient Instructions (Signed)
EKG today.  Routine lab work today. Will notify you of abnormal results, otherwise no news is good news!  Follow up as scheduled with Dr. Haroldine Laws.  Do the following things EVERYDAY: 1) Weigh yourself in the morning before breakfast. Write it down and keep it in a log. 2) Take your medicines as prescribed 3) Eat low salt foods-Limit salt (sodium) to 2000 mg per day.  4) Stay as active as you can everyday 5) Limit all fluids for the day to less than 2 liters.

## 2016-01-26 ENCOUNTER — Encounter: Payer: Self-pay | Admitting: Cardiovascular Disease

## 2016-01-26 ENCOUNTER — Encounter (HOSPITAL_COMMUNITY): Payer: Self-pay | Admitting: Cardiology

## 2016-01-31 ENCOUNTER — Encounter: Payer: Self-pay | Admitting: Internal Medicine

## 2016-02-01 ENCOUNTER — Telehealth (HOSPITAL_COMMUNITY): Payer: Self-pay | Admitting: *Deleted

## 2016-02-01 NOTE — Telephone Encounter (Signed)
Patient had lab work for upcoming ablation at [facility] in [place]. Labs were drawn on 01/26/16 results below.  CBC--- WBC 9.0 RBC 3.99 HGB 11.0 Hematocrit 33.3 MCV 84 MCH 27.6 MCHC 33.1 RDW 15.2 Platelet count 209 MPV 8.3 Neutr 77.4 Lymph 14.5 Mono 6.4 EOS 0.9 Baso 0.8 ANC 6.93 ALC 1.29  BMET--- Sodium - 139 Potassium 4.2 Chloride 96 CO2 32 Glucose 143 BUN 61 Creatinine 2.10 eGFR 23 Anion Gap 15 Calcium 9.7 Osmolality 287  Will request labs be scanned into EPIC as well. Will route labs to Dr. Rayann Heman for review.

## 2016-02-03 ENCOUNTER — Encounter (HOSPITAL_COMMUNITY): Payer: Self-pay | Admitting: *Deleted

## 2016-02-03 ENCOUNTER — Encounter (HOSPITAL_COMMUNITY)
Admission: RE | Disposition: A | Discharge status: Home / Self Care | Payer: Self-pay | Source: Ambulatory Visit | Attending: Cardiology

## 2016-02-03 ENCOUNTER — Ambulatory Visit (HOSPITAL_COMMUNITY)
Admission: RE | Admit: 2016-02-03 | Payer: Medicare (Managed Care) | Source: Ambulatory Visit | Admitting: Internal Medicine

## 2016-02-03 ENCOUNTER — Telehealth (HOSPITAL_COMMUNITY): Payer: Self-pay | Admitting: *Deleted

## 2016-02-03 ENCOUNTER — Other Ambulatory Visit: Payer: Self-pay

## 2016-02-03 ENCOUNTER — Ambulatory Visit (HOSPITAL_COMMUNITY): Payer: Medicare (Managed Care) | Admitting: Anesthesiology

## 2016-02-03 ENCOUNTER — Ambulatory Visit (HOSPITAL_COMMUNITY)
Admission: RE | Admit: 2016-02-03 | Discharge: 2016-02-04 | Disposition: A | Discharge status: Home / Self Care | Payer: Medicare (Managed Care) | Source: Ambulatory Visit | Attending: Cardiology | Admitting: Cardiology

## 2016-02-03 ENCOUNTER — Ambulatory Visit (HOSPITAL_BASED_OUTPATIENT_CLINIC_OR_DEPARTMENT_OTHER): Payer: Medicare (Managed Care)

## 2016-02-03 DIAGNOSIS — Z794 Long term (current) use of insulin: Secondary | ICD-10-CM | POA: Diagnosis not present

## 2016-02-03 DIAGNOSIS — Z87891 Personal history of nicotine dependence: Secondary | ICD-10-CM | POA: Insufficient documentation

## 2016-02-03 DIAGNOSIS — Z6833 Body mass index (BMI) 33.0-33.9, adult: Secondary | ICD-10-CM | POA: Insufficient documentation

## 2016-02-03 DIAGNOSIS — J449 Chronic obstructive pulmonary disease, unspecified: Secondary | ICD-10-CM | POA: Diagnosis not present

## 2016-02-03 DIAGNOSIS — Z9119 Patient's noncompliance with other medical treatment and regimen: Secondary | ICD-10-CM | POA: Insufficient documentation

## 2016-02-03 DIAGNOSIS — N183 Chronic kidney disease, stage 3 (moderate): Secondary | ICD-10-CM | POA: Insufficient documentation

## 2016-02-03 DIAGNOSIS — G20 Parkinson's disease: Secondary | ICD-10-CM | POA: Insufficient documentation

## 2016-02-03 DIAGNOSIS — Z7901 Long term (current) use of anticoagulants: Secondary | ICD-10-CM | POA: Diagnosis not present

## 2016-02-03 DIAGNOSIS — Z885 Allergy status to narcotic agent status: Secondary | ICD-10-CM | POA: Diagnosis not present

## 2016-02-03 DIAGNOSIS — I5032 Chronic diastolic (congestive) heart failure: Secondary | ICD-10-CM | POA: Diagnosis not present

## 2016-02-03 DIAGNOSIS — Z7982 Long term (current) use of aspirin: Secondary | ICD-10-CM | POA: Diagnosis not present

## 2016-02-03 DIAGNOSIS — F419 Anxiety disorder, unspecified: Secondary | ICD-10-CM | POA: Diagnosis not present

## 2016-02-03 DIAGNOSIS — I4819 Other persistent atrial fibrillation: Secondary | ICD-10-CM

## 2016-02-03 DIAGNOSIS — I4891 Unspecified atrial fibrillation: Secondary | ICD-10-CM | POA: Diagnosis not present

## 2016-02-03 DIAGNOSIS — E785 Hyperlipidemia, unspecified: Secondary | ICD-10-CM | POA: Diagnosis not present

## 2016-02-03 DIAGNOSIS — Z79899 Other long term (current) drug therapy: Secondary | ICD-10-CM | POA: Diagnosis not present

## 2016-02-03 DIAGNOSIS — E1122 Type 2 diabetes mellitus with diabetic chronic kidney disease: Secondary | ICD-10-CM | POA: Diagnosis not present

## 2016-02-03 DIAGNOSIS — I130 Hypertensive heart and chronic kidney disease with heart failure and stage 1 through stage 4 chronic kidney disease, or unspecified chronic kidney disease: Secondary | ICD-10-CM | POA: Diagnosis not present

## 2016-02-03 DIAGNOSIS — I481 Persistent atrial fibrillation: Secondary | ICD-10-CM | POA: Diagnosis not present

## 2016-02-03 DIAGNOSIS — I480 Paroxysmal atrial fibrillation: Secondary | ICD-10-CM | POA: Diagnosis present

## 2016-02-03 DIAGNOSIS — G4733 Obstructive sleep apnea (adult) (pediatric): Secondary | ICD-10-CM | POA: Diagnosis not present

## 2016-02-03 HISTORY — PX: ELECTROPHYSIOLOGIC STUDY: SHX172A

## 2016-02-03 HISTORY — PX: TEE WITHOUT CARDIOVERSION: SHX5443

## 2016-02-03 LAB — CBC
HEMATOCRIT: 33.5 % — AB (ref 36.0–46.0)
HEMOGLOBIN: 10.5 g/dL — AB (ref 12.0–15.0)
MCH: 26.9 pg (ref 26.0–34.0)
MCHC: 31.3 g/dL (ref 30.0–36.0)
MCV: 85.9 fL (ref 78.0–100.0)
Platelets: 216 10*3/uL (ref 150–400)
RBC: 3.9 MIL/uL (ref 3.87–5.11)
RDW: 15 % (ref 11.5–15.5)
WBC: 6.9 10*3/uL (ref 4.0–10.5)

## 2016-02-03 LAB — BASIC METABOLIC PANEL
ANION GAP: 11 (ref 5–15)
BUN: 46 mg/dL — AB (ref 6–20)
CO2: 28 mmol/L (ref 22–32)
Calcium: 9.9 mg/dL (ref 8.9–10.3)
Chloride: 99 mmol/L — ABNORMAL LOW (ref 101–111)
Creatinine, Ser: 1.87 mg/dL — ABNORMAL HIGH (ref 0.44–1.00)
GFR calc Af Amer: 30 mL/min — ABNORMAL LOW (ref >60)
GFR, EST NON AFRICAN AMERICAN: 26 mL/min — AB (ref >60)
Glucose, Bld: 141 mg/dL — ABNORMAL HIGH (ref 65–99)
POTASSIUM: 4.2 mmol/L (ref 3.5–5.1)
SODIUM: 138 mmol/L (ref 135–145)

## 2016-02-03 LAB — SURGICAL PCR SCREEN
MRSA, PCR: NEGATIVE
Staphylococcus aureus: NEGATIVE

## 2016-02-03 LAB — POCT ACTIVATED CLOTTING TIME
ACTIVATED CLOTTING TIME: 285 s
ACTIVATED CLOTTING TIME: 335 s
ACTIVATED CLOTTING TIME: 186 s
Activated Clotting Time: 279 seconds

## 2016-02-03 LAB — GLUCOSE, CAPILLARY
GLUCOSE-CAPILLARY: 121 mg/dL — AB (ref 65–99)
Glucose-Capillary: 143 mg/dL — ABNORMAL HIGH (ref 65–99)

## 2016-02-03 SURGERY — ATRIAL FIBRILLATION ABLATION
Anesthesia: General

## 2016-02-03 SURGERY — ECHOCARDIOGRAM, TRANSESOPHAGEAL
Anesthesia: Moderate Sedation

## 2016-02-03 MED ORDER — HEPARIN SODIUM (PORCINE) 1000 UNIT/ML IJ SOLN
INTRAMUSCULAR | Status: DC | PRN
  Administered 2016-02-03: 3000 [IU] via INTRAVENOUS
  Administered 2016-02-03: 12000 [IU] via INTRAVENOUS
  Administered 2016-02-03: 4000 [IU] via INTRAVENOUS

## 2016-02-03 MED ORDER — BUTAMBEN-TETRACAINE-BENZOCAINE 2-2-14 % EX AERO
INHALATION_SPRAY | CUTANEOUS | Status: DC | PRN
  Administered 2016-02-03: 2 via TOPICAL

## 2016-02-03 MED ORDER — HYDRALAZINE HCL 50 MG PO TABS: 50.0000 mg | ORAL_TABLET | Freq: Three times a day (TID) | ORAL | Status: DC

## 2016-02-03 MED ORDER — ROCURONIUM BROMIDE 10 MG/ML (PF) SYRINGE
PREFILLED_SYRINGE | INTRAVENOUS | Status: DC | PRN
  Administered 2016-02-03: 20 mg via INTRAVENOUS
  Administered 2016-02-03: 50 mg via INTRAVENOUS
  Administered 2016-02-03: 30 mg via INTRAVENOUS

## 2016-02-03 MED ORDER — FENTANYL CITRATE (PF) 100 MCG/2ML IJ SOLN
INTRAMUSCULAR | Status: DC | PRN
Start: 2016-02-03 — End: 2016-02-03
  Administered 2016-02-03: 25 ug via INTRAVENOUS

## 2016-02-03 MED ORDER — SODIUM CHLORIDE 0.9% FLUSH
3.0000 mL | Freq: Two times a day (BID) | INTRAVENOUS | Status: DC
  Administered 2016-02-03 – 2016-02-04 (×3): 3 mL via INTRAVENOUS

## 2016-02-03 MED ORDER — MIDAZOLAM HCL 10 MG/2ML IJ SOLN
INTRAMUSCULAR | Status: DC | PRN
  Administered 2016-02-03: 1 mg via INTRAVENOUS
  Administered 2016-02-03: 2 mg via INTRAVENOUS

## 2016-02-03 MED ORDER — NEOSTIGMINE METHYLSULFATE 5 MG/5ML IV SOSY
PREFILLED_SYRINGE | INTRAVENOUS | Status: DC | PRN
  Administered 2016-02-03: 4 mg via INTRAVENOUS

## 2016-02-03 MED ORDER — LIDOCAINE 2% (20 MG/ML) 5 ML SYRINGE
INTRAMUSCULAR | Status: DC | PRN
  Administered 2016-02-03: 60 mg via INTRAVENOUS

## 2016-02-03 MED ORDER — SODIUM CHLORIDE 0.9% FLUSH: 3.0000 mL | INTRAVENOUS | Status: DC | PRN

## 2016-02-03 MED ORDER — ONDANSETRON HCL 4 MG/2ML IJ SOLN: 4.0000 mg | Freq: Four times a day (QID) | INTRAMUSCULAR | Status: DC | PRN

## 2016-02-03 MED ORDER — EPHEDRINE SULFATE 50 MG/ML IJ SOLN
INTRAMUSCULAR | Status: DC | PRN
  Administered 2016-02-03 (×2): 5 mg via INTRAVENOUS

## 2016-02-03 MED ORDER — FENTANYL CITRATE (PF) 100 MCG/2ML IJ SOLN
INTRAMUSCULAR | Status: AC
  Filled 2016-02-03: qty 2

## 2016-02-03 MED ORDER — ONDANSETRON HCL 4 MG/2ML IJ SOLN
INTRAMUSCULAR | Status: DC | PRN
  Administered 2016-02-03: 4 mg via INTRAVENOUS

## 2016-02-03 MED ORDER — MIDAZOLAM HCL 5 MG/ML IJ SOLN
INTRAMUSCULAR | Status: AC
  Filled 2016-02-03: qty 2

## 2016-02-03 MED ORDER — ACETAMINOPHEN 325 MG PO TABS
ORAL_TABLET | ORAL | Status: AC
Start: 2016-02-03 — End: 2016-02-03
  Filled 2016-02-03: qty 2

## 2016-02-03 MED ORDER — PROTAMINE SULFATE 10 MG/ML IV SOLN
INTRAVENOUS | Status: DC | PRN
  Administered 2016-02-03: 30 mg via INTRAVENOUS

## 2016-02-03 MED ORDER — RIVAROXABAN 15 MG PO TABS
15.0000 mg | ORAL_TABLET | Freq: Every day | ORAL | Status: DC
  Administered 2016-02-03 – 2016-02-04 (×2): 15 mg via ORAL
  Filled 2016-02-03 (×2): qty 1

## 2016-02-03 MED ORDER — IOPAMIDOL (ISOVUE-370) INJECTION 76%
INTRAVENOUS | Status: DC | PRN
  Administered 2016-02-03: 3 mL via INTRAVENOUS

## 2016-02-03 MED ORDER — SODIUM CHLORIDE 0.9 % IV SOLN
INTRAVENOUS | Status: DC
  Administered 2016-02-03: 08:00:00 via INTRAVENOUS

## 2016-02-03 MED ORDER — SODIUM CHLORIDE 0.9 % IV SOLN: 250.0000 mL | INTRAVENOUS | Status: DC | PRN

## 2016-02-03 MED ORDER — INSULIN ASPART 100 UNIT/ML ~~LOC~~ SOLN: 10.0000 [IU] | Freq: Every day | SUBCUTANEOUS | Status: DC

## 2016-02-03 MED ORDER — INSULIN GLARGINE 100 UNIT/ML ~~LOC~~ SOLN
10.0000 [IU] | Freq: Every day | SUBCUTANEOUS | Status: DC
  Filled 2016-02-03: qty 0.1

## 2016-02-03 MED ORDER — GABAPENTIN 100 MG PO CAPS
100.0000 mg | ORAL_CAPSULE | Freq: Every day | ORAL | Status: DC
  Administered 2016-02-04: 100 mg via ORAL
  Filled 2016-02-03: qty 1

## 2016-02-03 MED ORDER — CITALOPRAM HYDROBROMIDE 20 MG PO TABS
20.0000 mg | ORAL_TABLET | Freq: Every day | ORAL | Status: DC
  Administered 2016-02-04: 20 mg via ORAL
  Filled 2016-02-03: qty 1

## 2016-02-03 MED ORDER — ROPINIROLE HCL 0.25 MG PO TABS
0.2500 mg | ORAL_TABLET | Freq: Three times a day (TID) | ORAL | Status: DC
  Administered 2016-02-03 – 2016-02-04 (×4): 0.25 mg via ORAL
  Filled 2016-02-03 (×5): qty 1

## 2016-02-03 MED ORDER — FENTANYL CITRATE (PF) 100 MCG/2ML IJ SOLN: 25.0000 ug | INTRAMUSCULAR | Status: DC | PRN

## 2016-02-03 MED ORDER — CARBIDOPA-LEVODOPA 25-100 MG PO TABS
1.0000 | ORAL_TABLET | Freq: Three times a day (TID) | ORAL | Status: DC
  Administered 2016-02-03 – 2016-02-04 (×4): 1 via ORAL
  Filled 2016-02-03 (×5): qty 1

## 2016-02-03 MED ORDER — HEPARIN SODIUM (PORCINE) 1000 UNIT/ML IJ SOLN
INTRAMUSCULAR | Status: DC | PRN
  Administered 2016-02-03 (×2): 1000 [IU] via INTRAVENOUS

## 2016-02-03 MED ORDER — BUPIVACAINE HCL (PF) 0.25 % IJ SOLN
INTRAMUSCULAR | Status: DC | PRN
  Administered 2016-02-03: 10 mL

## 2016-02-03 MED ORDER — PROMETHAZINE HCL 25 MG/ML IJ SOLN: 6.2500 mg | INTRAMUSCULAR | Status: DC | PRN

## 2016-02-03 MED ORDER — IRBESARTAN 150 MG PO TABS
300.0000 mg | ORAL_TABLET | Freq: Every day | ORAL | Status: DC
  Administered 2016-02-04: 300 mg via ORAL
  Filled 2016-02-03: qty 2

## 2016-02-03 MED ORDER — GLYCOPYRROLATE 0.2 MG/ML IV SOSY
PREFILLED_SYRINGE | INTRAVENOUS | Status: DC | PRN
  Administered 2016-02-03: 0.6 mg via INTRAVENOUS

## 2016-02-03 MED ORDER — PROPOFOL 10 MG/ML IV BOLUS
INTRAVENOUS | Status: DC | PRN
  Administered 2016-02-03: 10 mg via INTRAVENOUS
  Administered 2016-02-03: 150 mg via INTRAVENOUS
  Administered 2016-02-03: 10 mg via INTRAVENOUS

## 2016-02-03 MED ORDER — ACETAMINOPHEN 325 MG PO TABS
650.0000 mg | ORAL_TABLET | ORAL | Status: DC | PRN
  Administered 2016-02-03 – 2016-02-04 (×3): 650 mg via ORAL
  Filled 2016-02-03 (×2): qty 2

## 2016-02-03 MED ORDER — FENTANYL CITRATE (PF) 100 MCG/2ML IJ SOLN
INTRAMUSCULAR | Status: DC | PRN
  Administered 2016-02-03: 50 ug via INTRAVENOUS
  Administered 2016-02-03: 25 ug via INTRAVENOUS

## 2016-02-03 MED ORDER — TRAZODONE HCL 50 MG PO TABS
50.0000 mg | ORAL_TABLET | Freq: Every day | ORAL | Status: DC
  Administered 2016-02-03: 50 mg via ORAL
  Filled 2016-02-03: qty 1

## 2016-02-03 SURGICAL SUPPLY — 19 items
BAG SNAP BAND KOVER 36X36 (MISCELLANEOUS) ×3 IMPLANT
BLANKET WARM UNDERBOD FULL ACC (MISCELLANEOUS) ×3 IMPLANT
CATH NAVISTAR SMARTTOUCH DF (ABLATOR) ×2 IMPLANT
CATH SOUNDSTAR 3D IMAGING (CATHETERS) ×2 IMPLANT
CATH VARIABLE LASSO NAV 2515 (CATHETERS) ×2 IMPLANT
CATH WEBSTER BI DIR CS D-F CRV (CATHETERS) ×2 IMPLANT
COVER SWIFTLINK CONNECTOR (BAG) ×3 IMPLANT
NDL TRANSEP BRK 71CM 407200 (NEEDLE) IMPLANT
NEEDLE TRANSEP BRK 71CM 407200 (NEEDLE) ×3 IMPLANT
PACK EP LATEX FREE (CUSTOM PROCEDURE TRAY) ×3
PACK EP LF (CUSTOM PROCEDURE TRAY) ×1 IMPLANT
PAD DEFIB LIFELINK (PAD) ×3 IMPLANT
PATCH CARTO3 (PAD) ×2 IMPLANT
SHEATH AVANTI 11F 11CM (SHEATH) ×2 IMPLANT
SHEATH PINNACLE 7F 10CM (SHEATH) ×4 IMPLANT
SHEATH PINNACLE 9F 10CM (SHEATH) ×2 IMPLANT
SHEATH SWARTZ TS SL2 63CM 8.5F (SHEATH) ×2 IMPLANT
SHIELD RADPAD SCOOP 12X17 (MISCELLANEOUS) ×3 IMPLANT
TUBING SMART ABLATE COOLFLOW (TUBING) ×2 IMPLANT

## 2016-02-03 NOTE — Progress Notes (Signed)
  Echocardiogram Echocardiogram Transesophageal has been performed.  Darlina Sicilian M 02/03/2016, 9:13 AM

## 2016-02-03 NOTE — CV Procedure (Signed)
Procedure: TEE  Indication: Atrial fibrillation  Sedation: Versed 3 mg IV, Fentanyl 25 mcg IV  Findings: Please see echo section for full report.  Normal LV size with moderate LV hypertrophy.  EF 60-65% with normal wall motion.  Mildly dilated RV with normal systolic function.  Peak RV-RA gradient 40 mmHg.  Trivial TR.  The mitral valve was thickened with no stenosis and mild to moderate regurgitation.  No systolic reversal in the pulmonary vein doppler pattern.  There was turbulence across the pulmonic valve but peak gradient only 12 mmHg and trivial PI.  The aortic valve was mild to moderately calcified with no stenosis or regurgitation.  Moderate left atrial enlargement, no LA appendage thrombus.  Normal right atrium.  No PFO or ASD.  The aorta was normal in caliber with minimal plaque.    May proceed with ablation.   Melissa Powers 02/03/2016 8:52 AM

## 2016-02-03 NOTE — Progress Notes (Addendum)
Site area: RFV x 3 Site Prior to Removal:  Level 0 Pressure Applied For:25 min Manual:   yes Patient Status During Pull:  stable Post Pull Site:  Level 0 Post Pull Instructions Given:  yes Post Pull Pulses Present: palpable Dressing Applied: tegaderm  Bedrest begins @ [id number] till 1945 Comments:

## 2016-02-03 NOTE — H&P (Signed)
CC: afib  Melissa Powers is a 70 y.o. female with a history of persistent atrial fibrillation who presents for TEE and afib ablation.  The patient was initially diagnosed with atrial fibrillation 2 years after presenting with symptoms of palpitations, shortness of breath, and fatigue.  She reports that frequently her afib "comes and goes".  Episodes have increased in frequency and duration.  She has been on xarelto for 2 years.  She was started on amiodarone 6-7 months ago.  AF continues despite amiodarone.  She required cardioversion this past week.  She does not feel as well in her AF.  She reports having SOB during her AFib.  She is currently on high doses of amiodarone.  Today, she denies symptoms of chest pain,  orthopnea, PND, lower extremity edema, dizziness, presyncope, syncope,  bleeding, or neurologic sequela. The patient is tolerating medications without difficulties and is otherwise without complaint today.    Atrial Fibrillation Risk Factors:  she does have symptoms or diagnosis of sleep apnea. she is not compliant with CPAP therapy.  She says she cannot tolerate the mask.  she does not have a history of rheumatic fever.  she does not have a history of alcohol use.  she has a BMI of Body mass index is 34.51 kg/m.Marland Kitchen    Filed Weights   12/31/15 1243  Weight: 194 lb 12.8 oz (88.4 kg)    LA size: 43   Atrial Fibrillation Management history:  Previous antiarrhythmic drugs: amiodarone  Previous cardioversions: 10/17  Previous ablations: none  CHADS2VASC score: 5  Anticoagulation history: xarelto       Past Medical History:  Diagnosis Date  . Anxiety   . Chronic kidney disease (CKD), stage III (moderate)   . COPD (chronic obstructive pulmonary disease) ([place])   . Diastolic dysfunction with chronic heart failure ([place])   . Hyperlipidemia   . Hypertension   . Parkinson's disease ([place])    with deep brain stimulator  . Persistent atrial  fibrillation ([place])   . Pneumonia 08/2015  . Sleep apnea    "tried mask; can't wear it" (10/19/2015)  . Type II diabetes mellitus ([place])         Past Surgical History:  Procedure Laterality Date  . ABDOMINAL HYSTERECTOMY    . CARDIOVERSION N/A 12/22/2015   Procedure: CARDIOVERSION;  Surgeon: Jolaine Artist, MD;  Location: [facility] ENDOSCOPY;  Service: Cardiovascular;  Laterality: N/A;  . DEEP BRAIN STIMULATOR PLACEMENT  01/2015   for Parikinsons treatment  . TUBAL LIGATION            Current Outpatient Prescriptions  Medication Sig Dispense Refill  . amiodarone (PACERONE) 200 MG tablet Take 2 tablets (400 mg total) by mouth 2 (two) times daily. 120 tablet 6  . aspirin EC 81 MG EC tablet Take 1 tablet (81 mg total) by mouth daily.    Marland Kitchen atorvastatin (LIPITOR) 20 MG tablet Take 1 tablet (20 mg total) by mouth daily at 6 PM. 30 tablet 0  . carbidopa-levodopa (SINEMET IR) 25-100 MG per tablet Take 1 tablet by mouth 3 (three) times daily.    . cholecalciferol (VITAMIN D) 1000 units tablet Take 2,000 Units by mouth daily.    . citalopram (CELEXA) 20 MG tablet Take 20 mg by mouth daily.    Marland Kitchen gabapentin (NEURONTIN) 100 MG capsule Take 300 mg by mouth 3 (three) times daily.     . hydrALAZINE (APRESOLINE) 50 MG tablet Take 1 tablet (50 mg total) by mouth 3 (  three) times daily. 90 tablet 6  . insulin aspart (NOVOLOG) 100 UNIT/ML injection Inject 10 Units into the skin at bedtime.    . meclizine (ANTIVERT) 12.5 MG tablet Take 1 tablet (12.5 mg total) by mouth 2 (two) times daily as needed for dizziness. 30 tablet 0  . mupirocin ointment (BACTROBAN) 2 % Place 1 application into the nose 2 (two) times daily. 22 g 0  . Rivaroxaban (XARELTO) 15 MG TABS tablet Take 15 mg by mouth daily with supper.    Marland Kitchen rOPINIRole (REQUIP) 0.25 MG tablet Take 1 tablet (0.25 mg total) by mouth 3 (three) times daily. 30 tablet 0  . torsemide (DEMADEX) 20 MG tablet Take 2 tablets (40 mg total) by  mouth 2 (two) times daily. 150 tablet 3  . traZODone (DESYREL) 50 MG tablet Take 50 mg by mouth at bedtime.     . valsartan (DIOVAN) 320 MG tablet Take 320 mg by mouth daily.     No current facility-administered medications for this encounter.             Facility-Administered Medications Ordered in Other Encounters  Medication Dose Route Frequency Provider Last Rate Last Dose  . 0.9 %  sodium chloride infusion    Continuous PRN Neldon Newport, CRNA 0 mL/hr at 12/03/15 1156           Allergies  Allergen Reactions  . Morphine And Related Other (See Comments)    Pt reports intolerance due to sedation from morphine    Social History        Social History  . Marital status: Married    Spouse name: N/A  . Number of children: N/A  . Years of education: N/A      Occupational History  . Not on file.        Social History Main Topics  . Smoking status: Former Smoker    Quit date: 12/31/1983  . Smokeless tobacco: Never Used  . Alcohol use No  . Drug use: No  . Sexual activity: Not Currently       Other Topics Concern  . Not on file      Social History Narrative   Pt lives in [place] with spouse.  Retired Quarry manager.          Family History  Problem Relation Age of Onset  . Dementia Mother   . Heart Problems Father   . Stroke Sister   . Kidney failure Brother     on ESRD   The patient does not have a history of early familial atrial fibrillation or other arrhythmias.  ROS- All systems are reviewed and negative except as per the HPI above.  Physical Exam: Vitals:   02/03/16 0717  BP: (!) 172/55  Pulse: (!) 59  Resp: 20  Temp: 98.1 F (36.7 C)   GEN- The patient is elderly appearing, alert and oriented x 3 today.   Head- normocephalic, atraumatic Eyes-  Sclera clear, conjunctiva pink Ears- hearing intact Oropharynx- clear Neck- supple  Lungs- Clear to ausculation bilaterally, normal work of breathing Heart- Regular rate  and rhythm, no murmurs, rubs or gallops  GI- soft, NT, ND, + BS Extremities- no clubbing, cyanosis, or edema MS- no significant deformity or atrophy Skin- no rash or lesion Psych- euthymic mood, full affect Neuro- + resting tremor from parkinsons  EKG today demonstrates sinus rhythm Echo 8/17 demonstrated mild LVH, EF 55%, mild to moderaet MR, moderate LA enlargement  Assessment and Plan:  1. Persistent atrial  fibrillation The patient has symptomatic atrial fibrillation.  Though previously paroxysmal, it appears to be progressing to persistent.  The patients CHAD2VASC score is 5.  she is  appropriately anticoagulated at this time with xarelto 15mg  daily (CrCl 42). Rate control has been very difficult.  The patients left atrial size is moderately enlarged.  Additional echo findings include moderate MR with preserved EF. A long discussion with the patient was had today regarding therapeutic strategies.  Extensive discussion of lifestyle modification including treatment of sleep apnea was also discussed.  Therapeutic strategies for afib including medicine and ablation were discussed in detail with the patient today. Risk, benefits, and alternatives to TEE as well as EP study and radiofrequency ablation for afib were also discussed in detail today. These risks include but are not limited to stroke, bleeding, vascular damage, tamponade, perforation, damage to the esophagus, lungs, and other structures, pulmonary vein stenosis, worsening renal function, and death. The patient understands these risk and wishes to proceed.     I have spoken with Dr Aundra Dubin who will perform TEE prior to ablation to evaluate pulm HTN, mitral regurgitation and exclude LAA thrombus.  2. Morbid obesity As above, lifestyle modification was discussed at length including regular exercise and weight reduction.  3. Obstructive sleep apnea Not compliant with CPAP.  I have referred to Dr Ron Parker for other treatment  options.  4. Chronic diastolic dysfunction Hopefully will improve with sinus rhythm long term.  [place] Grayer MD, [facility] 02/03/2016 8:02 AM

## 2016-02-03 NOTE — Telephone Encounter (Signed)
Husband called asking about results from previous test but did not specify which results.  Called back and left message.

## 2016-02-03 NOTE — Progress Notes (Signed)
Asked patient if she takes Novolog 10 units at bedtime or Lantus 10 units at bedtime, because she doesn't take Novolog any other time of day. Patient stated she did not know the name but knows its 10 units. She also stated she had a list of current medications in her bag on the counter. After reviewing the list RN noted that she takes Lantus 10 units at bedtime not Novolog. Spoke with Dr. Koleen Nimrod (provider on call) who said to discontinue the Novolog order and give Lantus instead.

## 2016-02-03 NOTE — Anesthesia Postprocedure Evaluation (Signed)
Anesthesia Post Note  Patient: Melissa Powers  Procedure(s) Performed: Procedure(s) (LRB): Atrial Fibrillation Ablation (N/A)  Patient location during evaluation: PACU Anesthesia Type: General Level of consciousness: awake Pain management: pain level controlled Vital Signs Assessment: post-procedure vital signs reviewed and stable Respiratory status: spontaneous breathing Cardiovascular status: stable Anesthetic complications: no    Last Vitals:  Vitals:   02/03/16 1300 02/03/16 1305  BP: (!) 105/32 (!) 105/35  Pulse: 72 72  Resp: (!) 21 15  Temp:      Last Pain:  Vitals:   02/03/16 1243  TempSrc: Temporal                 EDWARDS,Keddrick Wyne

## 2016-02-03 NOTE — Transfer of Care (Signed)
Immediate Anesthesia Transfer of Care Note  Patient: Melissa Powers  Procedure(s) Performed: Procedure(s): Atrial Fibrillation Ablation (N/A)  Patient Location: Cath Lab  Anesthesia Type:General  Level of Consciousness: awake, alert  and oriented  Airway & Oxygen Therapy: Patient Spontanous Breathing and Patient connected to nasal cannula oxygen  Post-op Assessment: Report given to RN and Post -op Vital signs reviewed and stable  Post vital signs: Reviewed and stable  Last Vitals:  Vitals:   02/03/16 0845 02/03/16 0850  BP: (!) 189/49 (!) 161/45  Pulse: 60 (!) 57  Resp: 18 18  Temp:      Last Pain:  Vitals:   02/03/16 0717  TempSrc: Oral         Complications: No apparent anesthesia complications

## 2016-02-03 NOTE — Anesthesia Preprocedure Evaluation (Addendum)
Anesthesia Evaluation  Patient identified by MRN, date of birth, ID band Patient awake    Reviewed: Allergy & Precautions, NPO status   Airway Mallampati: II  TM Distance: >3 FB     Dental   Pulmonary sleep apnea , pneumonia, COPD, former smoker,    breath sounds clear to auscultation       Cardiovascular hypertension,  Rhythm:Irregular Rate:Normal     Neuro/Psych    GI/Hepatic negative GI ROS, Neg liver ROS,   Endo/Other  diabetes  Renal/GU Renal disease     Musculoskeletal   Abdominal   Peds  Hematology   Anesthesia Other Findings   Reproductive/Obstetrics                            Anesthesia Physical Anesthesia Plan  ASA: III  Anesthesia Plan: General   Post-op Pain Management:    Induction: Intravenous  Airway Management Planned: Oral ETT  Additional Equipment:   Intra-op Plan:   Post-operative Plan: Possible Post-op intubation/ventilation  Informed Consent: I have reviewed the patients History and Physical, chart, labs and discussed the procedure including the risks, benefits and alternatives for the proposed anesthesia with the patient or authorized representative who has indicated his/her understanding and acceptance.   Dental advisory given  Plan Discussed with: CRNA, Anesthesiologist and Surgeon  Anesthesia Plan Comments:         Anesthesia Quick Evaluation

## 2016-02-03 NOTE — Interval H&P Note (Signed)
History and Physical Interval Note:  02/03/2016 8:25 AM  Melissa Powers  has presented today for surgery, with the diagnosis of AFIB  The various methods of treatment have been discussed with the patient and family. After consideration of risks, benefits and other options for treatment, the patient has consented to  Procedure(s): TRANSESOPHAGEAL ECHOCARDIOGRAM (TEE) (N/A) as a surgical intervention .  The patient's history has been reviewed, patient examined, no change in status, stable for surgery.  I have reviewed the patient's chart and labs.  Questions were answered to the patient's satisfaction.     Corlene Sabia [name]

## 2016-02-04 ENCOUNTER — Encounter (HOSPITAL_COMMUNITY): Payer: Self-pay | Admitting: Internal Medicine

## 2016-02-04 ENCOUNTER — Other Ambulatory Visit: Payer: Self-pay

## 2016-02-04 DIAGNOSIS — Z7901 Long term (current) use of anticoagulants: Secondary | ICD-10-CM | POA: Diagnosis not present

## 2016-02-04 DIAGNOSIS — G4733 Obstructive sleep apnea (adult) (pediatric): Secondary | ICD-10-CM | POA: Diagnosis not present

## 2016-02-04 DIAGNOSIS — I480 Paroxysmal atrial fibrillation: Secondary | ICD-10-CM

## 2016-02-04 DIAGNOSIS — I481 Persistent atrial fibrillation: Secondary | ICD-10-CM | POA: Diagnosis not present

## 2016-02-04 DIAGNOSIS — E785 Hyperlipidemia, unspecified: Secondary | ICD-10-CM | POA: Diagnosis not present

## 2016-02-04 LAB — BASIC METABOLIC PANEL
ANION GAP: 9 (ref 5–15)
BUN: 39 mg/dL — ABNORMAL HIGH (ref 6–20)
CALCIUM: 8.5 mg/dL — AB (ref 8.9–10.3)
CHLORIDE: 101 mmol/L (ref 101–111)
CO2: 28 mmol/L (ref 22–32)
Creatinine, Ser: 1.73 mg/dL — ABNORMAL HIGH (ref 0.44–1.00)
GFR calc non Af Amer: 29 mL/min — ABNORMAL LOW (ref >60)
GFR, EST AFRICAN AMERICAN: 33 mL/min — AB (ref >60)
GLUCOSE: 104 mg/dL — AB (ref 65–99)
POTASSIUM: 4.4 mmol/L (ref 3.5–5.1)
Sodium: 138 mmol/L (ref 135–145)

## 2016-02-04 LAB — GLUCOSE, CAPILLARY: Glucose-Capillary: 118 mg/dL — ABNORMAL HIGH (ref 65–99)

## 2016-02-04 MED ORDER — VALSARTAN 320 MG PO TABS: 320.0000 mg | ORAL_TABLET | Freq: Every day | ORAL | Status: DC

## 2016-02-04 MED ORDER — PHENOL 1.4 % MT LIQD: 1.0000 | OROMUCOSAL | 0 refills | Status: AC | PRN

## 2016-02-04 MED ORDER — PANTOPRAZOLE SODIUM 40 MG PO TBEC: 40.0000 mg | DELAYED_RELEASE_TABLET | Freq: Every day | ORAL | 0 refills | Status: DC

## 2016-02-04 MED ORDER — ROPINIROLE HCL 0.25 MG PO TABS: 0.5000 mg | ORAL_TABLET | Freq: Two times a day (BID) | ORAL | 0 refills | Status: DC

## 2016-02-04 MED ORDER — PHENOL 1.4 % MT LIQD: 1.0000 | OROMUCOSAL | Status: DC | PRN

## 2016-02-04 MED ORDER — TORSEMIDE 20 MG PO TABS: 20.0000 mg | ORAL_TABLET | Freq: Every day | ORAL | 3 refills | Status: DC

## 2016-02-04 MED ORDER — AMIODARONE HCL 200 MG PO TABS: 100.0000 mg | ORAL_TABLET | Freq: Every day | ORAL | Status: DC

## 2016-02-04 NOTE — Progress Notes (Signed)
Doing well today.  Low bp noted overnight and pre-procedure  Limited TTE by me this am reveals no pericardial effusion.  I suspect that she is very dry.  Will adjust home medicines as follows: 1. Stop hydralazine 2. Stop metolazone 3. Hold torsemide x 2 days then reduce to 20mg  daily 4. Resume valsartan on Monday 5. Reduce amiodarone to 100mg  daily  Continue all other medicines at current dosing  Will need to arrange these changes with [facility]  Follow-up in AF clinic on Tuesday of next week.  DC to home today  [place] Grayer MD, [facility] 02/04/2016 7:58 AM

## 2016-02-04 NOTE — Progress Notes (Signed)
Pt discharged to home with husband and son.  All teaching completed.

## 2016-02-04 NOTE — Discharge Summary (Signed)
ELECTROPHYSIOLOGY PROCEDURE DISCHARGE SUMMARY    Patient ID: Melissa Powers,  MRN: [id number], DOB/AGE: [id number] 70 y.o.  Admit date: 02/03/2016 Discharge date: 02/04/2016  Primary Care Physician: Melissa Berger, MD Primary Cardiologist: Melissa Powers Electrophysiologist: Melissa Grayer, MD  Primary Discharge Diagnosis:  Persistent atrial fibrillation status post ablation this admission  Secondary Discharge Diagnosis:  1.  Hypertension 2.  Hyperlipidemia 3.  OSA 4.  Diabetes 5.  Diastolic heart failure  Procedures This Admission:  1.  Electrophysiology study and radiofrequency catheter ablation on 02/03/16 by Melissa Melissa Powers.  This study demonstrated sinus rhythm upon presentation; intracardiac echo reveals a moderate sized left atrium with a common ostium to the LSVP and LIPV; successful electrical isolation and anatomical encircling of all four pulmonary veins with radiofrequency current.  WACA was used; additional ablation was performed along the posterior wall of the LA in order to create a "standard box" lesion; no inducible arrhythmias following ablation; no early apparent complications..    Brief HPI: Melissa Powers is a 70 y.o. female with a history of persistent atrial fibrillation.  They have failed medical therapy with amiodarone. Risks, benefits, and alternatives to catheter ablation of atrial fibrillation were reviewed with the patient who wished to proceed.  The patient underwent TEE prior to the procedure which demonstrated normal LV function and no LAA thrombus.    Hospital Course:  The patient was admitted and underwent EPS/RFCA of atrial fibrillation with details as outlined above.  They were monitored on telemetry overnight which demonstrated sinus rhythm with PAC's.  Groin was without complication on the day of discharge.  The patient was examined and considered to be stable for discharge.  Beside echo done by Melissa Melissa Powers with no effusion.  Wound care and restrictions were  reviewed with the patient.  The patient will be seen back by Melissa Palau, NP in 4 weeks and Melissa Melissa Powers in 12 weeks for post ablation follow up.   The patient's deep brain stimulator was interrogated prior to AF ablation and found to be programmed off. We have contacted rep for company who will reach out to patient to get her new patient programmer. She has been advised to keep follow up as scheduled with neurosurgeon.   Blood pressure low this admission with orthostatic symptoms at home. Med changes as outlined in Melissa Powers note for discharge. Follow up with AF clinic next week for BP and AF management.  Her medications are filled at [facility] in [place] and are in bubble package. Have asked her family to bring in medications from home so pharmacy can help to make medications accurate for discharge.   This patients CHA2DS2-VASc Score and unadjusted Ischemic Stroke Rate (% per year) is equal to 7.2 % stroke rate/year from a score of 5 Above score calculated as 1 point each if present [CHF, HTN, DM, Vascular=MI/PAD/Aortic Plaque, Age if 65-74, or Female] Above score calculated as 2 points each if present [Age > 75, or Stroke/TIA/TE]   Physical Exam: Vitals:   02/04/16 0300 02/04/16 0400 02/04/16 0500 02/04/16 0812  BP: (!) 99/45  (!) 111/57 (!) 114/36  Pulse: (!) 57 60 64 (!) 106  Resp: 17 14 (!) 21 (!) 24  Temp:  97.6 F (36.4 C)  98.1 F (36.7 C)  TempSrc:  Oral  Oral  SpO2: 100% 100% 91% 92%  Weight:      Height:        GEN- The patient is elderly appearing, alert and oriented x 3  today.   HEENT: normocephalic, atraumatic; sclera clear, conjunctiva pink; hearing intact; oropharynx clear; neck supple  Lungs- Clear to ausculation bilaterally, normal work of breathing.  No wheezes, rales, rhonchi Heart- Regular rate and rhythm, no murmurs, rubs or gallops  GI- soft, non-tender, non-distended, bowel sounds present  Extremities- no clubbing, cyanosis, or edema; DP/PT/radial pulses 2+  bilaterally, groin without hematoma/bruit MS- no significant deformity or atrophy Skin- warm and dry, no rash or lesion Psych- euthymic mood, full affect Neuro- strength and sensation are intact   Labs:   Lab Results  Component Value Date   WBC 6.9 02/03/2016   HGB 10.5 (L) 02/03/2016   HCT 33.5 (L) 02/03/2016   MCV 85.9 02/03/2016   PLT 216 02/03/2016     Recent Labs Lab 02/04/16 0326  NA 138  K 4.4  CL 101  CO2 28  BUN 39*  CREATININE 1.73*  CALCIUM 8.5*  GLUCOSE 104*     Discharge Medications:    Medication List    STOP taking these medications   hydrALAZINE 50 MG tablet Commonly known as:  APRESOLINE   metolazone 2.5 MG tablet Commonly known as:  ZAROXOLYN     TAKE these medications   amiodarone 200 MG tablet Commonly known as:  PACERONE Take 0.5 tablets (100 mg total) by mouth daily. What changed:  how much to take   atorvastatin 20 MG tablet Commonly known as:  LIPITOR Take 10 mg by mouth daily.   carbidopa-levodopa 25-100 MG tablet Commonly known as:  SINEMET IR Take 1 tablet by mouth 3 (three) times daily.   cholecalciferol 1000 units tablet Commonly known as:  VITAMIN D Take 2,000 Units by mouth daily.   citalopram 20 MG tablet Commonly known as:  CELEXA Take 20 mg by mouth daily.   gabapentin 100 MG capsule Commonly known as:  NEURONTIN Take 100 mg by mouth daily.   insulin glargine 100 UNIT/ML injection Commonly known as:  LANTUS Inject 10 Units into the skin at bedtime.   meclizine 12.5 MG tablet Commonly known as:  ANTIVERT Take 1 tablet (12.5 mg total) by mouth 2 (two) times daily as needed for dizziness.   mupirocin ointment 2 % Commonly known as:  BACTROBAN Place 1 application into the nose 2 (two) times daily.   pantoprazole 40 MG tablet Commonly known as:  PROTONIX Take 1 tablet (40 mg total) by mouth daily.   potassium chloride SA 20 MEQ tablet Commonly known as:  K-DUR,KLOR-CON Take 1 tablet (20 mEq total) by  mouth once a week. On Thursday with Metolazone   Rivaroxaban 15 MG Tabs tablet Commonly known as:  XARELTO Take 15 mg by mouth daily with supper.   rOPINIRole 0.25 MG tablet Commonly known as:  REQUIP Take 1 tablet (0.25 mg total) by mouth 3 (three) times daily.   torsemide 20 MG tablet Commonly known as:  DEMADEX Take 1 tablet (20 mg total) by mouth daily. Hold for 2 days, start 20mg  daily on 02/06/16 What changed:  how much to take  when to take this  additional instructions   traZODone 50 MG tablet Commonly known as:  DESYREL Take 50 mg by mouth at bedtime.   valsartan 320 MG tablet Commonly known as:  DIOVAN Take 1 tablet (320 mg total) by mouth daily.       Disposition:  Discharge Instructions    Diet - low sodium heart healthy    Complete by:  As directed    Increase activity slowly  Complete by:  As directed      Follow-up Information    [facility] Follow up on 02/08/2016.   Specialty:  Cardiology Why:  at 1:30PM  Contact information: [id number] [place], [place] Follow up on 05/10/2016.   Specialty:  Cardiology Why:  at [facility] information: [place] [address] [phone]           Duration of Discharge Encounter: Greater than 30 minutes including physician time.  Signed, Chanetta Marshall, NP 02/04/2016 8:40 AM  Trude Mcburney

## 2016-02-05 LAB — GLUCOSE, CAPILLARY: Glucose-Capillary: 145 mg/dL — ABNORMAL HIGH (ref 65–99)

## 2016-02-08 ENCOUNTER — Other Ambulatory Visit: Payer: Self-pay

## 2016-02-08 ENCOUNTER — Ambulatory Visit (HOSPITAL_COMMUNITY)
Admission: RE | Admit: 2016-02-08 | Discharge: 2016-02-08 | Disposition: A | Discharge status: Home / Self Care | Payer: Medicare (Managed Care) | Source: Ambulatory Visit | Attending: Nurse Practitioner | Admitting: Nurse Practitioner

## 2016-02-08 VITALS — BP 198/82 | HR 56 | Ht 63.0 in | Wt 203.0 lb

## 2016-02-08 DIAGNOSIS — E1122 Type 2 diabetes mellitus with diabetic chronic kidney disease: Secondary | ICD-10-CM | POA: Diagnosis not present

## 2016-02-08 DIAGNOSIS — I5030 Unspecified diastolic (congestive) heart failure: Secondary | ICD-10-CM | POA: Insufficient documentation

## 2016-02-08 DIAGNOSIS — Z79899 Other long term (current) drug therapy: Secondary | ICD-10-CM | POA: Diagnosis not present

## 2016-02-08 DIAGNOSIS — G4730 Sleep apnea, unspecified: Secondary | ICD-10-CM | POA: Insufficient documentation

## 2016-02-08 DIAGNOSIS — Z794 Long term (current) use of insulin: Secondary | ICD-10-CM | POA: Diagnosis not present

## 2016-02-08 DIAGNOSIS — F419 Anxiety disorder, unspecified: Secondary | ICD-10-CM | POA: Diagnosis not present

## 2016-02-08 DIAGNOSIS — Z87891 Personal history of nicotine dependence: Secondary | ICD-10-CM | POA: Insufficient documentation

## 2016-02-08 DIAGNOSIS — J449 Chronic obstructive pulmonary disease, unspecified: Secondary | ICD-10-CM | POA: Insufficient documentation

## 2016-02-08 DIAGNOSIS — Z823 Family history of stroke: Secondary | ICD-10-CM | POA: Insufficient documentation

## 2016-02-08 DIAGNOSIS — Z9889 Other specified postprocedural states: Secondary | ICD-10-CM | POA: Insufficient documentation

## 2016-02-08 DIAGNOSIS — Z885 Allergy status to narcotic agent status: Secondary | ICD-10-CM | POA: Diagnosis not present

## 2016-02-08 DIAGNOSIS — I481 Persistent atrial fibrillation: Secondary | ICD-10-CM

## 2016-02-08 DIAGNOSIS — E785 Hyperlipidemia, unspecified: Secondary | ICD-10-CM | POA: Insufficient documentation

## 2016-02-08 DIAGNOSIS — Z8249 Family history of ischemic heart disease and other diseases of the circulatory system: Secondary | ICD-10-CM | POA: Diagnosis not present

## 2016-02-08 DIAGNOSIS — I129 Hypertensive chronic kidney disease with stage 1 through stage 4 chronic kidney disease, or unspecified chronic kidney disease: Secondary | ICD-10-CM | POA: Insufficient documentation

## 2016-02-08 DIAGNOSIS — Z7901 Long term (current) use of anticoagulants: Secondary | ICD-10-CM | POA: Diagnosis not present

## 2016-02-08 DIAGNOSIS — N183 Chronic kidney disease, stage 3 (moderate): Secondary | ICD-10-CM | POA: Diagnosis not present

## 2016-02-08 DIAGNOSIS — I959 Hypotension, unspecified: Secondary | ICD-10-CM | POA: Insufficient documentation

## 2016-02-08 DIAGNOSIS — G20 Parkinson's disease: Secondary | ICD-10-CM | POA: Insufficient documentation

## 2016-02-08 DIAGNOSIS — I4819 Other persistent atrial fibrillation: Secondary | ICD-10-CM

## 2016-02-08 LAB — BASIC METABOLIC PANEL
ANION GAP: 10 (ref 5–15)
BUN: 34 mg/dL — ABNORMAL HIGH (ref 6–20)
CALCIUM: 8.8 mg/dL — AB (ref 8.9–10.3)
CO2: 23 mmol/L (ref 22–32)
Chloride: 105 mmol/L (ref 101–111)
Creatinine, Ser: 1.75 mg/dL — ABNORMAL HIGH (ref 0.44–1.00)
GFR, EST AFRICAN AMERICAN: 33 mL/min — AB (ref >60)
GFR, EST NON AFRICAN AMERICAN: 28 mL/min — AB (ref >60)
Glucose, Bld: 138 mg/dL — ABNORMAL HIGH (ref 65–99)
Potassium: 4.2 mmol/L (ref 3.5–5.1)
Sodium: 138 mmol/L (ref 135–145)

## 2016-02-08 MED ORDER — HYDRALAZINE HCL 50 MG PO TABS: 25.0000 mg | ORAL_TABLET | Freq: Three times a day (TID) | ORAL | Status: DC

## 2016-02-08 MED ORDER — TORSEMIDE 20 MG PO TABS: ORAL_TABLET | ORAL | 3 refills | Status: DC

## 2016-02-08 NOTE — Progress Notes (Signed)
Primary Care Physician: Maris Berger, MD Referring Physician:   MARQUIDA CARLSSON is a 70 y.o. female with a h/o persistent atrial fibrillation and  had failed medical therapy with amiodarone.The patient was admitted and underwent EPS/RFCA of atrial fibrillation . She was admitted 11/16 and was monitored on telemetry overnight which demonstrated sinus rhythm with PAC's.  Groin was without complication on the day of discharge.  The patient had hypotension during hospitalization and torsemide was held and restarted low dose at 20 mg a day, metolazone  and hydralazine were stopped.  She is the afib clinic today for f/u. She denies any swallowing difficulties or groin pain. She is short of breath on exertion and is having to prop herself up in bed with pillows. Her weight is up 8 lbs from discharge and BP today at 198/82. She has not noted any afib.  Today, she denies symptoms of palpitations, chest pain,PND,  dizziness, presyncope, syncope, or neurologic sequela. Positive for orthopnea, pedal edema and shortness of breath, increase in weight. The patient is tolerating medications without difficulties and is otherwise without complaint today.   Past Medical History:  Diagnosis Date  . Anxiety   . Chronic kidney disease (CKD), stage III (moderate)   . COPD (chronic obstructive pulmonary disease) ([place])   . Diastolic dysfunction with chronic heart failure ([place])   . Hyperlipidemia   . Hypertension   . Parkinson's disease ([place])    with deep brain stimulator  . Persistent atrial fibrillation ([place])   . Pneumonia 08/2015  . Sleep apnea    "tried mask; can't wear it" (10/19/2015)  . Type II diabetes mellitus ([place])    Past Surgical History:  Procedure Laterality Date  . ABDOMINAL HYSTERECTOMY    . CARDIOVERSION N/A 12/22/2015   Procedure: CARDIOVERSION;  Surgeon: Jolaine Artist, MD;  Location: [facility] ENDOSCOPY;  Service: Cardiovascular;  Laterality: N/A;  . DEEP BRAIN STIMULATOR PLACEMENT  01/2015    for Parikinsons treatment  . ELECTROPHYSIOLOGIC STUDY N/A 02/03/2016   Procedure: Atrial Fibrillation Ablation;  Surgeon: Thompson Grayer, MD;  Location: [facility] CV LAB;  Service: Cardiovascular;  Laterality: N/A;  . TEE WITHOUT CARDIOVERSION N/A 02/03/2016   Procedure: TRANSESOPHAGEAL ECHOCARDIOGRAM (TEE);  Surgeon: Larey Dresser, MD;  Location: [facility];  Service: Cardiovascular;  Laterality: N/A;  . TUBAL LIGATION      Current Outpatient Prescriptions  Medication Sig Dispense Refill  . amiodarone (PACERONE) 200 MG tablet Take 0.5 tablets (100 mg total) by mouth daily.    Marland Kitchen atorvastatin (LIPITOR) 20 MG tablet Take 10 mg by mouth daily.    . carbidopa-levodopa (SINEMET IR) 25-100 MG per tablet Take 1 tablet by mouth 3 (three) times daily.    . cholecalciferol (VITAMIN D) 1000 units tablet Take 2,000 Units by mouth daily.    . citalopram (CELEXA) 20 MG tablet Take 20 mg by mouth daily.    Marland Kitchen gabapentin (NEURONTIN) 100 MG capsule Take 100 mg by mouth daily.    . insulin glargine (LANTUS) 100 UNIT/ML injection Inject 10 Units into the skin at bedtime.    . meclizine (ANTIVERT) 12.5 MG tablet Take 1 tablet (12.5 mg total) by mouth 2 (two) times daily as needed for dizziness. 30 tablet 0  . mupirocin ointment (BACTROBAN) 2 % Place 1 application into the nose 2 (two) times daily. 22 g 0  . pantoprazole (PROTONIX) 40 MG tablet Take 1 tablet (40 mg total) by mouth daily. 45 tablet 0  . phenol (CHLORASEPTIC)  1.4 % LIQD Use as directed 1 spray in the mouth or throat as needed for throat irritation / pain.  0  . potassium chloride SA (K-DUR,KLOR-CON) 20 MEQ tablet Take 1 tablet (20 mEq total) by mouth once a week. On Thursday with Metolazone 5 tablet 3  . Rivaroxaban (XARELTO) 15 MG TABS tablet Take 15 mg by mouth daily with supper.    Marland Kitchen rOPINIRole (REQUIP) 0.25 MG tablet Take 2 tablets (0.5 mg total) by mouth 2 (two) times daily. 30 tablet 0  . torsemide (DEMADEX) 20 MG tablet Take 40mg  by  mouth in the morning and 20mg  in the evening 150 tablet 3  . traZODone (DESYREL) 50 MG tablet Take 50 mg by mouth at bedtime.     . valsartan (DIOVAN) 320 MG tablet Take 1 tablet (320 mg total) by mouth daily.    . hydrALAZINE (APRESOLINE) 50 MG tablet Take 0.5 tablets (25 mg total) by mouth 3 (three) times daily.     No current facility-administered medications for this encounter.    Facility-Administered Medications Ordered in Other Encounters  Medication Dose Route Frequency Provider Last Rate Last Dose  . 0.9 %  sodium chloride infusion    Continuous PRN Neldon Newport, CRNA 0 mL/hr at 12/03/15 1156      Allergies  Allergen Reactions  . Morphine And Related Other (See Comments)    Pt reports intolerance due to sedation from morphine    Social History   Social History  . Marital status: Married    Spouse name: N/A  . Number of children: N/A  . Years of education: N/A   Occupational History  . Not on file.   Social History Main Topics  . Smoking status: Former Smoker    Quit date: 12/31/1983  . Smokeless tobacco: Never Used  . Alcohol use No  . Drug use: No  . Sexual activity: Not Currently   Other Topics Concern  . Not on file   Social History Narrative   Pt lives in [place] with spouse.  Retired Quarry manager.    Family History  Problem Relation Age of Onset  . Dementia Mother   . Heart Problems Father   . Stroke Sister   . Kidney failure Brother     on ESRD    ROS- All systems are reviewed and negative except as per the HPI above  Physical Exam: Vitals:   02/08/16 1334  BP: (!) 198/82  Pulse: (!) 56  Weight: 203 lb (92.1 kg)  Height: 5\' 3"  (1.6 m)    GEN- The patient is well appearing, alert and oriented x 3 today.   Head- normocephalic, atraumatic Eyes-  Sclera clear, conjunctiva pink Ears- hearing intact Oropharynx- clear Neck- supple, no JVP Lymph- no cervical lymphadenopathy Lungs- Clear to ausculation bilaterally, normal work of  breathing Heart- Regular rate and rhythm, no murmurs, rubs or gallops, PMI not laterally displaced GI- soft, NT, ND, + BS Extremities- no clubbing, cyanosis, TED hose on with edematous LE's. MS- no significant deformity or atrophy Skin- no rash or lesion Psych- euthymic mood, full affect Neuro- strength and sensation are intact  EKG- SR at 56 bpm, Pr int 160 ms, qrs int 180 ms, qtc 575 ms Epic records reviewed  Assessment and Plan: 1. Persistent afib s/p ablation Appears to be in SR Continue amiodarone at 100 mg daily Continue xarelto at 15 mg a day  2. HTN Hypotension in the hospital but pt reports that BP has been running high at  home BP today at 198/82 Restart hydralazine but at 25 mg tid,instead of 50 mg tid Monitor and record BP readings at home   3.Diastolic heart failure Weight is up 8 lbs, with symptoms of cough, orthopnea and exertional dyspnea Will increase torsemide to 40 mg am and 20 mg pm, from 20 mg a day( states prior hospital dose   was 80 mg am and 40 mg pm) Weigh daily bmet today  Pt is to  call Monday and report weight, BP readings and symptoms  F/u with Dr. Haroldine Laws 11/30 and I will see back in afib clinic 12/15, Dr. Rayann Heman 2/21  Geroge Baseman. Shandie Bertz, [facility] [address] [place],  [address] [phone]

## 2016-02-08 NOTE — Patient Instructions (Signed)
Your physician has recommended you make the following change in your medication:   1) Increase torsemide to 40mg  in the morning and 20mg  in the evening. Take a extra 20mg  of torsemide when you get home today.  2)Restart hydralazine at 25mg  (1/2 of 50mg  tablet) three times a day  Monitor blood pressure and weights --- Call Butch Penny on Monday with how you are doing.

## 2016-02-17 ENCOUNTER — Ambulatory Visit (HOSPITAL_COMMUNITY)
Admission: RE | Admit: 2016-02-17 | Discharge: 2016-02-17 | Disposition: A | Discharge status: Home / Self Care | Payer: Medicare (Managed Care) | Source: Ambulatory Visit | Attending: Internal Medicine | Admitting: Internal Medicine

## 2016-02-17 VITALS — BP 180/84 | HR 73 | Wt 197.0 lb

## 2016-02-17 DIAGNOSIS — Z885 Allergy status to narcotic agent status: Secondary | ICD-10-CM | POA: Diagnosis not present

## 2016-02-17 DIAGNOSIS — J449 Chronic obstructive pulmonary disease, unspecified: Secondary | ICD-10-CM | POA: Diagnosis not present

## 2016-02-17 DIAGNOSIS — Z7901 Long term (current) use of anticoagulants: Secondary | ICD-10-CM | POA: Insufficient documentation

## 2016-02-17 DIAGNOSIS — N184 Chronic kidney disease, stage 4 (severe): Secondary | ICD-10-CM | POA: Diagnosis not present

## 2016-02-17 DIAGNOSIS — I481 Persistent atrial fibrillation: Secondary | ICD-10-CM | POA: Insufficient documentation

## 2016-02-17 DIAGNOSIS — I480 Paroxysmal atrial fibrillation: Secondary | ICD-10-CM | POA: Diagnosis not present

## 2016-02-17 DIAGNOSIS — E1122 Type 2 diabetes mellitus with diabetic chronic kidney disease: Secondary | ICD-10-CM | POA: Diagnosis not present

## 2016-02-17 DIAGNOSIS — I130 Hypertensive heart and chronic kidney disease with heart failure and stage 1 through stage 4 chronic kidney disease, or unspecified chronic kidney disease: Secondary | ICD-10-CM | POA: Insufficient documentation

## 2016-02-17 DIAGNOSIS — G4730 Sleep apnea, unspecified: Secondary | ICD-10-CM | POA: Insufficient documentation

## 2016-02-17 DIAGNOSIS — G20 Parkinson's disease: Secondary | ICD-10-CM | POA: Insufficient documentation

## 2016-02-17 DIAGNOSIS — E785 Hyperlipidemia, unspecified: Secondary | ICD-10-CM | POA: Diagnosis not present

## 2016-02-17 DIAGNOSIS — Z9889 Other specified postprocedural states: Secondary | ICD-10-CM | POA: Diagnosis not present

## 2016-02-17 DIAGNOSIS — I10 Essential (primary) hypertension: Secondary | ICD-10-CM | POA: Diagnosis not present

## 2016-02-17 DIAGNOSIS — I5032 Chronic diastolic (congestive) heart failure: Secondary | ICD-10-CM | POA: Insufficient documentation

## 2016-02-17 DIAGNOSIS — R531 Weakness: Secondary | ICD-10-CM | POA: Diagnosis not present

## 2016-02-17 DIAGNOSIS — Z87891 Personal history of nicotine dependence: Secondary | ICD-10-CM | POA: Insufficient documentation

## 2016-02-17 DIAGNOSIS — Z794 Long term (current) use of insulin: Secondary | ICD-10-CM | POA: Diagnosis not present

## 2016-02-17 DIAGNOSIS — J9610 Chronic respiratory failure, unspecified whether with hypoxia or hypercapnia: Secondary | ICD-10-CM | POA: Insufficient documentation

## 2016-02-17 DIAGNOSIS — Z79899 Other long term (current) drug therapy: Secondary | ICD-10-CM | POA: Insufficient documentation

## 2016-02-17 DIAGNOSIS — F419 Anxiety disorder, unspecified: Secondary | ICD-10-CM | POA: Diagnosis not present

## 2016-02-17 MED ORDER — TORSEMIDE 20 MG PO TABS: 40.0000 mg | ORAL_TABLET | Freq: Two times a day (BID) | ORAL | 3 refills | Status: DC

## 2016-02-17 MED ORDER — HYDRALAZINE HCL 50 MG PO TABS: 50.0000 mg | ORAL_TABLET | Freq: Three times a day (TID) | ORAL | 3 refills | Status: DC

## 2016-02-17 NOTE — Progress Notes (Signed)
Advanced Heart Failure Clinic Note   Referring Physician: Dr. Ree Kida Primary Physician: Dr. Salvadore Oxford Primary Cardiologist:  Dr. Clayburn Pert HF: Dr. Haroldine Laws   HPI:  Melissa Powers is a 70 y.o.  female with PMH of HFpEF, Afib on anticoagulation, CKD Stage 3, T2DM, HTN, HLD, and Parkinson's disease.  She had previously followed with Cardiology in [place] and was a part of a ReDS Vest Program with Dr. Haroldine Laws providing direction as well.  Admitted 8/1 -10/22/15 with worsening DOE and generalized weakness despite adjustment of outpatient diuretics.  CXR with CHF with mild interstitial edema. She was treated with IV diuretics with resolution of symptoms. Discharge weight 182 lbs. VEST reading on d/c 43% suggesting residual volume overload.   She returns today for follow up. Since the last visit she ablation for A fib.Melissa Powers was discharged off hydralazine.  Overall feeling ok. SOB with exertion. Denies PND/Orthopnea. Taking all medications.    Echo 10/20/15 with LVEF 55-60%, mild/mod MR, Mod LAE, mild RVH, mild RAE, PA peak pressure 69 mm Hg  Past Medical History:  Diagnosis Date  . Anxiety   . Chronic kidney disease (CKD), stage III (moderate)   . COPD (chronic obstructive pulmonary disease) ([place])   . Diastolic dysfunction with chronic heart failure ([place])   . Hyperlipidemia   . Hypertension   . Parkinson's disease ([place])    with deep brain stimulator  . Persistent atrial fibrillation ([place])   . Pneumonia 08/2015  . Sleep apnea    "tried mask; can't wear it" (10/19/2015)  . Type II diabetes mellitus ([place])     Current Outpatient Prescriptions  Medication Sig Dispense Refill  . amiodarone (PACERONE) 200 MG tablet Take 0.5 tablets (100 mg total) by mouth daily.    Marland Kitchen atorvastatin (LIPITOR) 20 MG tablet Take 10 mg by mouth daily.    . carbidopa-levodopa (SINEMET IR) 25-100 MG per tablet Take 1 tablet by mouth 3 (three) times daily.    . cholecalciferol (VITAMIN D) 1000 units tablet  Take 2,000 Units by mouth daily.    . citalopram (CELEXA) 20 MG tablet Take 20 mg by mouth daily.    Marland Kitchen gabapentin (NEURONTIN) 100 MG capsule Take 100 mg by mouth daily.    . hydrALAZINE (APRESOLINE) 50 MG tablet Take 0.5 tablets (25 mg total) by mouth 3 (three) times daily.    . insulin glargine (LANTUS) 100 UNIT/ML injection Inject 10 Units into the skin at bedtime.    . meclizine (ANTIVERT) 12.5 MG tablet Take 1 tablet (12.5 mg total) by mouth 2 (two) times daily as needed for dizziness. 30 tablet 0  . Rivaroxaban (XARELTO) 15 MG TABS tablet Take 15 mg by mouth daily with supper.    Marland Kitchen rOPINIRole (REQUIP) 0.25 MG tablet Take 2 tablets (0.5 mg total) by mouth 2 (two) times daily. 30 tablet 0  . torsemide (DEMADEX) 20 MG tablet Take 40mg  by mouth in the morning and 20mg  in the evening 150 tablet 3  . traZODone (DESYREL) 50 MG tablet Take 50 mg by mouth at bedtime.     . valsartan (DIOVAN) 320 MG tablet Take 1 tablet (320 mg total) by mouth daily.    . phenol (CHLORASEPTIC) 1.4 % LIQD Use as directed 1 spray in the mouth or throat as needed for throat irritation / pain. (Patient not taking: Reported on 02/17/2016)  0  . potassium chloride SA (K-DUR,KLOR-CON) 20 MEQ tablet Take 1 tablet (20 mEq total) by mouth once a week. On Thursday with  Metolazone (Patient not taking: Reported on 02/17/2016) 5 tablet 3   No current facility-administered medications for this encounter.    Facility-Administered Medications Ordered in Other Encounters  Medication Dose Route Frequency Provider Last Rate Last Dose  . 0.9 %  sodium chloride infusion    Continuous PRN Neldon Newport, CRNA 0 mL/hr at 12/03/15 1156      Allergies  Allergen Reactions  . Morphine And Related Other (See Comments)    Pt reports intolerance due to sedation from morphine      Social History   Social History  . Marital status: Married    Spouse name: N/A  . Number of children: N/A  . Years of education: N/A   Occupational  History  . Not on file.   Social History Main Topics  . Smoking status: Former Smoker    Quit date: 12/31/1983  . Smokeless tobacco: Never Used  . Alcohol use No  . Drug use: No  . Sexual activity: Not Currently   Other Topics Concern  . Not on file   Social History Narrative   Pt lives in [place] with spouse.  Retired Quarry manager.    Vitals:   02/17/16 1428  BP: (!) 180/84  Pulse: 73  SpO2: 98%  Weight: 197 lb (89.4 kg)   Wt Readings from Last 3 Encounters:  02/17/16 197 lb (89.4 kg)  02/08/16 203 lb (92.1 kg)  02/03/16 190 lb (86.2 kg)     PHYSICAL EXAM: General:  Elderly appearing, resting tremor HEENT: normal Neck: supple. JVD ~7-8 cm,  Carotids 2+ bilat; no bruits. No thyromegaly or nodule noted.  Cor: PMI nondisplaced. Regular, slightly brady. 2/6 TR   Lungs: CTAB, normal effort Abdomen: soft, NT, ND, no HSM. No bruits or masses. +BS  Extremities: no cyanosis, clubbing, rash. Trace ankle edema.  Neuro: alert & oriented x 3, cranial nerves grossly intact. moves all 4 extremities w/o difficulty. Affect pleasant.   Assessment/Plan    1. Chronic diastolic CHF - Echo 123456 LVEF 55-60% Increase torsemide to 40 mg twice a day. Check BMET in 1 week.  2. Chronic respiratory failure 3. Paroxysmal Afib- S/P ablation  4. 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 HTN Increase hydralazine to 50 mg three times a day  7. HLD-Per PCP 8. Anxiety- Per PCP 9. Parkinsons with falls and weakness.  Darrick Grinder, NP-C  Patient seen and examined with Darrick Grinder, NP. We discussed all aspects of the encounter. I agree with the assessment and plan as stated above.   She is much improved after AF ablation. Maintaining NSR. Volume status mildly elevated. Increase torsemide to 40 bid. BP also markedly elevated. Will increase hydralazine back to 50 tid.   Chloris Marcoux,MD 7:51 PM

## 2016-02-17 NOTE — Patient Instructions (Signed)
Increase Hydralazine to 50 mg (1 Tab) Three Times Daily  Increase Torsemide to 40 mg (2 Tabs) Two Times Daily  Follow up in 3 Months

## 2016-03-03 ENCOUNTER — Encounter (HOSPITAL_COMMUNITY): Payer: Self-pay | Admitting: Nurse Practitioner

## 2016-03-03 ENCOUNTER — Ambulatory Visit (HOSPITAL_COMMUNITY)
Admit: 2016-03-03 | Discharge: 2016-03-03 | Disposition: A | Discharge status: Home / Self Care | Payer: Medicare (Managed Care) | Attending: Nurse Practitioner | Admitting: Nurse Practitioner

## 2016-03-03 VITALS — BP 136/84 | HR 71 | Ht 63.0 in | Wt 195.0 lb

## 2016-03-03 DIAGNOSIS — F419 Anxiety disorder, unspecified: Secondary | ICD-10-CM | POA: Diagnosis not present

## 2016-03-03 DIAGNOSIS — E785 Hyperlipidemia, unspecified: Secondary | ICD-10-CM | POA: Insufficient documentation

## 2016-03-03 DIAGNOSIS — Z841 Family history of disorders of kidney and ureter: Secondary | ICD-10-CM | POA: Diagnosis not present

## 2016-03-03 DIAGNOSIS — I129 Hypertensive chronic kidney disease with stage 1 through stage 4 chronic kidney disease, or unspecified chronic kidney disease: Secondary | ICD-10-CM | POA: Diagnosis not present

## 2016-03-03 DIAGNOSIS — Z885 Allergy status to narcotic agent status: Secondary | ICD-10-CM | POA: Diagnosis not present

## 2016-03-03 DIAGNOSIS — G4730 Sleep apnea, unspecified: Secondary | ICD-10-CM | POA: Diagnosis not present

## 2016-03-03 DIAGNOSIS — I5032 Chronic diastolic (congestive) heart failure: Secondary | ICD-10-CM | POA: Insufficient documentation

## 2016-03-03 DIAGNOSIS — Z8489 Family history of other specified conditions: Secondary | ICD-10-CM | POA: Diagnosis not present

## 2016-03-03 DIAGNOSIS — I481 Persistent atrial fibrillation: Secondary | ICD-10-CM | POA: Diagnosis present

## 2016-03-03 DIAGNOSIS — Z79899 Other long term (current) drug therapy: Secondary | ICD-10-CM | POA: Diagnosis not present

## 2016-03-03 DIAGNOSIS — Z7901 Long term (current) use of anticoagulants: Secondary | ICD-10-CM | POA: Diagnosis not present

## 2016-03-03 DIAGNOSIS — E1122 Type 2 diabetes mellitus with diabetic chronic kidney disease: Secondary | ICD-10-CM | POA: Insufficient documentation

## 2016-03-03 DIAGNOSIS — G20 Parkinson's disease: Secondary | ICD-10-CM | POA: Insufficient documentation

## 2016-03-03 DIAGNOSIS — Z87891 Personal history of nicotine dependence: Secondary | ICD-10-CM | POA: Diagnosis not present

## 2016-03-03 DIAGNOSIS — I4819 Other persistent atrial fibrillation: Secondary | ICD-10-CM

## 2016-03-03 DIAGNOSIS — N183 Chronic kidney disease, stage 3 (moderate): Secondary | ICD-10-CM | POA: Insufficient documentation

## 2016-03-03 DIAGNOSIS — Z9889 Other specified postprocedural states: Secondary | ICD-10-CM | POA: Insufficient documentation

## 2016-03-03 DIAGNOSIS — J449 Chronic obstructive pulmonary disease, unspecified: Secondary | ICD-10-CM | POA: Insufficient documentation

## 2016-03-03 DIAGNOSIS — Z823 Family history of stroke: Secondary | ICD-10-CM | POA: Insufficient documentation

## 2016-03-03 DIAGNOSIS — Z794 Long term (current) use of insulin: Secondary | ICD-10-CM | POA: Insufficient documentation

## 2016-03-03 NOTE — Progress Notes (Signed)
Primary Care Physician: Maris Berger, MD Referring Physician: Dr. Aurea Graff Melissa Powers is a 70 y.o. female with a h/o persistent atrial fibrillation and  had failed medical therapy with amiodarone.The patient was admitted and underwent EPS/RFCA of atrial fibrillation . She was admitted 11/16 and was monitored on telemetry overnight which demonstrated sinus rhythm with PAC's.  Groin was without complication on the day of discharge.  The patient had hypotension during hospitalization and torsemide was held and restarted low dose at 20 mg a day, metolazone  and hydralazine were stopped.  She is the afib clinic today for f/u. She denies any swallowing difficulties or groin pain. She is short of breath on exertion and is having to prop herself up in bed with pillows. Her weight is up 8 lbs from discharge and BP today at 198/82. She has not noted any afib.  F/u in afib clinc. Her weight Is improved at 195 lbs. She is back on her usual diuretic dose. Hydralazine is also back to usual dose and BP well controlled. She has only noted about 3 episodes of afib that lasted less than 45 mis since ablation. She was hoping that she would feel significantly better in SR which she has not noted to be the case. Continues to have fatigue which is probable multifactorial.   Today, she denies symptoms of palpitations, chest pain,PND,  dizziness, presyncope, syncope, or neurologic sequela. Positive for orthopnea, pedal edema and shortness of breath, increase in weight. The patient is tolerating medications without difficulties and is otherwise without complaint today.   Past Medical History:  Diagnosis Date  . Anxiety   . Chronic kidney disease (CKD), stage III (moderate)   . COPD (chronic obstructive pulmonary disease) ([place])   . Diastolic dysfunction with chronic heart failure ([place])   . Hyperlipidemia   . Hypertension   . Parkinson's disease ([place])    with deep brain stimulator  . Persistent atrial  fibrillation ([place])   . Pneumonia 08/2015  . Sleep apnea    "tried mask; can't wear it" (10/19/2015)  . Type II diabetes mellitus ([place])    Past Surgical History:  Procedure Laterality Date  . ABDOMINAL HYSTERECTOMY    . CARDIOVERSION N/A 12/22/2015   Procedure: CARDIOVERSION;  Surgeon: Jolaine Artist, MD;  Location: [facility] ENDOSCOPY;  Service: Cardiovascular;  Laterality: N/A;  . DEEP BRAIN STIMULATOR PLACEMENT  01/2015   for Parikinsons treatment  . ELECTROPHYSIOLOGIC STUDY N/A 02/03/2016   Procedure: Atrial Fibrillation Ablation;  Surgeon: Thompson Grayer, MD;  Location: [facility] CV LAB;  Service: Cardiovascular;  Laterality: N/A;  . TEE WITHOUT CARDIOVERSION N/A 02/03/2016   Procedure: TRANSESOPHAGEAL ECHOCARDIOGRAM (TEE);  Surgeon: Larey Dresser, MD;  Location: [facility];  Service: Cardiovascular;  Laterality: N/A;  . TUBAL LIGATION      Current Outpatient Prescriptions  Medication Sig Dispense Refill  . amiodarone (PACERONE) 200 MG tablet Take 0.5 tablets (100 mg total) by mouth daily.    Marland Kitchen atorvastatin (LIPITOR) 20 MG tablet Take 10 mg by mouth daily.    . carbidopa-levodopa (SINEMET IR) 25-100 MG per tablet Take 1 tablet by mouth 3 (three) times daily.    . cholecalciferol (VITAMIN D) 1000 units tablet Take 2,000 Units by mouth daily.    . citalopram (CELEXA) 20 MG tablet Take 20 mg by mouth daily.    Marland Kitchen gabapentin (NEURONTIN) 100 MG capsule Take 100 mg by mouth daily.    . hydrALAZINE (APRESOLINE) 50 MG tablet  Take 1 tablet (50 mg total) by mouth 3 (three) times daily. 90 tablet 3  . insulin glargine (LANTUS) 100 UNIT/ML injection Inject 10 Units into the skin at bedtime.    . meclizine (ANTIVERT) 12.5 MG tablet Take 1 tablet (12.5 mg total) by mouth 2 (two) times daily as needed for dizziness. 30 tablet 0  . phenol (CHLORASEPTIC) 1.4 % LIQD Use as directed 1 spray in the mouth or throat as needed for throat irritation / pain.  0  . potassium chloride SA (K-DUR,KLOR-CON) 20  MEQ tablet Take 1 tablet (20 mEq total) by mouth once a week. On Thursday with Metolazone 5 tablet 3  . Rivaroxaban (XARELTO) 15 MG TABS tablet Take 15 mg by mouth daily with supper.    Marland Kitchen rOPINIRole (REQUIP) 0.25 MG tablet Take 2 tablets (0.5 mg total) by mouth 2 (two) times daily. 30 tablet 0  . torsemide (DEMADEX) 20 MG tablet Take 2 tablets (40 mg total) by mouth 2 (two) times daily. 120 tablet 3  . traZODone (DESYREL) 50 MG tablet Take 50 mg by mouth at bedtime.     . valsartan (DIOVAN) 320 MG tablet Take 1 tablet (320 mg total) by mouth daily.     No current facility-administered medications for this encounter.    Facility-Administered Medications Ordered in Other Encounters  Medication Dose Route Frequency Provider Last Rate Last Dose  . 0.9 %  sodium chloride infusion    Continuous PRN Neldon Newport, CRNA 0 mL/hr at 12/03/15 1156      Allergies  Allergen Reactions  . Morphine And Related Other (See Comments)    Pt reports intolerance due to sedation from morphine    Social History   Social History  . Marital status: Married    Spouse name: N/A  . Number of children: N/A  . Years of education: N/A   Occupational History  . Not on file.   Social History Main Topics  . Smoking status: Former Smoker    Quit date: 12/31/1983  . Smokeless tobacco: Never Used  . Alcohol use No  . Drug use: No  . Sexual activity: Not Currently   Other Topics Concern  . Not on file   Social History Narrative   Pt lives in [place] with spouse.  Retired Quarry manager.    Family History  Problem Relation Age of Onset  . Dementia Mother   . Heart Problems Father   . Stroke Sister   . Kidney failure Brother     on ESRD    ROS- All systems are reviewed and negative except as per the HPI above  Physical Exam: Vitals:   03/03/16 0910  BP: 136/84  Pulse: 71  Weight: 195 lb (88.5 kg)  Height: 5\' 3"  (1.6 m)    GEN- The patient is well appearing, alert and oriented x 3 today.   Head-  normocephalic, atraumatic Eyes-  Sclera clear, conjunctiva pink Ears- hearing intact Oropharynx- clear Neck- supple, no JVP Lymph- no cervical lymphadenopathy Lungs- Clear to ausculation bilaterally, normal work of breathing Heart- Regular rate and rhythm, no murmurs, rubs or gallops, PMI not laterally displaced GI- soft, NT, ND, + BS Extremities- no clubbing, cyanosis, TED hose on with edematous LE's. MS- no significant deformity or atrophy Skin- no rash or lesion Psych- euthymic mood, full affect Neuro- strength and sensation are intact  EKG- Regular rhythm with short PR interval, appearing to be sinus,  p waves  inverted at times, rate 71 bpm Epic records  reviewed  Assessment and Plan: 1. Persistent afib s/p ablation Appears to be in SR Continue amiodarone at 100 mg daily Continue xarelto at 15 mg a day  2. HTN Controlled   3.Diastolic heart failure Fluid status is stable Continue diuretic    F/u with  Dr. Rayann Heman 2/21  Geroge Baseman. Shianne Zeiser, [facility] [address] [place],  [address] [phone]

## 2016-03-17 ENCOUNTER — Emergency Department (HOSPITAL_COMMUNITY): Payer: Medicare (Managed Care)

## 2016-03-17 ENCOUNTER — Emergency Department (HOSPITAL_COMMUNITY)
Admission: EM | Admit: 2016-03-17 | Discharge: 2016-03-17 | Disposition: A | Discharge status: Home / Self Care | Payer: Medicare (Managed Care) | Attending: Emergency Medicine | Admitting: Emergency Medicine

## 2016-03-17 ENCOUNTER — Encounter (HOSPITAL_COMMUNITY): Payer: Self-pay

## 2016-03-17 DIAGNOSIS — Z7901 Long term (current) use of anticoagulants: Secondary | ICD-10-CM | POA: Insufficient documentation

## 2016-03-17 DIAGNOSIS — N184 Chronic kidney disease, stage 4 (severe): Secondary | ICD-10-CM | POA: Insufficient documentation

## 2016-03-17 DIAGNOSIS — I130 Hypertensive heart and chronic kidney disease with heart failure and stage 1 through stage 4 chronic kidney disease, or unspecified chronic kidney disease: Secondary | ICD-10-CM | POA: Insufficient documentation

## 2016-03-17 DIAGNOSIS — E860 Dehydration: Secondary | ICD-10-CM | POA: Insufficient documentation

## 2016-03-17 DIAGNOSIS — J449 Chronic obstructive pulmonary disease, unspecified: Secondary | ICD-10-CM | POA: Insufficient documentation

## 2016-03-17 DIAGNOSIS — Z79899 Other long term (current) drug therapy: Secondary | ICD-10-CM | POA: Diagnosis not present

## 2016-03-17 DIAGNOSIS — Z794 Long term (current) use of insulin: Secondary | ICD-10-CM | POA: Diagnosis not present

## 2016-03-17 DIAGNOSIS — R0789 Other chest pain: Secondary | ICD-10-CM | POA: Diagnosis present

## 2016-03-17 DIAGNOSIS — G20 Parkinson's disease: Secondary | ICD-10-CM | POA: Insufficient documentation

## 2016-03-17 DIAGNOSIS — E1122 Type 2 diabetes mellitus with diabetic chronic kidney disease: Secondary | ICD-10-CM | POA: Diagnosis not present

## 2016-03-17 DIAGNOSIS — R7989 Other specified abnormal findings of blood chemistry: Secondary | ICD-10-CM

## 2016-03-17 DIAGNOSIS — R392 Extrarenal uremia: Secondary | ICD-10-CM | POA: Insufficient documentation

## 2016-03-17 DIAGNOSIS — I5032 Chronic diastolic (congestive) heart failure: Secondary | ICD-10-CM | POA: Insufficient documentation

## 2016-03-17 DIAGNOSIS — Z87891 Personal history of nicotine dependence: Secondary | ICD-10-CM | POA: Diagnosis not present

## 2016-03-17 LAB — BASIC METABOLIC PANEL
Anion gap: 13 (ref 5–15)
BUN: 75 mg/dL — ABNORMAL HIGH (ref 6–20)
CHLORIDE: 94 mmol/L — AB (ref 101–111)
CO2: 27 mmol/L (ref 22–32)
CREATININE: 2.32 mg/dL — AB (ref 0.44–1.00)
Calcium: 9.1 mg/dL (ref 8.9–10.3)
GFR calc non Af Amer: 20 mL/min — ABNORMAL LOW (ref >60)
GFR, EST AFRICAN AMERICAN: 23 mL/min — AB (ref >60)
Glucose, Bld: 169 mg/dL — ABNORMAL HIGH (ref 65–99)
Potassium: 4.2 mmol/L (ref 3.5–5.1)
Sodium: 134 mmol/L — ABNORMAL LOW (ref 135–145)

## 2016-03-17 LAB — CBC
HCT: 34.1 % — ABNORMAL LOW (ref 36.0–46.0)
HEMOGLOBIN: 10.7 g/dL — AB (ref 12.0–15.0)
MCH: 26.6 pg (ref 26.0–34.0)
MCHC: 31.4 g/dL (ref 30.0–36.0)
MCV: 84.8 fL (ref 78.0–100.0)
Platelets: 231 10*3/uL (ref 150–400)
RBC: 4.02 MIL/uL (ref 3.87–5.11)
RDW: 14.7 % (ref 11.5–15.5)
WBC: 7.1 10*3/uL (ref 4.0–10.5)

## 2016-03-17 LAB — I-STAT TROPONIN, ED: Troponin i, poc: 0 ng/mL (ref 0.00–0.08)

## 2016-03-17 LAB — TROPONIN I

## 2016-03-17 LAB — BRAIN NATRIURETIC PEPTIDE: B Natriuretic Peptide: 41.5 pg/mL (ref 0.0–100.0)

## 2016-03-17 LAB — MAGNESIUM: MAGNESIUM: 2.7 mg/dL — AB (ref 1.7–2.4)

## 2016-03-17 MED ORDER — SODIUM CHLORIDE 0.9 % IV BOLUS (SEPSIS)
250.0000 mL | Freq: Once | INTRAVENOUS | Status: AC
  Administered 2016-03-17: 250 mL via INTRAVENOUS

## 2016-03-17 NOTE — ED Notes (Signed)
Care handoff to [place], [place]

## 2016-03-17 NOTE — ED Provider Notes (Signed)
[facility] DEPT Provider Note   CSN: [id number] Arrival date & time: 03/17/16  1359     History   Chief Complaint Chief Complaint  Patient presents with  . Congestive Heart Failure    HPI Melissa Powers is a 70 y.o. female.  HPI 70 year old female with past medical history of CHF, COPD, CK D, A. fib, who presents with a 4 pound weight gain and chest pressure. Patient states that she was recently taken off of her torsemide for the last 2 days due to worsening kidney function. Over the last 24 hours, she has reportedly gained 4 pounds and endorses worsening shortness of breath, constant chest pressure, dyspnea on exertion, and orthopnea. She was evaluated in her nursing facility today and sent here for evaluation. She denies any significant leg swelling. She denies any other medication changes. She has been eating and drinking normally. Denies any recent fevers or chills. She does have a nonproductive cough. Denies any hemoptysis. No history of blood clots.  Past Medical History:  Diagnosis Date  . Anxiety   . CHF (congestive heart failure) ([place])   . Chronic kidney disease (CKD), stage III (moderate)   . COPD (chronic obstructive pulmonary disease) ([place])   . Diastolic dysfunction with chronic heart failure ([place])   . Hyperlipidemia   . Hypertension   . Parkinson's disease ([place])    with deep brain stimulator  . Persistent atrial fibrillation ([place])   . Pneumonia 08/2015  . Sleep apnea    "tried mask; can't wear it" (10/19/2015)  . Type II diabetes mellitus [facility])     Patient Active Problem List   Diagnosis Date Noted  . A-fib ([place]) 02/03/2016  . Sepsis, unspecified organism ([place]) 12/20/2015  . Paroxysmal atrial fibrillation ([place]) 12/20/2015  . Atrial fibrillation with RVR ([place]) 12/20/2015  . Chronic respiratory failure ([place]) 11/09/2015  . CKD (chronic kidney disease) stage 4, GFR 15-29 ml/min (HCC) 11/09/2015  . Diabetes mellitus type 2, controlled ([place]) 11/09/2015  .  Essential hypertension   . Parkinson's disease [facility])     Past Surgical History:  Procedure Laterality Date  . ABDOMINAL HYSTERECTOMY    . CARDIOVERSION N/A 12/22/2015   Procedure: CARDIOVERSION;  Surgeon: Jolaine Artist, MD;  Location: [facility] ENDOSCOPY;  Service: Cardiovascular;  Laterality: N/A;  . DEEP BRAIN STIMULATOR PLACEMENT  01/2015   for Parikinsons treatment  . ELECTROPHYSIOLOGIC STUDY N/A 02/03/2016   Procedure: Atrial Fibrillation Ablation;  Surgeon: Thompson Grayer, MD;  Location: [facility] CV LAB;  Service: Cardiovascular;  Laterality: N/A;  . TEE WITHOUT CARDIOVERSION N/A 02/03/2016   Procedure: TRANSESOPHAGEAL ECHOCARDIOGRAM (TEE);  Surgeon: Larey Dresser, MD;  Location: [facility];  Service: Cardiovascular;  Laterality: N/A;  . TUBAL LIGATION      OB History    No data available       Home Medications    Prior to Admission medications   Medication Sig Start Date End Date Taking? Authorizing Provider  amiodarone (PACERONE) 200 MG tablet Take 0.5 tablets (100 mg total) by mouth daily. 02/04/16   Amber Sena Slate, NP  atorvastatin (LIPITOR) 20 MG tablet Take 10 mg by mouth daily.    Historical Provider, MD  carbidopa-levodopa (SINEMET IR) 25-100 MG per tablet Take 1 tablet by mouth 3 (three) times daily.    Historical Provider, MD  cholecalciferol (VITAMIN D) 1000 units tablet Take 2,000 Units by mouth daily.    Historical Provider, MD  citalopram (CELEXA) 20 MG tablet Take 20 mg by mouth daily.  Historical Provider, MD  gabapentin (NEURONTIN) 100 MG capsule Take 100 mg by mouth daily.    Historical Provider, MD  hydrALAZINE (APRESOLINE) 50 MG tablet Take 1 tablet (50 mg total) by mouth 3 (three) times daily. 02/17/16   Jolaine Artist, MD  insulin glargine (LANTUS) 100 UNIT/ML injection Inject 10 Units into the skin at bedtime. 02/03/16   Historical Provider, MD  meclizine (ANTIVERT) 12.5 MG tablet Take 1 tablet (12.5 mg total) by mouth 2 (two) times daily as  needed for dizziness. 12/23/15   Maryann Mikhail, DO  phenol (CHLORASEPTIC) 1.4 % LIQD Use as directed 1 spray in the mouth or throat as needed for throat irritation / pain. 02/04/16   Amber Sena Slate, NP  potassium chloride SA (K-DUR,KLOR-CON) 20 MEQ tablet Take 1 tablet (20 mEq total) by mouth once a week. On Thursday with Metolazone 01/05/16   Larey Dresser, MD  Rivaroxaban (XARELTO) 15 MG TABS tablet Take 15 mg by mouth daily with supper.    Historical Provider, MD  rOPINIRole (REQUIP) 0.25 MG tablet Take 2 tablets (0.5 mg total) by mouth 2 (two) times daily. 02/04/16   Larey Dresser, MD  traZODone (DESYREL) 50 MG tablet Take 50 mg by mouth at bedtime.     Historical Provider, MD  valsartan (DIOVAN) 320 MG tablet Take 1 tablet (320 mg total) by mouth daily. 02/04/16   Patsey Berthold, NP    Family History Family History  Problem Relation Age of Onset  . Dementia Mother   . Heart Problems Father   . Stroke Sister   . Kidney failure Brother     on ESRD    Social History Social History  Substance Use Topics  . Smoking status: Former Smoker    Quit date: 12/31/1983  . Smokeless tobacco: Never Used  . Alcohol use No     Allergies   Morphine and related   Review of Systems Review of Systems  Constitutional: Positive for fatigue. Negative for chills and fever.  HENT: Negative for congestion, rhinorrhea and sore throat.   Eyes: Negative for visual disturbance.  Respiratory: Positive for cough, chest tightness and shortness of breath. Negative for wheezing.   Cardiovascular: Positive for chest pain. Negative for leg swelling.  Gastrointestinal: Negative for abdominal pain, diarrhea, nausea and vomiting.  Genitourinary: Negative for dysuria, flank pain, vaginal bleeding and vaginal discharge.  Musculoskeletal: Negative for neck pain and neck stiffness.  Skin: Negative for rash.  Allergic/Immunologic: Negative for immunocompromised state.  Neurological: Negative for syncope,  weakness and headaches.  Hematological: Does not bruise/bleed easily.  All other systems reviewed and are negative.    Physical Exam Updated Vital Signs BP 126/56   Pulse 62   Temp 98 F (36.7 C) (Oral)   Resp 18   SpO2 98%   Physical Exam  Constitutional: She is oriented to person, place, and time. She appears well-developed and well-nourished. No distress.  HENT:  Head: Normocephalic and atraumatic.  Dry mucous membranes  Eyes: Conjunctivae are normal.  Neck: Neck supple.  Cardiovascular: Normal rate, regular rhythm and normal heart sounds.  Exam reveals no friction rub.   No murmur heard. Pulmonary/Chest: Effort normal. No respiratory distress. She has no wheezes. She has no rales.  Abdominal: Soft. She exhibits no distension. There is no tenderness. There is no guarding.  Musculoskeletal: She exhibits no edema.  Neurological: She is alert and oriented to person, place, and time. She exhibits normal muscle tone.  Skin: Skin is  warm. Capillary refill takes less than 2 seconds.  Psychiatric: She has a normal mood and affect.  Nursing note and vitals reviewed.    ED Treatments / Results  Labs (all labs ordered are listed, but only abnormal results are displayed) Labs Reviewed  BASIC METABOLIC PANEL - Abnormal; Notable for the following:       Result Value   Sodium 134 (*)    Chloride 94 (*)    Glucose, Bld 169 (*)    BUN 75 (*)    Creatinine, Ser 2.32 (*)    GFR calc non Af Amer 20 (*)    GFR calc Af Amer 23 (*)    All other components within normal limits  CBC - Abnormal; Notable for the following:    Hemoglobin 10.7 (*)    HCT 34.1 (*)    All other components within normal limits  MAGNESIUM - Abnormal; Notable for the following:    Magnesium 2.7 (*)    All other components within normal limits  BRAIN NATRIURETIC PEPTIDE  TROPONIN I  I-STAT TROPOININ, ED    EKG  EKG Interpretation  Date/Time:  Friday March 17 2016 14:24:09 EST Ventricular Rate:   69 PR Interval:    QRS Duration: 94 QT Interval:  462 QTC Calculation: 495 R Axis:   -56 Text Interpretation:  ATRIAL PACED RHYTHM Abnormal left axis deviation No significant change since last tracing Confirmed by Jsean Taussig MD, Lysbeth Galas [phone]) on 03/17/2016 5:09:07 PM       Radiology Dg Chest 2 View  Result Date: 03/17/2016 CLINICAL DATA:  Shortness of breath, chest tightness over the last 3 days EXAM: CHEST  2 VIEW COMPARISON:  Portable chest x-ray of 01/26/2016 FINDINGS: No active infiltrate or effusion is seen. Mediastinal and hilar contours are unremarkable. The heart is borderline enlarged and stable. Battery pack overlies the left chest with electrode lead extending cephalad. IMPRESSION: No active cardiopulmonary disease. Electronically Signed   By: Ivar Drape M.D.   On: 03/17/2016 14:49    Procedures Procedures (including critical care time)  Medications Ordered in ED Medications  sodium chloride 0.9 % bolus 250 mL (not administered)     Initial Impression / Assessment and Plan / ED Course  I have reviewed the triage vital signs and the nursing notes.  Pertinent labs & imaging results that were available during my care of the patient were reviewed by me and considered in my medical decision making (see chart for details).  Clinical Course     70 year old female with history of COPD, CHF, CAD who presents with a 4 pound weight gain over the last 48 hours as well as SOB. On arrival, vital signs are stable. EKG is at baseline. Initial lab work shows acute on chronic kidney injury with BUN 75, creatinine 2.32 consistent with possible prerenal azotemia. She did recently increase her diuretic but has held this over the last 2 days. Chest x-ray shows no evidence of pulmonary edema and she is not hypoxic. On exam, she appears dry with dry MM, no LE edema, no abdominal distension, no JVD. Suspect prerenal AKI in setting of diuresis, although treatment will be complicated as patient  also has weight gain. Will d/w Dr. Danelle Earthly, pt's cardiologist.  Discussed with Dr. Sung Amabile, who has reviewed records and visit. Appreciate reccs. He recommends holding diuretic for weekend and will see pt on Monday - does not feel pt requires admission and per records, she has had similar pre-renal AKIs in the past. 250 cc given  here and will d/c with home health nurse to see her tomorrow, f/u on Monday for repeat labs. Return precautions given. Offered admission and pt would like to d/c home, which is reasonable.  Final Clinical Impressions(s) / ED Diagnoses   Final diagnoses:  Prerenal azotemia  Dehydration    New Prescriptions Current Discharge Medication List       Duffy Bruce, MD 03/17/16 [phone]

## 2016-03-17 NOTE — ED Notes (Signed)
Lab at bedside

## 2016-03-17 NOTE — ED Triage Notes (Signed)
Pt reports she was sent here by home health nurse for increased fluid on the lungs. Pt reports worsening cough and dyspnea at home. Hx of CHF, pt of Dr. Jeffie Pollock.

## 2016-03-17 NOTE — Discharge Instructions (Signed)
STOP TAKING YOUR TORSEMIDE (DIURETIC) UNTIL YOU FOLLOW-UP WITH DR. Sung Amabile  YOUR LABS TODAY SHOW THAT YOU ARE DEHYDRATED. WE ARE GOING TO STOP YOUR DIURETIC FOR THIS WEEKEND TO SEE IF YOUR KIDNEYS IMPROVE. CALL DR. Sung Amabile WITH ANY QUESTIONS OR CONCERNS.

## 2016-03-17 NOTE — ED Notes (Signed)
ED Provider at bedside. 

## 2016-03-22 ENCOUNTER — Ambulatory Visit (HOSPITAL_COMMUNITY)
Admission: RE | Admit: 2016-03-22 | Discharge: 2016-03-22 | Disposition: A | Discharge status: Home / Self Care | Payer: Medicare (Managed Care) | Source: Ambulatory Visit | Attending: Cardiology | Admitting: Cardiology

## 2016-03-22 VITALS — BP 174/76 | HR 74 | Wt 200.8 lb

## 2016-03-22 DIAGNOSIS — Z885 Allergy status to narcotic agent status: Secondary | ICD-10-CM | POA: Insufficient documentation

## 2016-03-22 DIAGNOSIS — J449 Chronic obstructive pulmonary disease, unspecified: Secondary | ICD-10-CM | POA: Diagnosis not present

## 2016-03-22 DIAGNOSIS — N179 Acute kidney failure, unspecified: Secondary | ICD-10-CM | POA: Diagnosis not present

## 2016-03-22 DIAGNOSIS — G4730 Sleep apnea, unspecified: Secondary | ICD-10-CM | POA: Insufficient documentation

## 2016-03-22 DIAGNOSIS — N184 Chronic kidney disease, stage 4 (severe): Secondary | ICD-10-CM | POA: Insufficient documentation

## 2016-03-22 DIAGNOSIS — I10 Essential (primary) hypertension: Secondary | ICD-10-CM | POA: Diagnosis not present

## 2016-03-22 DIAGNOSIS — Z794 Long term (current) use of insulin: Secondary | ICD-10-CM | POA: Insufficient documentation

## 2016-03-22 DIAGNOSIS — R531 Weakness: Secondary | ICD-10-CM | POA: Diagnosis not present

## 2016-03-22 DIAGNOSIS — I5032 Chronic diastolic (congestive) heart failure: Secondary | ICD-10-CM | POA: Insufficient documentation

## 2016-03-22 DIAGNOSIS — I130 Hypertensive heart and chronic kidney disease with heart failure and stage 1 through stage 4 chronic kidney disease, or unspecified chronic kidney disease: Secondary | ICD-10-CM | POA: Insufficient documentation

## 2016-03-22 DIAGNOSIS — E785 Hyperlipidemia, unspecified: Secondary | ICD-10-CM | POA: Insufficient documentation

## 2016-03-22 DIAGNOSIS — Z79899 Other long term (current) drug therapy: Secondary | ICD-10-CM | POA: Diagnosis not present

## 2016-03-22 DIAGNOSIS — G20 Parkinson's disease: Secondary | ICD-10-CM | POA: Insufficient documentation

## 2016-03-22 DIAGNOSIS — E1122 Type 2 diabetes mellitus with diabetic chronic kidney disease: Secondary | ICD-10-CM | POA: Insufficient documentation

## 2016-03-22 DIAGNOSIS — I481 Persistent atrial fibrillation: Secondary | ICD-10-CM | POA: Diagnosis not present

## 2016-03-22 DIAGNOSIS — J9610 Chronic respiratory failure, unspecified whether with hypoxia or hypercapnia: Secondary | ICD-10-CM | POA: Insufficient documentation

## 2016-03-22 DIAGNOSIS — Z87891 Personal history of nicotine dependence: Secondary | ICD-10-CM | POA: Insufficient documentation

## 2016-03-22 DIAGNOSIS — F419 Anxiety disorder, unspecified: Secondary | ICD-10-CM | POA: Insufficient documentation

## 2016-03-22 DIAGNOSIS — I480 Paroxysmal atrial fibrillation: Secondary | ICD-10-CM

## 2016-03-22 LAB — BASIC METABOLIC PANEL
ANION GAP: 11 (ref 5–15)
BUN: 27 mg/dL — ABNORMAL HIGH (ref 6–20)
CHLORIDE: 101 mmol/L (ref 101–111)
CO2: 24 mmol/L (ref 22–32)
Calcium: 9.6 mg/dL (ref 8.9–10.3)
Creatinine, Ser: 1.52 mg/dL — ABNORMAL HIGH (ref 0.44–1.00)
GFR calc non Af Amer: 34 mL/min — ABNORMAL LOW (ref >60)
GFR, EST AFRICAN AMERICAN: 39 mL/min — AB (ref >60)
GLUCOSE: 226 mg/dL — AB (ref 65–99)
Potassium: 3.8 mmol/L (ref 3.5–5.1)
Sodium: 136 mmol/L (ref 135–145)

## 2016-03-22 LAB — BRAIN NATRIURETIC PEPTIDE: B Natriuretic Peptide: 190.7 pg/mL — ABNORMAL HIGH (ref 0.0–100.0)

## 2016-03-22 MED ORDER — TORSEMIDE 20 MG PO TABS: 20.0000 mg | ORAL_TABLET | ORAL | 3 refills | Status: DC

## 2016-03-22 MED ORDER — HYDRALAZINE HCL 50 MG PO TABS: 75.0000 mg | ORAL_TABLET | Freq: Three times a day (TID) | ORAL | 3 refills | Status: DC

## 2016-03-22 MED ORDER — POTASSIUM CHLORIDE CRYS ER 20 MEQ PO TBCR
20.0000 meq | EXTENDED_RELEASE_TABLET | ORAL | 3 refills | Status: AC
Start: 2016-03-22 — End: ?

## 2016-03-22 NOTE — Progress Notes (Signed)
Advanced Heart Failure Clinic Note   Referring Physician: Dr. Ree Kida Primary Physician: Dr. Salvadore Oxford Primary Cardiologist:  Dr. Clayburn Pert HF: Dr. Haroldine Laws   HPI:  Melissa Powers is a 71 y.o.  female with PMH of HFpEF, Afib on anticoagulation, CKD Stage 3, T2DM, HTN, HLD, and Parkinson's disease.  She had previously followed with Cardiology in [place] and was a part of a ReDS Vest Program with Dr. Haroldine Laws providing direction as well.  Admitted 8/1 -10/22/15 with worsening DOE and generalized weakness despite adjustment of outpatient diuretics.  CXR with CHF with mild interstitial edema. She was treated with IV diuretics with resolution of symptoms. Discharge weight 182 lbs. VEST reading on d/c 43% suggesting residual volume overload.   She returns today for follow up. Last visit hydralazine was increased to 50 mg three times a day. Also torsemide was increased 40 mg twice a day. Last week she went to [facility] ED with increase dyspnea. Creatinine was elevated and she was thought to be dry so diuretics were stopped. On going dyspnea with exertion. Denies PND/Orthopnea. Weight at home 196-200 pounds.Appetite ok. Taking all medications.   Echo 10/20/15 with LVEF 55-60%, mild/mod MR, Mod LAE, mild RVH, mild RAE, PA peak pressure 69 mm Hg  Past Medical History:  Diagnosis Date  . Anxiety   . CHF (congestive heart failure) ([place])   . Chronic kidney disease (CKD), stage III (moderate)   . COPD (chronic obstructive pulmonary disease) ([place])   . Diastolic dysfunction with chronic heart failure ([place])   . Hyperlipidemia   . Hypertension   . Parkinson's disease ([place])    with deep brain stimulator  . Persistent atrial fibrillation ([place])   . Pneumonia 08/2015  . Sleep apnea    "tried mask; can't wear it" (10/19/2015)  . Type II diabetes mellitus ([place])     Current Outpatient Prescriptions  Medication Sig Dispense Refill  . amiodarone (PACERONE) 200 MG tablet Take 0.5 tablets (100 mg total) by  mouth daily.    Marland Kitchen atorvastatin (LIPITOR) 20 MG tablet Take 10 mg by mouth daily.    . carbidopa-levodopa (SINEMET IR) 25-100 MG per tablet Take 1 tablet by mouth 3 (three) times daily.    . cholecalciferol (VITAMIN D) 1000 units tablet Take 2,000 Units by mouth daily.    . citalopram (CELEXA) 20 MG tablet Take 20 mg by mouth daily.    Marland Kitchen gabapentin (NEURONTIN) 100 MG capsule Take 100 mg by mouth daily.    . hydrALAZINE (APRESOLINE) 50 MG tablet Take 1 tablet (50 mg total) by mouth 3 (three) times daily. 90 tablet 3  . insulin glargine (LANTUS) 100 UNIT/ML injection Inject 10 Units into the skin at bedtime.    . meclizine (ANTIVERT) 12.5 MG tablet Take 1 tablet (12.5 mg total) by mouth 2 (two) times daily as needed for dizziness. 30 tablet 0  . phenol (CHLORASEPTIC) 1.4 % LIQD Use as directed 1 spray in the mouth or throat as needed for throat irritation / pain.  0  . potassium chloride SA (K-DUR,KLOR-CON) 20 MEQ tablet Take 1 tablet (20 mEq total) by mouth once a week. On Thursday with Metolazone 5 tablet 3  . Rivaroxaban (XARELTO) 15 MG TABS tablet Take 15 mg by mouth daily with supper.    Marland Kitchen rOPINIRole (REQUIP) 0.25 MG tablet Take 2 tablets (0.5 mg total) by mouth 2 (two) times daily. 30 tablet 0  . traZODone (DESYREL) 50 MG tablet Take 50 mg by mouth at bedtime.     Marland Kitchen  valsartan (DIOVAN) 320 MG tablet Take 1 tablet (320 mg total) by mouth daily.     No current facility-administered medications for this encounter.    Facility-Administered Medications Ordered in Other Encounters  Medication Dose Route Frequency Provider Last Rate Last Dose  . 0.9 %  sodium chloride infusion    Continuous PRN Neldon Newport, CRNA 0 mL/hr at 12/03/15 1156      Allergies  Allergen Reactions  . Morphine And Related Other (See Comments)    Pt reports intolerance due to sedation from morphine      Social History   Social History  . Marital status: Married    Spouse name: N/A  . Number of children: N/A    . Years of education: N/A   Occupational History  . Not on file.   Social History Main Topics  . Smoking status: Former Smoker    Quit date: 12/31/1983  . Smokeless tobacco: Never Used  . Alcohol use No  . Drug use: No  . Sexual activity: Not Currently   Other Topics Concern  . Not on file   Social History Narrative   Pt lives in [place] with spouse.  Retired Quarry manager.    Vitals:   03/22/16 1527  BP: (!) 174/76  Pulse: 74  SpO2: 99%  Weight: 200 lb 12.8 oz (91.1 kg)   Wt Readings from Last 3 Encounters:  03/22/16 200 lb 12.8 oz (91.1 kg)  03/03/16 195 lb (88.5 kg)  02/17/16 197 lb (89.4 kg)    Reds Vest 36.   PHYSICAL EXAM: General:  Elderly appearing, resting tremor. Ambulated in the clinic without difficulty. Husband present.  HEENT: normal Neck: supple. JVD ~6-7 cm,  Carotids 2+ bilat; no bruits. No thyromegaly or nodule noted.  Cor: PMI nondisplaced. Regular, slightly brady. 2/6 TR   Lungs: CTAB, normal effort Abdomen: soft, NT, ND, no HSM. No bruits or masses. +BS  Extremities: no cyanosis, clubbing, rash. Trace ankle edema.  Neuro: alert & oriented x 3, cranial nerves grossly intact. moves all 4 extremities w/o difficulty. Affect pleasant.   Assessment/Plan    1. Chronic diastolic CHF - Echo 123456 LVEF 55-60% Off torsemide for few days due to AKI. Today she will try to take torsemide 20 mg every Friday + 20 meq of potassium.  Also will take torsemide + potassium on days her weight is 204 or greater.  Check BMET and BNP today.   2. Chronic respiratory failure- I ambulated in the clinic on room air. O2 sats remained >90%.  3. Paroxysmal Afib- S/P ablation  4. CKD stage III - IV- BMET today.  5. T2DM with neuropathy 6. HTN- elevated. Increase hydralazine to 75 mg three times a day  7. HLD-Per PCP 8. Anxiety- Per PCP 9. Parkinsons with falls and weakness.  Follow up in 4 weeks with Dr Haroldine Laws.   Chelsi Warr NP-C  4:38 PM   Darrick Grinder, NP-C

## 2016-03-22 NOTE — Patient Instructions (Signed)
INCREASE Hydralazine 75 mg , one and one half tab three times per day START Torsemide 20 mg, one tab weekly on Fridays and as needed for weight greater than 204 lbs START Potassium 20 mEq, one tab weekly on Fridays and as needed with Metolazone  Labs today We will only contact you if something comes back abnormal or we need to make some changes. Otherwise no news is good news!  Your physician recommends that you schedule a follow-up appointment in: 4 weeks with Amy Clegg,NP   Do the following things EVERYDAY: 1) Weigh yourself in the morning before breakfast. Write it down and keep it in a log. 2) Take your medicines as prescribed 3) Eat low salt foods-Limit salt (sodium) to 2000 mg per day.  4) Stay as active as you can everyday 5) Limit all fluids for the day to less than 2 liters

## 2016-04-20 ENCOUNTER — Ambulatory Visit (HOSPITAL_COMMUNITY)
Admission: RE | Admit: 2016-04-20 | Discharge: 2016-04-20 | Disposition: A | Discharge status: Home / Self Care | Payer: Medicare (Managed Care) | Source: Ambulatory Visit | Attending: Internal Medicine | Admitting: Internal Medicine

## 2016-04-20 ENCOUNTER — Encounter (HOSPITAL_COMMUNITY): Payer: Self-pay | Admitting: Internal Medicine

## 2016-04-20 VITALS — BP 158/70 | HR 78 | Wt 200.2 lb

## 2016-04-20 DIAGNOSIS — G20 Parkinson's disease: Secondary | ICD-10-CM | POA: Diagnosis not present

## 2016-04-20 DIAGNOSIS — G4730 Sleep apnea, unspecified: Secondary | ICD-10-CM | POA: Insufficient documentation

## 2016-04-20 DIAGNOSIS — I130 Hypertensive heart and chronic kidney disease with heart failure and stage 1 through stage 4 chronic kidney disease, or unspecified chronic kidney disease: Secondary | ICD-10-CM | POA: Insufficient documentation

## 2016-04-20 DIAGNOSIS — E785 Hyperlipidemia, unspecified: Secondary | ICD-10-CM | POA: Diagnosis not present

## 2016-04-20 DIAGNOSIS — Z87891 Personal history of nicotine dependence: Secondary | ICD-10-CM | POA: Diagnosis not present

## 2016-04-20 DIAGNOSIS — I4891 Unspecified atrial fibrillation: Secondary | ICD-10-CM

## 2016-04-20 DIAGNOSIS — Z9889 Other specified postprocedural states: Secondary | ICD-10-CM | POA: Diagnosis not present

## 2016-04-20 DIAGNOSIS — R531 Weakness: Secondary | ICD-10-CM | POA: Insufficient documentation

## 2016-04-20 DIAGNOSIS — R42 Dizziness and giddiness: Secondary | ICD-10-CM | POA: Diagnosis not present

## 2016-04-20 DIAGNOSIS — G20A1 Parkinson's disease without dyskinesia, without mention of fluctuations: Secondary | ICD-10-CM

## 2016-04-20 DIAGNOSIS — E1122 Type 2 diabetes mellitus with diabetic chronic kidney disease: Secondary | ICD-10-CM | POA: Insufficient documentation

## 2016-04-20 DIAGNOSIS — Z7901 Long term (current) use of anticoagulants: Secondary | ICD-10-CM | POA: Insufficient documentation

## 2016-04-20 DIAGNOSIS — Z794 Long term (current) use of insulin: Secondary | ICD-10-CM | POA: Insufficient documentation

## 2016-04-20 DIAGNOSIS — I10 Essential (primary) hypertension: Secondary | ICD-10-CM | POA: Diagnosis not present

## 2016-04-20 DIAGNOSIS — Z885 Allergy status to narcotic agent status: Secondary | ICD-10-CM | POA: Insufficient documentation

## 2016-04-20 DIAGNOSIS — J9610 Chronic respiratory failure, unspecified whether with hypoxia or hypercapnia: Secondary | ICD-10-CM | POA: Insufficient documentation

## 2016-04-20 DIAGNOSIS — I5032 Chronic diastolic (congestive) heart failure: Secondary | ICD-10-CM | POA: Insufficient documentation

## 2016-04-20 DIAGNOSIS — Z79899 Other long term (current) drug therapy: Secondary | ICD-10-CM | POA: Diagnosis not present

## 2016-04-20 DIAGNOSIS — I481 Persistent atrial fibrillation: Secondary | ICD-10-CM | POA: Diagnosis not present

## 2016-04-20 DIAGNOSIS — F419 Anxiety disorder, unspecified: Secondary | ICD-10-CM | POA: Diagnosis not present

## 2016-04-20 DIAGNOSIS — N184 Chronic kidney disease, stage 4 (severe): Secondary | ICD-10-CM | POA: Diagnosis not present

## 2016-04-20 DIAGNOSIS — J449 Chronic obstructive pulmonary disease, unspecified: Secondary | ICD-10-CM | POA: Diagnosis not present

## 2016-04-20 DIAGNOSIS — I480 Paroxysmal atrial fibrillation: Secondary | ICD-10-CM | POA: Insufficient documentation

## 2016-04-20 LAB — BASIC METABOLIC PANEL
Anion gap: 7 (ref 5–15)
BUN: 17 mg/dL (ref 6–20)
CALCIUM: 9.2 mg/dL (ref 8.9–10.3)
CO2: 25 mmol/L (ref 22–32)
Chloride: 104 mmol/L (ref 101–111)
Creatinine, Ser: 1.39 mg/dL — ABNORMAL HIGH (ref 0.44–1.00)
GFR calc Af Amer: 43 mL/min — ABNORMAL LOW (ref >60)
GFR calc non Af Amer: 37 mL/min — ABNORMAL LOW (ref >60)
GLUCOSE: 172 mg/dL — AB (ref 65–99)
Potassium: 4.3 mmol/L (ref 3.5–5.1)
SODIUM: 136 mmol/L (ref 135–145)

## 2016-04-20 LAB — CBC
HCT: 36.7 % (ref 36.0–46.0)
Hemoglobin: 11.3 g/dL — ABNORMAL LOW (ref 12.0–15.0)
MCH: 26.1 pg (ref 26.0–34.0)
MCHC: 30.8 g/dL (ref 30.0–36.0)
MCV: 84.8 fL (ref 78.0–100.0)
PLATELETS: 208 10*3/uL (ref 150–400)
RBC: 4.33 MIL/uL (ref 3.87–5.11)
RDW: 14.7 % (ref 11.5–15.5)
WBC: 6.8 10*3/uL (ref 4.0–10.5)

## 2016-04-20 NOTE — Progress Notes (Signed)
Advanced Heart Failure Clinic Note   Referring Physician: Dr. Ree Kida Primary Physician: Dr. Salvadore Oxford Primary Cardiologist:  Dr. Clayburn Pert HF: Dr. Haroldine Laws   HPI:  Melissa Powers is a 71 y.o.  female with PMH of HFpEF, Afib on anticoagulation, CKD Stage 3, T2DM, HTN, HLD, and Parkinson's disease.  She had previously followed with Cardiology in [place] and was a part of a [place].  Admitted 8/1 -10/22/15 with worsening DOE and generalized weakness despite adjustment of outpatient diuretics.  CXR with CHF with mild interstitial edema. She was treated with IV diuretics with resolution of symptoms. Discharge weight 182 lbs. VEST reading on d/c 43% suggesting residual volume overload.   She returns today for regular follow up. States she feels awful. Has felt very run down over these past few days. Eating one meal day.  Getting dizziness and lightheadedness when she stands up fast. Taking torsemide once weekly, has not needed any extra. Appetite low. Eats about one meal a day. Breathing has been a little labored. Gets SOB changing clothes and walking around the house. Weight at home stable around 197. Feels like she was in afib Monday for around 3 hours.   Echo 10/20/15 with LVEF 55-60%, mild/mod MR, Mod LAE, mild RVH, mild RAE, PA peak pressure 69 mm Hg  Past Medical History:  Diagnosis Date  . Anxiety   . CHF (congestive heart failure) ([place])   . Chronic kidney disease (CKD), stage III (moderate)   . COPD (chronic obstructive pulmonary disease) ([place])   . Diastolic dysfunction with chronic heart failure ([place])   . Hyperlipidemia   . Hypertension   . Parkinson's disease ([place])    with deep brain stimulator  . Persistent atrial fibrillation ([place])   . Pneumonia 08/2015  . Sleep apnea    "tried mask; can't wear it" (10/19/2015)  . Type II diabetes mellitus ([place])     Current Outpatient Prescriptions  Medication Sig Dispense Refill  . amiodarone (PACERONE) 200 MG tablet Take  0.5 tablets (100 mg total) by mouth daily.    Marland Kitchen atorvastatin (LIPITOR) 20 MG tablet Take 10 mg by mouth daily.    . carbidopa-levodopa (SINEMET IR) 25-100 MG per tablet Take 1 tablet by mouth 3 (three) times daily.    . cholecalciferol (VITAMIN D) 1000 units tablet Take 2,000 Units by mouth daily.    . citalopram (CELEXA) 20 MG tablet Take 20 mg by mouth daily.    Marland Kitchen gabapentin (NEURONTIN) 100 MG capsule Take 100 mg by mouth daily.    . hydrALAZINE (APRESOLINE) 50 MG tablet Take 1.5 tablets (75 mg total) by mouth 3 (three) times daily. 270 tablet 3  . insulin glargine (LANTUS) 100 UNIT/ML injection Inject 10 Units into the skin at bedtime.    . meclizine (ANTIVERT) 12.5 MG tablet Take 1 tablet (12.5 mg total) by mouth 2 (two) times daily as needed for dizziness. 30 tablet 0  . phenol (CHLORASEPTIC) 1.4 % LIQD Use as directed 1 spray in the mouth or throat as needed for throat irritation / pain.  0  . potassium chloride SA (K-DUR,KLOR-CON) 20 MEQ tablet Take 1 tablet (20 mEq total) by mouth once a week. ON FRIDAYS. AND AS NEEDED WITH METOLAZONE DOSE 10 tablet 3  . Rivaroxaban (XARELTO) 15 MG TABS tablet Take 15 mg by mouth daily with supper.    Marland Kitchen rOPINIRole (REQUIP) 0.25 MG tablet Take 2 tablets (0.5 mg total) by mouth 2 (two) times daily. 30 tablet 0  .  torsemide (DEMADEX) 20 MG tablet Take 1 tablet (20 mg total) by mouth once a week. ON FRIDAYS, MAY TAKE AND ADDITIONAL TAB AS NEEDED FOR WEIGHT GREATER THAN 204 LBS 10 tablet 3  . traZODone (DESYREL) 50 MG tablet Take 50 mg by mouth at bedtime.     . valsartan (DIOVAN) 320 MG tablet Take 1 tablet (320 mg total) by mouth daily.     No current facility-administered medications for this encounter.    Facility-Administered Medications Ordered in Other Encounters  Medication Dose Route Frequency Provider Last Rate Last Dose  . 0.9 %  sodium chloride infusion    Continuous PRN Neldon Newport, CRNA 0 mL/hr at 12/03/15 1156      Allergies  Allergen  Reactions  . Morphine And Related Other (See Comments)    Pt reports intolerance due to sedation from morphine      Social History   Social History  . Marital status: Married    Spouse name: N/A  . Number of children: N/A  . Years of education: N/A   Occupational History  . Not on file.   Social History Main Topics  . Smoking status: Former Smoker    Quit date: 12/31/1983  . Smokeless tobacco: Never Used  . Alcohol use No  . Drug use: No  . Sexual activity: Not Currently   Other Topics Concern  . Not on file   Social History Narrative   Pt lives in [place] with spouse.  Retired Quarry manager.    Vitals:   04/20/16 1359  BP: (!) 158/70  Pulse: 78  SpO2: 99%  Weight: 200 lb 4 oz (90.8 kg)   Wt Readings from Last 3 Encounters:  04/20/16 200 lb 4 oz (90.8 kg)  03/22/16 200 lb 12.8 oz (91.1 kg)  03/03/16 195 lb (88.5 kg)    PHYSICAL EXAM: General:  Elderly and fatigued appearing.  Walked into clinic without difficult. Husband present.  HEENT: Normal Neck: supple. JVD ~6-7 cm,  Carotids 2+ bilat; no bruits. No thyromegaly or nodule noted.  Cor: PMI nondisplaced. Regular, slightly brady. 2/6 TR   Lungs: CTAB, normal effort Abdomen: soft, NT, ND, no HSM. No bruits or masses. +BS  Extremities: no cyanosis, clubbing, rash. Trace ankle edema.  Neuro: Alert & oriented x 3, cranial nerves grossly intact. moves all 4 extremities w/o difficulty. Affect pleasant.   EKG - NSR 74 bpm. Significant artifact due to Deep brain stimulator.   Assessment/Plan    1. Chronic diastolic CHF - Echo 123456 LVEF 55-60% - Volume status looks stable on exam.  Weight stable. NYHA III-IIIb symptoms. Think deconditioning large/primary contributor at this time.  - Continue torsemide 20 mg every Friday + 20 meq of potassium.  Also will take torsemide + potassium on days her weight is 204 or greater.  Check BMET and BNP today.   2. Chronic respiratory failure - 02 stable on RA walking into clinic.    3. Paroxysmal Afib- S/P ablation  - NSR by EKG today.  4. CKD stage III - IV - Repeat BMET today.  5. T2DM with neuropathy 6. HTN - Elevated today but anxious.  With fatigue, will continue current regimen for now.  - Continue hydralazine 75 mg TID.  7. HLD-Per PCP 8. Anxiety- Per PCP 9. Parkinsons with falls and weakness. - Very deconditioned.  Needs rehab.  - Recommended she do Rehab via the [facility] program.  Shirley Friar, PA-C  2:51 PM  Patient seen and examined with Jonni Sanger  Tillery, PA-C. We discussed all aspects of the encounter. I agree with the assessment and plan as stated above.   Despite subjective complaints she actually looks quite good from cardiac standpoint. Volume status is table. Maintaining NSR after ablation. BP remains mild to moderately elevated. She is extremely deconditioned. She is currently participating in PACE program and have asked her to participate more in PT/rehab part of program.   Dorothie Wah,MD 9:31 PM

## 2016-04-20 NOTE — Patient Instructions (Signed)
Routine lab work today. Will notify you of abnormal results\  Follow up with Dr.Bensimhon in 2 months  

## 2016-04-21 ENCOUNTER — Other Ambulatory Visit (HOSPITAL_COMMUNITY): Payer: Self-pay | Admitting: Internal Medicine

## 2016-04-27 ENCOUNTER — Telehealth (HOSPITAL_COMMUNITY): Payer: Self-pay | Admitting: *Deleted

## 2016-04-27 NOTE — Telephone Encounter (Signed)
Records faxed to [facility] as requested.

## 2016-05-10 ENCOUNTER — Ambulatory Visit (INDEPENDENT_AMBULATORY_CARE_PROVIDER_SITE_OTHER): Payer: Medicare (Managed Care) | Admitting: Internal Medicine

## 2016-05-10 ENCOUNTER — Encounter (INDEPENDENT_AMBULATORY_CARE_PROVIDER_SITE_OTHER): Payer: Self-pay

## 2016-05-10 ENCOUNTER — Encounter: Payer: Self-pay | Admitting: Internal Medicine

## 2016-05-10 VITALS — BP 142/82 | HR 73 | Ht 63.5 in | Wt 191.2 lb

## 2016-05-10 DIAGNOSIS — I480 Paroxysmal atrial fibrillation: Secondary | ICD-10-CM

## 2016-05-10 DIAGNOSIS — I4819 Other persistent atrial fibrillation: Secondary | ICD-10-CM

## 2016-05-10 DIAGNOSIS — I481 Persistent atrial fibrillation: Secondary | ICD-10-CM

## 2016-05-10 NOTE — Progress Notes (Signed)
PCP: Marco Collie, MD Primary Cardiologist:  Dr Hulan Saas Melissa Powers is a 71 y.o. female who presents today for routine electrophysiology followup.   Since her recent ablation, the patient reports doing very well.  She denies procedure related complications.  She thinks she may still have afib, based on her pulse ox readings at home but does not appear to be having symptoms.  Today, she denies symptoms of palpitations, chest pain, shortness of breath,  lower extremity edema, dizziness, presyncope, or syncope.  The patient is otherwise without complaint today.   Past Medical History:  Diagnosis Date  . Anxiety   . CHF (congestive heart failure) ([place])   . Chronic kidney disease (CKD), stage III (moderate)   . COPD (chronic obstructive pulmonary disease) ([place])   . Diastolic dysfunction with chronic heart failure ([place])   . Hyperlipidemia   . Hypertension   . Parkinson's disease ([place])    with deep brain stimulator  . Persistent atrial fibrillation ([place])   . Pneumonia 08/2015  . Sleep apnea    "tried mask; can't wear it" (10/19/2015)  . Type II diabetes mellitus ([place])    Past Surgical History:  Procedure Laterality Date  . ABDOMINAL HYSTERECTOMY    . CARDIOVERSION N/A 12/22/2015   Procedure: CARDIOVERSION;  Surgeon: Jolaine Artist, MD;  [facility] END[facility]COPY;  Service: Cardiovascular;  Laterality: N/A;  . DEEP BRAIN STIMULATOR PLACEMENT  01/2015   for Parikinsons treatment  . ELECTROPHYSIOLOGIC STUDY N/A 02/03/2016   Procedure: Atrial Fibrillation Ablation;  Surgeon: Thompson Grayer, MD;  Location: [facility] CV LAB;  Service: Cardiovascular;  Laterality: N/A;  . TEE WITHOUT CARDIOVERSION N/A 02/03/2016   Procedure: TRANSESOPHAGEAL ECHOCARDIOGRAM (TEE);  Surgeon: Larey Dresser, MD;  Location: [facility];  Service: Cardiovascular;  Laterality: N/A;  . TUBAL LIGATION      ROS- all systems are reviewed and negatives except as per HPI above  Current Outpatient Prescriptions   Medication Sig Dispense Refill  . amiodarone (PACERONE) 200 MG tablet Take 0.5 tablets (100 mg total) by mouth daily.    Marland Kitchen atorvastatin (LIPITOR) 20 MG tablet Take 10 mg by mouth daily.    . carbidopa-levodopa (SINEMET IR) 25-100 MG per tablet Take 1 tablet by mouth 3 (three) times daily.    . cholecalciferol (VITAMIN D) 1000 units tablet Take 2,000 Units by mouth daily.    . citalopram (CELEXA) 20 MG tablet Take 20 mg by mouth daily.    Marland Kitchen gabapentin (NEURONTIN) 100 MG capsule Take 100 mg by mouth daily.    . hydrALAZINE (APRESOLINE) 50 MG tablet Take 1.5 tablets (75 mg total) by mouth 3 (three) times daily. 270 tablet 3  . insulin glargine (LANTUS) 100 UNIT/ML injection Inject 10 Units into the skin at bedtime.    . meclizine (ANTIVERT) 12.5 MG tablet Take 1 tablet (12.5 mg total) by mouth 2 (two) times daily as needed for dizziness. 30 tablet 0  . phenol (CHLORASEPTIC) 1.4 % LIQD Use as directed 1 spray in the mouth or throat as needed for throat irritation / pain.  0  . potassium chloride SA (K-DUR,KLOR-CON) 20 MEQ tablet Take 1 tablet (20 mEq total) by mouth once a week. ON FRIDAYS. AND AS NEEDED WITH METOLAZONE DOSE 10 tablet 3  . Rivaroxaban (XARELTO) 15 MG TABS tablet Take 15 mg by mouth daily with supper.    Marland Kitchen rOPINIRole (REQUIP) 0.25 MG tablet Take 2 tablets (0.5 mg total) by mouth 2 (two) times daily. 30 tablet 0  .  torsemide (DEMADEX) 20 MG tablet Take 1 tablet (20 mg total) by mouth once a week. ON FRIDAYS, MAY TAKE AND ADDITIONAL TAB AS NEEDED FOR WEIGHT GREATER THAN 204 LBS 10 tablet 3  . traZODone (DESYREL) 50 MG tablet Take 50 mg by mouth at bedtime.     . valsartan (DIOVAN) 320 MG tablet Take 1 tablet (320 mg total) by mouth daily.     No current facility-administered medications for this visit.    Facility-Administered Medications Ordered in Other Visits  Medication Dose Route Frequency Provider Last Rate Last Dose  . 0.9 %  sodium chloride infusion    Continuous PRN Neldon Newport, CRNA 0 mL/hr at 12/03/15 1156      Physical Exam: Vitals:   05/10/16 1113  BP: (!) 142/82  Pulse: 73  Weight: 191 lb 3.2 oz (86.7 kg)  Height: 5' 3.5" (1.613 m)    GEN- The patient is chronically ill appearing, alert and oriented x 3 today.  + parkinsons   Head- normocephalic, atraumatic Eyes-  Sclera clear, conjunctiva pink Ears- hearing intact Oropharynx- clear Lungs- Clear to ausculation bilaterally, normal work of breathing Heart- Regular rate and rhythm, no murmurs, rubs or gallops, PMI not laterally displaced GI- soft, NT, ND, + BS Extremities- no clubbing, cyanosis, or edema  ekg today reveals sinus rhythm Abstract from her neuro stimulator is noted  Assessment and Plan:  1. Persistent afib Doing well s/p ablation She thinks she may still have afib, based on her pulse ox readings at home but does not appear to be having symptoms. Previously, her afib was quite persistent.  She has been in sinus on af clinic follow-up, CHF clinic follow-up, and again today.  I am not convinced that she is having afib.  Ideally, I would favor long term monitoring.  Given her significant neurostimulator artifact, I do not feel that this is an option for her.  We will therefore stop amiodarone today and follow clinically.  Continue long term anticoagulation  Follow-up in AF clinic in 3 months  Thompson Grayer MD, [facility] 05/10/2016 12:02 PM

## 2016-05-10 NOTE — Patient Instructions (Signed)
Medication Instructions:  Your physician has recommended you make the following change in your medication:  1) Stop Amiodarone   Labwork: None ordered   Testing/Procedures: None ordered   Follow-Up: Your physician recommends that you schedule a follow-up appointment in: 3 months with Roderic Palau, NP in the afib clinic   Any Other Special Instructions Will Be Listed Below (If Applicable).     If you need a refill on your cardiac medications before your next appointment, please call your pharmacy.

## 2016-05-17 ENCOUNTER — Encounter (HOSPITAL_COMMUNITY): Payer: Medicare (Managed Care) | Admitting: Internal Medicine

## 2016-06-19 ENCOUNTER — Encounter (HOSPITAL_COMMUNITY): Payer: Self-pay | Admitting: Internal Medicine

## 2016-06-19 ENCOUNTER — Ambulatory Visit (HOSPITAL_COMMUNITY)
Admission: RE | Admit: 2016-06-19 | Discharge: 2016-06-19 | Disposition: A | Discharge status: Home / Self Care | Payer: Medicare (Managed Care) | Source: Ambulatory Visit | Attending: Internal Medicine | Admitting: Internal Medicine

## 2016-06-19 VITALS — BP 152/82 | HR 71 | Wt 190.8 lb

## 2016-06-19 DIAGNOSIS — Z7901 Long term (current) use of anticoagulants: Secondary | ICD-10-CM | POA: Insufficient documentation

## 2016-06-19 DIAGNOSIS — I480 Paroxysmal atrial fibrillation: Secondary | ICD-10-CM

## 2016-06-19 DIAGNOSIS — G20 Parkinson's disease: Secondary | ICD-10-CM

## 2016-06-19 DIAGNOSIS — I10 Essential (primary) hypertension: Secondary | ICD-10-CM

## 2016-06-19 DIAGNOSIS — I130 Hypertensive heart and chronic kidney disease with heart failure and stage 1 through stage 4 chronic kidney disease, or unspecified chronic kidney disease: Secondary | ICD-10-CM | POA: Diagnosis not present

## 2016-06-19 DIAGNOSIS — N184 Chronic kidney disease, stage 4 (severe): Secondary | ICD-10-CM | POA: Insufficient documentation

## 2016-06-19 DIAGNOSIS — Z794 Long term (current) use of insulin: Secondary | ICD-10-CM | POA: Diagnosis not present

## 2016-06-19 DIAGNOSIS — R531 Weakness: Secondary | ICD-10-CM | POA: Insufficient documentation

## 2016-06-19 DIAGNOSIS — E1122 Type 2 diabetes mellitus with diabetic chronic kidney disease: Secondary | ICD-10-CM | POA: Insufficient documentation

## 2016-06-19 DIAGNOSIS — J449 Chronic obstructive pulmonary disease, unspecified: Secondary | ICD-10-CM | POA: Insufficient documentation

## 2016-06-19 DIAGNOSIS — J9610 Chronic respiratory failure, unspecified whether with hypoxia or hypercapnia: Secondary | ICD-10-CM

## 2016-06-19 DIAGNOSIS — E785 Hyperlipidemia, unspecified: Secondary | ICD-10-CM | POA: Diagnosis not present

## 2016-06-19 DIAGNOSIS — I481 Persistent atrial fibrillation: Secondary | ICD-10-CM | POA: Insufficient documentation

## 2016-06-19 DIAGNOSIS — Z79899 Other long term (current) drug therapy: Secondary | ICD-10-CM | POA: Insufficient documentation

## 2016-06-19 DIAGNOSIS — Z885 Allergy status to narcotic agent status: Secondary | ICD-10-CM | POA: Diagnosis not present

## 2016-06-19 DIAGNOSIS — I5032 Chronic diastolic (congestive) heart failure: Secondary | ICD-10-CM | POA: Insufficient documentation

## 2016-06-19 DIAGNOSIS — E1140 Type 2 diabetes mellitus with diabetic neuropathy, unspecified: Secondary | ICD-10-CM

## 2016-06-19 DIAGNOSIS — G4730 Sleep apnea, unspecified: Secondary | ICD-10-CM | POA: Diagnosis not present

## 2016-06-19 DIAGNOSIS — F419 Anxiety disorder, unspecified: Secondary | ICD-10-CM | POA: Insufficient documentation

## 2016-06-19 DIAGNOSIS — Z87891 Personal history of nicotine dependence: Secondary | ICD-10-CM | POA: Insufficient documentation

## 2016-06-19 MED ORDER — AMLODIPINE BESYLATE 5 MG PO TABS: 5.0000 mg | ORAL_TABLET | Freq: Every day | ORAL | 6 refills | Status: DC

## 2016-06-19 NOTE — Addendum Note (Signed)
Encounter addended by: Effie Berkshire, RN on: 06/19/2016  2:53 PM<BR>    Actions taken: Order Reconciliation Section accessed, Home Medications modified, Order list changed, Sign clinical note

## 2016-06-19 NOTE — Progress Notes (Signed)
Advanced Heart Failure Clinic Note   Referring Physician: Dr. Ree Kida Primary Physician: Dr. Salvadore Oxford Primary Cardiologist:  Dr. Clayburn Pert HF: Dr. Haroldine Laws   HPI:  Melissa Powers is a 71 y.o.  female with PMH of HFpEF, Afib on anticoagulation, CKD Stage 3, T2DM, HTN, HLD, and Parkinson's disease.  She had previously followed with Cardiology in [place] and was a part of a [place].  Admitted 8/1 -10/22/15 with worsening DOE and generalized weakness despite adjustment of outpatient diuretics.  CXR with CHF with mild interstitial edema. She was treated with IV diuretics with resolution of symptoms. Discharge weight 182 lbs. VEST reading on d/c 43% suggesting residual volume overload.   She presents today for regular follow up. Feeling much better than last visit, but remains somewhat week. Riding a bike now 3 times a week and improving slowly.  Has good and bad days when it comes to breathing. Does get SOB with exercise, but can usually get around the house OK. Only occasional SOB with changing clothes or bathing. Denies lightheadedness or dizziness. Appetite comes and goes. Weight at home ~ 188-190. Has occasional palpitations but no prolonged episodes. She states at one point her HR went up to 190 and [facility] made adjustments to her medication. Started on new medication that she can't remember. Her husband called the pharmacy and it was amlodipine 2.5 daily.   Echo 10/20/15 with LVEF 55-60%, mild/mod MR, Mod LAE, mild RVH, mild RAE, PA peak pressure 69 mm Hg  Past Medical History:  Diagnosis Date  . Anxiety   . CHF (congestive heart failure) ([place])   . Chronic kidney disease (CKD), stage III (moderate)   . COPD (chronic obstructive pulmonary disease) ([place])   . Diastolic dysfunction with chronic heart failure ([place])   . Hyperlipidemia   . Hypertension   . Parkinson's disease ([place])    with deep brain stimulator  . Persistent atrial fibrillation ([place])   . Pneumonia 08/2015    . Sleep apnea    "tried mask; can't wear it" (10/19/2015)  . Type II diabetes mellitus ([place])     Current Outpatient Prescriptions  Medication Sig Dispense Refill  . atorvastatin (LIPITOR) 20 MG tablet Take 10 mg by mouth daily.    . carbidopa-levodopa (SINEMET IR) 25-100 MG per tablet Take 1 tablet by mouth 3 (three) times daily.    . cholecalciferol (VITAMIN D) 1000 units tablet Take 2,000 Units by mouth daily.    . citalopram (CELEXA) 20 MG tablet Take 20 mg by mouth daily.    Marland Kitchen gabapentin (NEURONTIN) 100 MG capsule Take 100 mg by mouth daily.    . hydrALAZINE (APRESOLINE) 50 MG tablet Take 1.5 tablets (75 mg total) by mouth 3 (three) times daily. 270 tablet 3  . insulin glargine (LANTUS) 100 UNIT/ML injection Inject 10 Units into the skin at bedtime.    . meclizine (ANTIVERT) 12.5 MG tablet Take 1 tablet (12.5 mg total) by mouth 2 (two) times daily as needed for dizziness. 30 tablet 0  . phenol (CHLORASEPTIC) 1.4 % LIQD Use as directed 1 spray in the mouth or throat as needed for throat irritation / pain.  0  . potassium chloride SA (K-DUR,KLOR-CON) 20 MEQ tablet Take 1 tablet (20 mEq total) by mouth once a week. ON FRIDAYS. AND AS NEEDED WITH METOLAZONE DOSE 10 tablet 3  . Rivaroxaban (XARELTO) 15 MG TABS tablet Take 15 mg by mouth daily with supper.    Marland Kitchen rOPINIRole (REQUIP) 0.25 MG tablet  Take 2 tablets (0.5 mg total) by mouth 2 (two) times daily. 30 tablet 0  . torsemide (DEMADEX) 20 MG tablet Take 1 tablet (20 mg total) by mouth once a week. ON FRIDAYS, MAY TAKE AND ADDITIONAL TAB AS NEEDED FOR WEIGHT GREATER THAN 204 LBS 10 tablet 3  . traZODone (DESYREL) 50 MG tablet Take 50 mg by mouth at bedtime.     . valsartan (DIOVAN) 320 MG tablet Take 1 tablet (320 mg total) by mouth daily.     No current facility-administered medications for this encounter.    Facility-Administered Medications Ordered in Other Encounters  Medication Dose Route Frequency Provider Last Rate Last Dose  . 0.9  %  sodium chloride infusion    Continuous PRN Neldon Newport, CRNA 0 mL/hr at 12/03/15 1156      Allergies  Allergen Reactions  . Morphine     Other reaction(s): Mental Status Changes (intolerance)  . Morphine And Related Other (See Comments)    Pt reports intolerance due to sedation from morphine      Social History   Social History  . Marital status: Married    Spouse name: N/A  . Number of children: N/A  . Years of education: N/A   Occupational History  . Not on file.   Social History Main Topics  . Smoking status: Former Smoker    Quit date: 12/31/1983  . Smokeless tobacco: Never Used  . Alcohol use No  . Drug use: No  . Sexual activity: Not Currently   Other Topics Concern  . Not on file   Social History Narrative   Pt lives in [place] with spouse.  Retired Quarry manager.    Vitals:   06/19/16 1403  BP: (!) 152/82  Pulse: 71  Weight: 190 lb 12.8 oz (86.5 kg)   Wt Readings from Last 3 Encounters:  06/19/16 190 lb 12.8 oz (86.5 kg)  05/10/16 191 lb 3.2 oz (86.7 kg)  04/20/16 200 lb 4 oz (90.8 kg)    PHYSICAL EXAM: General: Elderly and fatigued appearing.  NAD. No resp difficulty Psych: Normal affect.  HEENT: Normal, without mass or lesion. Neck: Supple, no bruits or JVD. Carotids 2+. No lymphadenopathy/thyromegaly appreciated. Heart: PMI nondisplaced. RRR no s3, s4, or murmurs. Lungs: Resp regular and unlabored, CTA. Abdomen: Soft, non-tender, non-distended, No HSM, BS + x 4.  Extremities: No clubbing, cyanosis or edema. DP/PT/Radials 2+ and equal bilaterally. Neuro: Alert and oriented X 3. Moves all extremities spontaneously.   Assessment/Plan    1. Chronic diastolic CHF - Echo 03/9507 LVEF 55-60% - Volume status looks stable on exam.  - NYHA III symptoms at baseline. Seems somewhat improved with exercise.  - Continue torsemide 20 mg every Friday with 20 meq of KCl. Can take extra of both with weight gain of 3 lbs overnight or 5 lbs within a  week.   - Reinforced fluid restriction to < 2 L daily, sodium restriction to less than 2000 mg daily, and the importance of daily weights.    2. Chronic respiratory failure - Stable on chronic 02 walking into clinic.   3. Paroxysmal Afib- S/P ablation  - NSR by exam today but recent episode with "rates in 190s" per pt. ? SVT.  - Recently started on amldopine 2.5  4. CKD stage III - IV - Stable on recent check.  5. T2DM with neuropathy - Per PCP.  6. HTN - Mildly elevated in clinic.  - Continue hydralazine 75 mg TID.  - Increase  amlodipine to 5mg  daily  7. HLD - Per PCP.  8. Anxiety- Per PCP 9. Parkinsons with falls and weakness. - Continue Rehab via the PACE program.   Shirley Friar, PA-C  2:30 PM  Patient seen and examined with the above-signed Advanced Practice Provider and/or Housestaff. I personally reviewed laboratory data, imaging studies and relevant notes. I independently examined the patient and formulated the important aspects of the plan. I have edited the note to reflect any of my changes or salient points. I have personally discussed the plan with the patient and/or family.  Overall doing very well. Volume status looks good. AF under good control. Did have one prolonged episode of tachycardia about 3 weeks ago without recurrence. If recurs wee will place event monitor. We also discussed option of [employer] device if she is interested. Continue Xarelto. Increase amlodipine to 5mg  for HTN. Continue PACE program.   Glori Bickers, MD  2:47 PM

## 2016-06-19 NOTE — Patient Instructions (Signed)
No lab work today.  INCREASE Amlodipine (Norvasc) to 5 mg once daily.  Follow up with Dr. Haroldine Laws in 3-4 months.  Do the following things EVERYDAY: 1) Weigh yourself in the morning before breakfast. Write it down and keep it in a log. 2) Take your medicines as prescribed 3) Eat low salt foods-Limit salt (sodium) to 2000 mg per day.  4) Stay as active as you can everyday 5) Limit all fluids for the day to less than 2 liters

## 2016-06-23 ENCOUNTER — Telehealth (HOSPITAL_COMMUNITY): Payer: Self-pay | Admitting: Internal Medicine

## 2016-06-23 ENCOUNTER — Telehealth (HOSPITAL_COMMUNITY): Payer: Self-pay | Admitting: Cardiology

## 2016-06-23 DIAGNOSIS — I482 Chronic atrial fibrillation, unspecified: Secondary | ICD-10-CM

## 2016-06-23 NOTE — Telephone Encounter (Signed)
[facility] is @[facility].  I called & spoke with Tiffany there.  She is aware of pt's appt on 06/30/16 @12 :30 for Cardiac Even Monitor @[facility].  Tiffany was given address & phone number for that office

## 2016-06-23 NOTE — Telephone Encounter (Signed)
Patient left message with the answering service that her heart rate increased into the 140's last night.  Order placed and patient aware Patient reports she is feeling better today however HR was elevated  for the past two nights.  Pt currently staying a [facility] [phone]

## 2016-06-30 ENCOUNTER — Ambulatory Visit: Payer: Medicare (Managed Care)

## 2016-06-30 DIAGNOSIS — I482 Chronic atrial fibrillation, unspecified: Secondary | ICD-10-CM

## 2016-07-04 ENCOUNTER — Telehealth: Payer: Self-pay | Admitting: Radiology

## 2016-07-04 NOTE — Telephone Encounter (Signed)
30 Event Monitor applied to patient on Fri 06-30-16. Spoke to the monitor company today and due to the patients parkinsons stimulator (located in patient chest) we are only receiving artifact on the monitor recordings. She had a [employer] patch applied and per the company we could try the lead version. These electrodes are located at different place on the chest. The monitor company is not very optimistic that the new version will work either. I agree looking at previous EKG's in the system and the recordings we are receiving now im not sure the new monitor will work either. Patient was contacted and told to send the "patch" version back and is waiting to hear if the doctor wants to try another version. What would you like to do?

## 2016-07-05 NOTE — Telephone Encounter (Signed)
It doesn't sound like it will work. Ok to abandon.

## 2016-07-06 ENCOUNTER — Telehealth: Payer: Self-pay | Admitting: Cardiology

## 2016-07-06 NOTE — Telephone Encounter (Signed)
[employer] and the patient was called and made aware

## 2016-07-06 NOTE — Telephone Encounter (Signed)
Called [employer] told them per the doctor the monitor can stopped. Due to patient having a stimulator in  Her chest

## 2016-07-06 NOTE — Telephone Encounter (Signed)
New Message     They would like to know how to proceed with changing out the monitor for this patient

## 2016-07-13 ENCOUNTER — Telehealth (HOSPITAL_COMMUNITY): Payer: Self-pay

## 2016-07-13 NOTE — Telephone Encounter (Signed)
Received call from [place] at patient's care facility stating patient requesting f/u apt with CHF clinic as she was unable to do the heart monitor and she continues to have rapid heart palpitations. Dr. Haroldine Laws saw patient 2-3 weeks ago and ordered the heart monitor, however he does not have any clinic openings for over a month.  Scheduled with next available opening with Oda Kilts PA-C next week.  advised if her s/s worsen to report to ED.  Renee Pain, RN

## 2016-07-19 ENCOUNTER — Ambulatory Visit (HOSPITAL_COMMUNITY)
Admission: RE | Admit: 2016-07-19 | Discharge: 2016-07-19 | Disposition: A | Discharge status: Home / Self Care | Payer: Medicare (Managed Care) | Source: Ambulatory Visit | Attending: Internal Medicine | Admitting: Internal Medicine

## 2016-07-19 VITALS — BP 218/94 | HR 86 | Wt 194.8 lb

## 2016-07-19 DIAGNOSIS — E785 Hyperlipidemia, unspecified: Secondary | ICD-10-CM | POA: Diagnosis not present

## 2016-07-19 DIAGNOSIS — Z794 Long term (current) use of insulin: Secondary | ICD-10-CM | POA: Diagnosis not present

## 2016-07-19 DIAGNOSIS — Z7901 Long term (current) use of anticoagulants: Secondary | ICD-10-CM | POA: Insufficient documentation

## 2016-07-19 DIAGNOSIS — G20 Parkinson's disease: Secondary | ICD-10-CM

## 2016-07-19 DIAGNOSIS — I480 Paroxysmal atrial fibrillation: Secondary | ICD-10-CM | POA: Insufficient documentation

## 2016-07-19 DIAGNOSIS — G4730 Sleep apnea, unspecified: Secondary | ICD-10-CM | POA: Diagnosis not present

## 2016-07-19 DIAGNOSIS — E1122 Type 2 diabetes mellitus with diabetic chronic kidney disease: Secondary | ICD-10-CM | POA: Insufficient documentation

## 2016-07-19 DIAGNOSIS — F419 Anxiety disorder, unspecified: Secondary | ICD-10-CM | POA: Diagnosis not present

## 2016-07-19 DIAGNOSIS — I130 Hypertensive heart and chronic kidney disease with heart failure and stage 1 through stage 4 chronic kidney disease, or unspecified chronic kidney disease: Secondary | ICD-10-CM | POA: Insufficient documentation

## 2016-07-19 DIAGNOSIS — J9610 Chronic respiratory failure, unspecified whether with hypoxia or hypercapnia: Secondary | ICD-10-CM

## 2016-07-19 DIAGNOSIS — R002 Palpitations: Secondary | ICD-10-CM | POA: Diagnosis not present

## 2016-07-19 DIAGNOSIS — N184 Chronic kidney disease, stage 4 (severe): Secondary | ICD-10-CM

## 2016-07-19 DIAGNOSIS — I5032 Chronic diastolic (congestive) heart failure: Secondary | ICD-10-CM | POA: Insufficient documentation

## 2016-07-19 DIAGNOSIS — J449 Chronic obstructive pulmonary disease, unspecified: Secondary | ICD-10-CM | POA: Insufficient documentation

## 2016-07-19 DIAGNOSIS — Z87891 Personal history of nicotine dependence: Secondary | ICD-10-CM | POA: Diagnosis not present

## 2016-07-19 DIAGNOSIS — I471 Supraventricular tachycardia: Secondary | ICD-10-CM | POA: Diagnosis not present

## 2016-07-19 DIAGNOSIS — I10 Essential (primary) hypertension: Secondary | ICD-10-CM

## 2016-07-19 MED ORDER — HYDRALAZINE HCL 100 MG PO TABS: 100.0000 mg | ORAL_TABLET | Freq: Three times a day (TID) | ORAL | 6 refills | Status: DC

## 2016-07-19 NOTE — Patient Instructions (Signed)
INCREASE Hydralazine to 100 mg three times daily. Rx has been sent for 100 mg tablets to your pharmacy electronically.  Will schedule you for a holter monitor (heart monitor) to be placed at [facility] office. Address: [address] ([place]), [place], [place] [address]  Phone: [phone]  Follow up with Dr. Haroldine Laws in 4-6 weeks.  Do the following things EVERYDAY: 1) Weigh yourself in the morning before breakfast. Write it down and keep it in a log. 2) Take your medicines as prescribed 3) Eat low salt foods-Limit salt (sodium) to 2000 mg per day.  4) Stay as active as you can everyday 5) Limit all fluids for the day to less than 2 liters

## 2016-07-19 NOTE — Progress Notes (Signed)
Advanced Heart Failure Clinic Note   Referring Physician: Dr. Ree Kida Primary Physician: Dr. Salvadore Oxford Primary Cardiologist:  Dr. Clayburn Pert HF: Dr. Haroldine Laws   HPI:  Melissa Powers is a 71 y.o.  female with PMH of HFpEF, Afib on anticoagulation, CKD Stage 3, T2DM, HTN, HLD, and Parkinson's disease.  She had previously followed with Cardiology in [place] and was a part of a [place].  Admitted 8/1 -10/22/15 with worsening DOE and generalized weakness despite adjustment of outpatient diuretics.  CXR with CHF with mild interstitial edema. She was treated with IV diuretics with resolution of symptoms. Discharge weight 182 lbs. VEST reading on d/c 43% suggesting residual volume overload.   She presents today for regular follow up.  Feeling OK, but still having afib 4/7 days a week.  She was unable to complete Heart Monitor due to her parkinson stimulator. Breathing has been "pretty good". SOB with house hold chores. Doesn't do very much anymore and using her walker.  Balance is very poor. When she has an episode of tachycardia it lasts anywhere from 10-15 minutes to upwards of 2 hours. She states she is taking all of her medications.   Echo 10/20/15 with LVEF 55-60%, mild/mod MR, Mod LAE, mild RVH, mild RAE, PA peak pressure 69 mm Hg  Past Medical History:  Diagnosis Date  . Anxiety   . CHF (congestive heart failure) ([place])   . Chronic kidney disease (CKD), stage III (moderate)   . COPD (chronic obstructive pulmonary disease) ([place])   . Diastolic dysfunction with chronic heart failure ([place])   . Hyperlipidemia   . Hypertension   . Parkinson's disease ([place])    with deep brain stimulator  . Persistent atrial fibrillation ([place])   . Pneumonia 08/2015  . Sleep apnea    "tried mask; can't wear it" (10/19/2015)  . Type II diabetes mellitus ([place])     Current Outpatient Prescriptions  Medication Sig Dispense Refill  . amLODipine (NORVASC) 5 MG tablet Take 1 tablet (5 mg total) by  mouth daily. 30 tablet 6  . atorvastatin (LIPITOR) 20 MG tablet Take 10 mg by mouth daily.    . carbidopa-levodopa (SINEMET IR) 25-100 MG per tablet Take 1 tablet by mouth 3 (three) times daily.    . cholecalciferol (VITAMIN D) 1000 units tablet Take 2,000 Units by mouth daily.    . citalopram (CELEXA) 20 MG tablet Take 20 mg by mouth daily.    Marland Kitchen gabapentin (NEURONTIN) 100 MG capsule Take 100 mg by mouth daily.    . hydrALAZINE (APRESOLINE) 50 MG tablet Take 1.5 tablets (75 mg total) by mouth 3 (three) times daily. 270 tablet 3  . insulin glargine (LANTUS) 100 UNIT/ML injection Inject 10 Units into the skin at bedtime.    . meclizine (ANTIVERT) 12.5 MG tablet Take 1 tablet (12.5 mg total) by mouth 2 (two) times daily as needed for dizziness. 30 tablet 0  . phenol (CHLORASEPTIC) 1.4 % LIQD Use as directed 1 spray in the mouth or throat as needed for throat irritation / pain.  0  . potassium chloride SA (K-DUR,KLOR-CON) 20 MEQ tablet Take 1 tablet (20 mEq total) by mouth once a week. ON FRIDAYS. AND AS NEEDED WITH METOLAZONE DOSE 10 tablet 3  . Rivaroxaban (XARELTO) 15 MG TABS tablet Take 15 mg by mouth daily with supper.    Marland Kitchen rOPINIRole (REQUIP) 3 MG tablet Take 3 mg by mouth at bedtime.  0  . torsemide (DEMADEX) 20 MG tablet Take  1 tablet (20 mg total) by mouth once a week. ON FRIDAYS, MAY TAKE AND ADDITIONAL TAB AS NEEDED FOR WEIGHT GREATER THAN 204 LBS 10 tablet 3  . traMADol (ULTRAM) 50 MG tablet Take 50 mg by mouth every 6 (six) hours as needed for moderate pain.    . traZODone (DESYREL) 50 MG tablet Take 50 mg by mouth at bedtime.     . valsartan (DIOVAN) 320 MG tablet Take 1 tablet (320 mg total) by mouth daily.     No current facility-administered medications for this encounter.    Facility-Administered Medications Ordered in Other Encounters  Medication Dose Route Frequency Provider Last Rate Last Dose  . 0.9 %  sodium chloride infusion    Continuous PRN Neldon Newport, CRNA 0 mL/hr  at 12/03/15 1156      Allergies  Allergen Reactions  . Morphine     Other reaction(s): Mental Status Changes (intolerance)  . Morphine And Related Other (See Comments)    Pt reports intolerance due to sedation from morphine      Social History   Social History  . Marital status: Married    Spouse name: N/A  . Number of children: N/A  . Years of education: N/A   Occupational History  . Not on file.   Social History Main Topics  . Smoking status: Former Smoker    Quit date: 12/31/1983  . Smokeless tobacco: Never Used  . Alcohol use No  . Drug use: No  . Sexual activity: Not Currently   Other Topics Concern  . Not on file   Social History Narrative   Pt lives in [place] with spouse.  Retired Quarry manager.    Vitals:   07/19/16 1433  BP: (!) 218/94  Pulse: 86  SpO2: 94%  Weight: 194 lb 12.8 oz (88.4 kg)   Repeat BP 138/84  Wt Readings from Last 3 Encounters:  07/19/16 194 lb 12.8 oz (88.4 kg)  06/19/16 190 lb 12.8 oz (86.5 kg)  05/10/16 191 lb 3.2 oz (86.7 kg)    PHYSICAL EXAM: General: Elderly and fatigued appearing. NAD. + resting tremor HEENT: Normal Neck: Supple. JVP 7-8 cm. Carotids 2+ bilat; no bruits. No thyromegaly or nodule noted. Cor: PMI nondisplaced. RRR. No M/G/R noted.  Lungs: Clear normal effort.  Abdomen: soft, non-tender, distended, no HSM. No bruits or masses. +BS  Extremities: no cyanosis, clubbing, rash, R and LLE no edema.  Neuro: alert & orientedx3, cranial nerves grossly intact. moves all 4 extremities w/o difficulty. Affect pleasant   Assessment/Plan    1. Chronic diastolic CHF - Echo 10/1273 LVEF 55-60% - Volume status stable on exam.  - NYHA III symptoms.  - continue torsemide 20 mg q Friday with 20 meq of K, take extra as needed.  - Reinforced fluid restriction to < 2 L daily, sodium restriction to less than 2000 mg daily, and the importance of daily weights.   2. Chronic respiratory failure - Stable. Continue chronic 02.  3.  Paroxysmal Afib- S/P ablation  - NSR by exam and EKGs inaccurate due to deep brain stimulator.  Was unable to complete 30 day event monitor due to same.  - Continues to have palpitations and SVT 4/7 days a week. Discussed with HF and EP MDs. Pt is able to turn off her deep brain stimulator. Will place 48 hour holter and have patient turn off her stimulator for 2 days. If we do not catch, will have to discuss other options.  Perhaps place 30  day event monitor and have patient turn off her stimulator with any palpitations.   4. CKD stage III - IV - Has been stable.  5. T2DM with neuropathy - Per PCP. 6. HTN Urgency - Markedly elevated to clinic, but not immediately rechecked so ? Accuracy. - Asymptomatic.  - Increase hydralazine 100 mg TID.   - Continue amlodipine 5 mg.  7. HLD - Per PCP 8. Anxiety- Per PCP 9. Parkinsons with falls and weakness. - Continue [facility]. No change.  - Will have her deactivate her deep brain stimulator while on 48 hour holter monitor. She states that her tremors worsen when it is off but otherwise this is OK.   Will place 48 holter as above. Follow up with Dr. Haroldine Laws in 4-6 weeks.   Shirley Friar, PA-C  2:47 PM   Greater than 50% of the 25 minute visit was spent in counseling/coordination of care regarding disease state education, discussion of plan with EP and HF MD, and salt/fluid restriction.

## 2016-07-31 ENCOUNTER — Ambulatory Visit (INDEPENDENT_AMBULATORY_CARE_PROVIDER_SITE_OTHER): Payer: Medicare (Managed Care)

## 2016-07-31 DIAGNOSIS — R002 Palpitations: Secondary | ICD-10-CM | POA: Diagnosis not present

## 2016-08-09 ENCOUNTER — Ambulatory Visit (HOSPITAL_COMMUNITY)
Admission: RE | Admit: 2016-08-09 | Discharge: 2016-08-09 | Disposition: A | Discharge status: Home / Self Care | Payer: Medicare (Managed Care) | Source: Ambulatory Visit | Attending: Nurse Practitioner | Admitting: Nurse Practitioner

## 2016-08-09 ENCOUNTER — Encounter (HOSPITAL_COMMUNITY): Payer: Self-pay | Admitting: Nurse Practitioner

## 2016-08-09 VITALS — BP 114/62 | HR 70 | Ht 63.5 in | Wt 204.8 lb

## 2016-08-09 DIAGNOSIS — Z9889 Other specified postprocedural states: Secondary | ICD-10-CM | POA: Insufficient documentation

## 2016-08-09 DIAGNOSIS — Z90710 Acquired absence of both cervix and uterus: Secondary | ICD-10-CM | POA: Insufficient documentation

## 2016-08-09 DIAGNOSIS — E1122 Type 2 diabetes mellitus with diabetic chronic kidney disease: Secondary | ICD-10-CM | POA: Insufficient documentation

## 2016-08-09 DIAGNOSIS — I4892 Unspecified atrial flutter: Secondary | ICD-10-CM | POA: Diagnosis not present

## 2016-08-09 DIAGNOSIS — N183 Chronic kidney disease, stage 3 (moderate): Secondary | ICD-10-CM | POA: Diagnosis not present

## 2016-08-09 DIAGNOSIS — Z8489 Family history of other specified conditions: Secondary | ICD-10-CM | POA: Diagnosis not present

## 2016-08-09 DIAGNOSIS — Z885 Allergy status to narcotic agent status: Secondary | ICD-10-CM | POA: Insufficient documentation

## 2016-08-09 DIAGNOSIS — I509 Heart failure, unspecified: Secondary | ICD-10-CM | POA: Insufficient documentation

## 2016-08-09 DIAGNOSIS — G20 Parkinson's disease: Secondary | ICD-10-CM | POA: Insufficient documentation

## 2016-08-09 DIAGNOSIS — J449 Chronic obstructive pulmonary disease, unspecified: Secondary | ICD-10-CM | POA: Insufficient documentation

## 2016-08-09 DIAGNOSIS — Z9851 Tubal ligation status: Secondary | ICD-10-CM | POA: Insufficient documentation

## 2016-08-09 DIAGNOSIS — Z87891 Personal history of nicotine dependence: Secondary | ICD-10-CM | POA: Diagnosis not present

## 2016-08-09 DIAGNOSIS — Z794 Long term (current) use of insulin: Secondary | ICD-10-CM | POA: Diagnosis not present

## 2016-08-09 DIAGNOSIS — Z823 Family history of stroke: Secondary | ICD-10-CM | POA: Diagnosis not present

## 2016-08-09 DIAGNOSIS — Z79891 Long term (current) use of opiate analgesic: Secondary | ICD-10-CM | POA: Insufficient documentation

## 2016-08-09 DIAGNOSIS — Z7901 Long term (current) use of anticoagulants: Secondary | ICD-10-CM | POA: Diagnosis not present

## 2016-08-09 DIAGNOSIS — F419 Anxiety disorder, unspecified: Secondary | ICD-10-CM | POA: Diagnosis not present

## 2016-08-09 DIAGNOSIS — I481 Persistent atrial fibrillation: Secondary | ICD-10-CM | POA: Insufficient documentation

## 2016-08-09 DIAGNOSIS — I130 Hypertensive heart and chronic kidney disease with heart failure and stage 1 through stage 4 chronic kidney disease, or unspecified chronic kidney disease: Secondary | ICD-10-CM | POA: Insufficient documentation

## 2016-08-09 DIAGNOSIS — E785 Hyperlipidemia, unspecified: Secondary | ICD-10-CM | POA: Insufficient documentation

## 2016-08-09 NOTE — Progress Notes (Addendum)
Primary Care Physician: Marco Collie, MD Referring Physician Dr. Rayann Heman EP: Dr. Rayann Heman AHF: Dr. Hulan Saas Melissa Powers is a 71 y.o. female with a h/o diastolic dysfunction, Parkinson's disease (with deep brain stimulator), HTN, afib with ablation 02/03/16. She was seen in February with Dr. Rayann Heman and was felt to be staying in SR and amiodarone was stopped.She was then sen by Dr. Haroldine Laws in April and was c/o high heart rate episodes. Event monitor was placed and did confirm high v rates up to 135 and review of strips does show a regular rapid rhythm, possible 2:1 atrial flutter. She will usually feel the episodes 2x a week and they are not sustained. Overall, her afib burden is much reduced since ablation. She did fall in her kitchen this am. States she will fall occasionally due to being off balance from parkinson's. She did not hurt herself this am. On xarelto 15 mgs daily.  Today, she denies symptoms of palpitations, chest pain, shortness of breath, orthopnea, PND, lower extremity edema, dizziness, presyncope, syncope, or neurologic sequela. The patient is tolerating medications without difficulties and is otherwise without complaint today.   Past Medical History:  Diagnosis Date  . Anxiety   . CHF (congestive heart failure) ([place])   . Chronic kidney disease (CKD), stage III (moderate)   . COPD (chronic obstructive pulmonary disease) ([place])   . Diastolic dysfunction with chronic heart failure ([place])   . Hyperlipidemia   . Hypertension   . Parkinson's disease ([place])    with deep brain stimulator  . Persistent atrial fibrillation ([place])   . Pneumonia 08/2015  . Sleep apnea    "tried mask; can't wear it" (10/19/2015)  . Type II diabetes mellitus ([place])    Past Surgical History:  Procedure Laterality Date  . ABDOMINAL HYSTERECTOMY    . CARDIOVERSION N/A 12/22/2015   Procedure: CARDIOVERSION;  Surgeon: Jolaine Artist, MD;  Location: [facility] ENDOSCOPY;  Service: Cardiovascular;  Laterality:  N/A;  . DEEP BRAIN STIMULATOR PLACEMENT  01/2015   for Parikinsons treatment  . ELECTROPHYSIOLOGIC STUDY N/A 02/03/2016   Procedure: Atrial Fibrillation Ablation;  Surgeon: Thompson Grayer, MD;  Location: [place] CV LAB;  Service: Cardiovascular;  Laterality: N/A;  . TEE WITHOUT CARDIOVERSION N/A 02/03/2016   Procedure: TRANSESOPHAGEAL ECHOCARDIOGRAM (TEE);  Surgeon: Larey Dresser, MD;  Location: [place] [facility];  Service: Cardiovascular;  Laterality: N/A;  . TUBAL LIGATION      Current Outpatient Prescriptions  Medication Sig Dispense Refill  . amLODipine (NORVASC) 5 MG tablet Take 1 tablet (5 mg total) by mouth daily. 30 tablet 6  . atorvastatin (LIPITOR) 20 MG tablet Take 10 mg by mouth daily.    . carbidopa-levodopa (SINEMET IR) 25-100 MG per tablet Take 1 tablet by mouth 3 (three) times daily.    . cholecalciferol (VITAMIN D) 1000 units tablet Take 2,000 Units by mouth daily.    . citalopram (CELEXA) 20 MG tablet Take 20 mg by mouth daily.    Marland Kitchen gabapentin (NEURONTIN) 100 MG capsule Take 100 mg by mouth daily.    . hydrALAZINE (APRESOLINE) 100 MG tablet Take 1 tablet (100 mg total) by mouth 3 (three) times daily. 90 tablet 6  . insulin glargine (LANTUS) 100 UNIT/ML injection Inject 10 Units into the skin at bedtime.    . meclizine (ANTIVERT) 12.5 MG tablet Take 1 tablet (12.5 mg total) by mouth 2 (two) times daily as needed for dizziness. 30 tablet 0  . phenol (CHLORASEPTIC) 1.4 % LIQD Use  as directed 1 spray in the mouth or throat as needed for throat irritation / pain.  0  . potassium chloride SA (K-DUR,KLOR-CON) 20 MEQ tablet Take 1 tablet (20 mEq total) by mouth once a week. ON FRIDAYS. AND AS NEEDED WITH METOLAZONE DOSE 10 tablet 3  . Rivaroxaban (XARELTO) 15 MG TABS tablet Take 15 mg by mouth daily with supper.    Marland Kitchen rOPINIRole (REQUIP) 3 MG tablet Take 3 mg by mouth at bedtime.  0  . torsemide (DEMADEX) 20 MG tablet Take 1 tablet (20 mg total) by mouth once a week. ON FRIDAYS, MAY  TAKE AND ADDITIONAL TAB AS NEEDED FOR WEIGHT GREATER THAN 204 LBS 10 tablet 3  . traMADol (ULTRAM) 50 MG tablet Take 50 mg by mouth every 6 (six) hours as needed for moderate pain.    . traZODone (DESYREL) 50 MG tablet Take 50 mg by mouth at bedtime.     . valsartan (DIOVAN) 320 MG tablet Take 1 tablet (320 mg total) by mouth daily.     No current facility-administered medications for this encounter.    Facility-Administered Medications Ordered in Other Encounters  Medication Dose Route Frequency Provider Last Rate Last Dose  . 0.9 %  sodium chloride infusion    Continuous PRN Neldon Newport, CRNA 0 mL/hr at 12/03/15 1156      Allergies  Allergen Reactions  . Morphine     Other reaction(s): Mental Status Changes (intolerance)  . Morphine And Related Other (See Comments)    Pt reports intolerance due to sedation from morphine    Social History   Social History  . Marital status: Married    Spouse name: N/A  . Number of children: N/A  . Years of education: N/A   Occupational History  . Not on file.   Social History Main Topics  . Smoking status: Former Smoker    Quit date: 12/31/1983  . Smokeless tobacco: Never Used  . Alcohol use No  . Drug use: No  . Sexual activity: Not Currently   Other Topics Concern  . Not on file   Social History Narrative   Pt lives in [place] with spouse.  Retired Quarry manager.    Family History  Problem Relation Age of Onset  . Dementia Mother   . Heart Problems Father   . Stroke Sister   . Kidney failure Brother        on ESRD    ROS- All systems are reviewed and negative except as per the HPI above  Physical Exam: Vitals:   08/09/16 1111  BP: 114/62  Pulse: 70  Weight: 204 lb 12.8 oz (92.9 kg)  Height: 5' 3.5" (1.613 m)   Wt Readings from Last 3 Encounters:  08/09/16 204 lb 12.8 oz (92.9 kg)  07/19/16 194 lb 12.8 oz (88.4 kg)  06/19/16 190 lb 12.8 oz (86.5 kg)    Labs: Lab Results  Component Value Date   NA 136  04/20/2016   K 4.3 04/20/2016   CL 104 04/20/2016   CO2 25 04/20/2016   GLUCOSE 172 (H) 04/20/2016   BUN 17 04/20/2016   CREATININE 1.39 (H) 04/20/2016   CALCIUM 9.2 04/20/2016   MG 2.7 (H) 03/17/2016   No results found for: INR No results found for: CHOL, HDL, LDLCALC, TRIG   GEN- The patient is well appearing, alert and oriented x 3 today.   Head- normocephalic, atraumatic Eyes-  Sclera clear, conjunctiva pink Ears- hearing intact Oropharynx- clear Neck- supple, no  JVP Lymph- no cervical lymphadenopathy Lungs- Clear to ausculation bilaterally, normal work of breathing Heart- Regular rate and rhythm, no murmurs, rubs or gallops, PMI not laterally displaced GI- soft, NT, ND, + BS Extremities- no clubbing, cyanosis, or edema MS- no significant deformity or atrophy Skin- no rash or lesion Psych- euthymic mood, full affect Neuro- strength and sensation are intact  EKG-SR at 71 bpm, qrs int 180 ms, qtc 588 ms Epic records reviewed Event monitor strips reviewed    Assessment and Plan: 1. Afib S/p ablation Amiodarone was stopped in February Pt feels that tachycardia episodes have increased since then  Event monitor confirms probable paroxysmal atrial flutter 2:1  Will discuss with Dr. Rayann Heman restarting amiodarone Continue xarelto 15 mg qd   Geroge Baseman. Kasmira Cacioppo, [facility] [address] [place], [place] [address] [place] with Dr. Rayann Heman and he said that pt could start back on amiodarone or have another ablation. I discussed with pt and she did well on amiodarone before and would like to get back on amiodarone. If this does not control the situation, she will consider another ablation. Will rx amiodarone 200 mg bid x 2 weeks then decrease to 200 mg one a day. She has a f/u in AHF clinic and have asked for EKG to be done to review back on amiodarone.

## 2016-08-10 ENCOUNTER — Other Ambulatory Visit (HOSPITAL_COMMUNITY): Payer: Self-pay | Admitting: *Deleted

## 2016-08-10 MED ORDER — AMIODARONE HCL 200 MG PO TABS: ORAL_TABLET | ORAL | 3 refills | Status: DC

## 2016-08-10 NOTE — Addendum Note (Signed)
Encounter addended by: Sherran Needs, NP on: 08/10/2016  1:22 PM<BR>    Actions taken: Sign clinical note

## 2016-08-24 ENCOUNTER — Ambulatory Visit (HOSPITAL_COMMUNITY)
Admission: RE | Admit: 2016-08-24 | Discharge: 2016-08-24 | Disposition: A | Discharge status: Home / Self Care | Payer: Medicare (Managed Care) | Source: Ambulatory Visit | Attending: Internal Medicine | Admitting: Internal Medicine

## 2016-08-24 ENCOUNTER — Encounter (HOSPITAL_COMMUNITY): Payer: Self-pay | Admitting: Internal Medicine

## 2016-08-24 VITALS — BP 157/86 | HR 81 | Wt 201.2 lb

## 2016-08-24 DIAGNOSIS — N184 Chronic kidney disease, stage 4 (severe): Secondary | ICD-10-CM | POA: Insufficient documentation

## 2016-08-24 DIAGNOSIS — E1140 Type 2 diabetes mellitus with diabetic neuropathy, unspecified: Secondary | ICD-10-CM | POA: Diagnosis not present

## 2016-08-24 DIAGNOSIS — G4730 Sleep apnea, unspecified: Secondary | ICD-10-CM | POA: Diagnosis not present

## 2016-08-24 DIAGNOSIS — Z87891 Personal history of nicotine dependence: Secondary | ICD-10-CM | POA: Insufficient documentation

## 2016-08-24 DIAGNOSIS — J209 Acute bronchitis, unspecified: Secondary | ICD-10-CM | POA: Insufficient documentation

## 2016-08-24 DIAGNOSIS — J440 Chronic obstructive pulmonary disease with acute lower respiratory infection: Secondary | ICD-10-CM | POA: Diagnosis not present

## 2016-08-24 DIAGNOSIS — G20 Parkinson's disease: Secondary | ICD-10-CM | POA: Diagnosis not present

## 2016-08-24 DIAGNOSIS — I4892 Unspecified atrial flutter: Secondary | ICD-10-CM

## 2016-08-24 DIAGNOSIS — E1122 Type 2 diabetes mellitus with diabetic chronic kidney disease: Secondary | ICD-10-CM | POA: Insufficient documentation

## 2016-08-24 DIAGNOSIS — I130 Hypertensive heart and chronic kidney disease with heart failure and stage 1 through stage 4 chronic kidney disease, or unspecified chronic kidney disease: Secondary | ICD-10-CM | POA: Diagnosis present

## 2016-08-24 DIAGNOSIS — Z885 Allergy status to narcotic agent status: Secondary | ICD-10-CM | POA: Insufficient documentation

## 2016-08-24 DIAGNOSIS — Z794 Long term (current) use of insulin: Secondary | ICD-10-CM | POA: Insufficient documentation

## 2016-08-24 DIAGNOSIS — F419 Anxiety disorder, unspecified: Secondary | ICD-10-CM | POA: Diagnosis not present

## 2016-08-24 DIAGNOSIS — Z79899 Other long term (current) drug therapy: Secondary | ICD-10-CM | POA: Insufficient documentation

## 2016-08-24 DIAGNOSIS — I10 Essential (primary) hypertension: Secondary | ICD-10-CM | POA: Diagnosis not present

## 2016-08-24 DIAGNOSIS — J9610 Chronic respiratory failure, unspecified whether with hypoxia or hypercapnia: Secondary | ICD-10-CM | POA: Insufficient documentation

## 2016-08-24 DIAGNOSIS — I5032 Chronic diastolic (congestive) heart failure: Secondary | ICD-10-CM

## 2016-08-24 DIAGNOSIS — I480 Paroxysmal atrial fibrillation: Secondary | ICD-10-CM

## 2016-08-24 DIAGNOSIS — E785 Hyperlipidemia, unspecified: Secondary | ICD-10-CM | POA: Diagnosis not present

## 2016-08-24 DIAGNOSIS — Z9981 Dependence on supplemental oxygen: Secondary | ICD-10-CM | POA: Diagnosis not present

## 2016-08-24 DIAGNOSIS — R531 Weakness: Secondary | ICD-10-CM | POA: Insufficient documentation

## 2016-08-24 DIAGNOSIS — Z9181 History of falling: Secondary | ICD-10-CM | POA: Insufficient documentation

## 2016-08-24 MED ORDER — AMIODARONE HCL 200 MG PO TABS: 200.0000 mg | ORAL_TABLET | Freq: Every day | ORAL | 3 refills | Status: AC

## 2016-08-24 NOTE — Progress Notes (Signed)
Advanced Heart Failure Clinic Note   Referring Physician: Dr. Ree Kida Primary Physician: Dr. Salvadore Oxford Primary Cardiologist:  Dr. Clayburn Pert HF: Dr. Haroldine Laws   HPI:  Melissa Powers is a 71 y.o.  female with PMH of HFpEF, Afib on anticoagulation, CKD Stage 3, T2DM, HTN, HLD, and Parkinson's disease.  She had previously followed with Cardiology in [place] and was a part of a [place].  Admitted 8/1 -10/22/15 with worsening DOE and generalized weakness despite adjustment of outpatient diuretics.  CXR with CHF with mild interstitial edema. She was treated with IV diuretics with resolution of symptoms. Discharge weight 182 lbs. VEST reading on d/c 43% suggesting residual volume overload.   She presents today for regular follow up. At last visit was having palpitations. She wore monitor and we found several short runs SVT and an episode of 1:1 conducted AFL at 300bpm. Discussed with Dr. Rayann Heman and we started amiodarone 200 bid. Palpitations now resolved. Currently suffering from acute bronchitis. Got steroids and abx. Weight at home 201. Typically it is around 190. Weight here is stable. No edema or orthopnea. [facility]es to [facility] and weight and edema are stable.    Echo 10/20/15 with LVEF 55-60%, mild/mod MR, Mod LAE, mild RVH, mild RAE, PA peak pressure 69 mm Hg  Past Medical History:  Diagnosis Date  . Anxiety   . CHF (congestive heart failure) ([place])   . Chronic kidney disease (CKD), stage III (moderate)   . COPD (chronic obstructive pulmonary disease) ([place])   . Diastolic dysfunction with chronic heart failure ([place])   . Hyperlipidemia   . Hypertension   . Parkinson's disease ([place])    with deep brain stimulator  . Persistent atrial fibrillation ([place])   . Pneumonia 08/2015  . Sleep apnea    "tried mask; can't wear it" (10/19/2015)  . Type II diabetes mellitus ([place])     Current Outpatient Prescriptions  Medication Sig Dispense Refill  . amiodarone (PACERONE) 200 MG tablet Take 1  tablet (200mg ) by mouth twice a day for 14 days and then reduce to 1 tablet (200mg ) daily 45 tablet 3  . amLODipine (NORVASC) 5 MG tablet Take 1 tablet (5 mg total) by mouth daily. 30 tablet 6  . atorvastatin (LIPITOR) 20 MG tablet Take 10 mg by mouth daily.    . carbidopa-levodopa (SINEMET IR) 25-100 MG per tablet Take 1 tablet by mouth 3 (three) times daily.    . cholecalciferol (VITAMIN D) 1000 units tablet Take 2,000 Units by mouth daily.    . citalopram (CELEXA) 20 MG tablet Take 20 mg by mouth daily.    Marland Kitchen gabapentin (NEURONTIN) 100 MG capsule Take 100 mg by mouth daily.    . hydrALAZINE (APRESOLINE) 100 MG tablet Take 1 tablet (100 mg total) by mouth 3 (three) times daily. 90 tablet 6  . insulin glargine (LANTUS) 100 UNIT/ML injection Inject 10 Units into the skin at bedtime.    . meclizine (ANTIVERT) 12.5 MG tablet Take 1 tablet (12.5 mg total) by mouth 2 (two) times daily as needed for dizziness. 30 tablet 0  . metolazone (ZAROXOLYN) 5 MG tablet Take 5 mg by mouth as needed.    . phenol (CHLORASEPTIC) 1.4 % LIQD Use as directed 1 spray in the mouth or throat as needed for throat irritation / pain.  0  . potassium chloride SA (K-DUR,KLOR-CON) 20 MEQ tablet Take 1 tablet (20 mEq total) by mouth once a week. ON FRIDAYS. AND AS NEEDED WITH METOLAZONE DOSE  10 tablet 3  . predniSONE (DELTASONE) 20 MG tablet Take 20 mg by mouth as directed. TAKE 3 TABLETS BY MOUTH DAILY FOR 3 DAYS, 2 TABLETS DAILY FOR 3 DAYS, 1 TABLET DAILY FOR 3 DAYS, THEN 1/2 TABLET DAILY FOR 4 DAYS    . Rivaroxaban (XARELTO) 15 MG TABS tablet Take 15 mg by mouth daily with supper.    Marland Kitchen rOPINIRole (REQUIP) 3 MG tablet Take 3 mg by mouth at bedtime.  0  . torsemide (DEMADEX) 20 MG tablet Take 1 tablet (20 mg total) by mouth once a week. ON FRIDAYS, MAY TAKE AND ADDITIONAL TAB AS NEEDED FOR WEIGHT GREATER THAN 204 LBS 10 tablet 3  . traMADol (ULTRAM) 50 MG tablet Take 50 mg by mouth every 6 (six) hours as needed for moderate pain.     . traZODone (DESYREL) 50 MG tablet Take 50 mg by mouth at bedtime.     . valsartan (DIOVAN) 320 MG tablet Take 1 tablet (320 mg total) by mouth daily.    . benzonatate (TESSALON) 100 MG capsule Take 2 capsules by mouth as needed.  0  . CHERATUSSIN AC 100-10 MG/5ML syrup Take 5-10 mLs by mouth every 6 (six) hours as needed.  0  . PROAIR HFA 108 (90 Base) MCG/ACT inhaler Inhale 2 puffs into the lungs 4 (four) times daily as needed.  0   No current facility-administered medications for this encounter.    Facility-Administered Medications Ordered in Other Encounters  Medication Dose Route Frequency Provider Last Rate Last Dose  . 0.9 %  sodium chloride infusion    Continuous PRN Neldon Newport, CRNA 0 mL/hr at 12/03/15 1156      Allergies  Allergen Reactions  . Morphine     Other reaction(s): Mental Status Changes (intolerance)  . Morphine And Related Other (See Comments)    Pt reports intolerance due to sedation from morphine      Social History   Social History  . Marital status: Married    Spouse name: N/A  . Number of children: N/A  . Years of education: N/A   Occupational History  . Not on file.   Social History Main Topics  . Smoking status: Former Smoker    Quit date: 12/31/1983  . Smokeless tobacco: Never Used  . Alcohol use No  . Drug use: No  . Sexual activity: Not Currently   Other Topics Concern  . 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  Retired Quarry manager.    Vitals:   08/24/16 1358  BP: (!) 157/86  Pulse: 81  SpO2: 99%  Weight: 201 lb 3 oz (91.3 kg)   Wt Readings from Last 3 Encounters:  08/24/16 201 lb 3 oz (91.3 kg)  08/09/16 204 lb 12.8 oz (92.9 kg)  07/19/16 194 lb 12.8 oz (88.4 kg)    PHYSICAL EXAM: General:  Sitting on exam table. Mild wheeze. congested HEENT: normal Neck: supple. JVP 6-7  Carotids 2+ bilat; no bruits. No lymphadenopathy or thryomegaly appreciated. Cor: PMI nondisplaced. Regular rate  & rhythm. No rubs, gallops or murmurs. Lungs: mild wheeze Abdomen: obese soft, nontender, nondistended. No hepatosplenomegaly. No bruits or masses. Good bowel sounds. Extremities: no cyanosis, clubbing, rash, edema Neuro: alert & orientedx3, cranial nerves grossly intact. moves all 4 extremities w/o difficulty. Affect pleasant   Assessment/Plan    1. Chronic diastolic CHF - Echo 11/3808 LVEF 55-60% - Volume status looks stable on exam.  -  Stable NYHA III symptoms - Continue torsemide 20 mg every Friday with 20 meq of KCl. Can take extra of both with weight gain of 3 lbs overnight or 5 lbs within a week.   - Reinforced fluid restriction to < 2 L daily, sodium restriction to less than 2000 mg daily, and the importance of daily weights.    2. Chronic respiratory failure - Stable on chronic O2 walking into clinic.  Currently being treated for bronchitis 3. Paroxysmal Afib- S/P ablation  - Recent holter with 1:1 flutter. Improved with amio. Will decrease to 200 daily starting next week. (after bronchitis resolved) - Refer back to Dr. Rayann Heman.  - Recently started on amldopine 2.5  4. CKD stage III - IV - Stable on recent check.  5. T2DM with neuropathy - Per PCP.  6. HTN - Mildly elevated in clinic. In setting of current illness - Continue hydralazine 75 mg TID.  -Continue amlodipine to 5mg  daily. Can increase to 10 as needed 7. HLD - Per PCP.  8. Anxiety- Per PCP 9. Parkinsons with falls and weakness. - [facility].  - Has deep brain stimulator   Maryn Freelove, Quillian Quince, MD  2:13 PM

## 2016-08-24 NOTE — Patient Instructions (Signed)
DECREASE Amiodarone to 200mg  daily.  Follow up with Dr.Allred.  Follow up with Dr.Bensimhon in 3-4 months.

## 2016-08-28 ENCOUNTER — Ambulatory Visit (INDEPENDENT_AMBULATORY_CARE_PROVIDER_SITE_OTHER): Payer: Medicare (Managed Care) | Admitting: Internal Medicine

## 2016-08-28 ENCOUNTER — Encounter: Payer: Self-pay | Admitting: Internal Medicine

## 2016-08-28 ENCOUNTER — Encounter (INDEPENDENT_AMBULATORY_CARE_PROVIDER_SITE_OTHER): Payer: Self-pay

## 2016-08-28 VITALS — BP 128/54 | HR 80 | Ht 63.0 in | Wt 207.2 lb

## 2016-08-28 DIAGNOSIS — I4819 Other persistent atrial fibrillation: Secondary | ICD-10-CM

## 2016-08-28 DIAGNOSIS — I481 Persistent atrial fibrillation: Secondary | ICD-10-CM | POA: Diagnosis not present

## 2016-08-28 MED ORDER — METOPROLOL SUCCINATE ER 25 MG PO TB24: 25.0000 mg | ORAL_TABLET | Freq: Every day | ORAL | 3 refills | Status: DC

## 2016-08-28 NOTE — Addendum Note (Signed)
Addended by: Janan Halter F on: 08/28/2016 04:39 PM   Modules accepted: Orders

## 2016-08-28 NOTE — Progress Notes (Signed)
PCP: Marco Collie, MD Primary Cardiologist: Dr Hulan Saas Melissa Powers is a 71 y.o. female who presents today for routine electrophysiology followup.  Since last being seen in our clinic, the patient reports doing reasonably well.  She recently had monitor placed by Dr Haroldine Laws for palpitations.  She was found to have very rapidly conducting atrial arrhythmias.  She was placed on amiodarone.  She has done "much better" since that time.  She has had bronchitis for which she was placed on steroids.  She does not think that this is due to recently started amiodarone and feels that she is improving.  Today, she denies symptoms of chest pain, shortness of breath, lower extremity edema, dizziness, presyncope, or syncope.  The patient is otherwise without complaint today.   Past Medical History:  Diagnosis Date  . Anxiety   . CHF (congestive heart failure) ([place])   . Chronic kidney disease (CKD), stage III (moderate)   . COPD (chronic obstructive pulmonary disease) ([place])   . Diastolic dysfunction with chronic heart failure ([place])   . Hyperlipidemia   . Hypertension   . Parkinson's disease ([place])    with deep brain stimulator  . Persistent atrial fibrillation ([place])   . Pneumonia 08/2015  . Sleep apnea    "tried mask; can't wear it" (10/19/2015)  . Type II diabetes mellitus ([place])    Past Surgical History:  Procedure Laterality Date  . ABDOMINAL HYSTERECTOMY    . CARDIOVERSION N/A 12/22/2015   Procedure: CARDIOVERSION;  Surgeon: Jolaine Artist, MD;  Location: [facility] ENDOSCOPY;  Service: Cardiovascular;  Laterality: N/A;  . DEEP BRAIN STIMULATOR PLACEMENT  01/2015   for Parikinsons treatment  . ELECTROPHYSIOLOGIC STUDY N/A 02/03/2016   Procedure: Atrial Fibrillation Ablation;  Surgeon: Thompson Grayer, MD;  Location: [place] CV LAB;  Service: Cardiovascular;  Laterality: N/A;  . TEE WITHOUT CARDIOVERSION N/A 02/03/2016   Procedure: TRANSESOPHAGEAL ECHOCARDIOGRAM (TEE);  Surgeon: Larey Dresser,  MD;  Location: [place];  Service: Cardiovascular;  Laterality: N/A;  . TUBAL LIGATION      ROS- all systems are reviewed and negatives except as per HPI above  Current Outpatient Prescriptions  Medication Sig Dispense Refill  . amiodarone (PACERONE) 200 MG tablet Take 1 tablet (200 mg total) by mouth daily. 30 tablet 3  . amLODipine (NORVASC) 5 MG tablet Take 1 tablet (5 mg total) by mouth daily. 30 tablet 6  . atorvastatin (LIPITOR) 20 MG tablet Take 10 mg by mouth daily.    . benzonatate (TESSALON) 100 MG capsule Take 2 capsules by mouth as needed (Take as directed for cough).   0  . carbidopa-levodopa (SINEMET IR) 25-100 MG per tablet Take 1 tablet by mouth 3 (three) times daily.    Marland Kitchen CHERATUSSIN AC 100-10 MG/5ML syrup Take 5-10 mLs by mouth every 6 (six) hours as needed (Take as directed).   0  . cholecalciferol (VITAMIN D) 1000 units tablet Take 2,000 Units by mouth daily.    . citalopram (CELEXA) 20 MG tablet Take 20 mg by mouth daily.    Marland Kitchen gabapentin (NEURONTIN) 100 MG capsule Take 100 mg by mouth daily.    . hydrALAZINE (APRESOLINE) 100 MG tablet Take 1 tablet (100 mg total) by mouth 3 (three) times daily. 90 tablet 6  . insulin glargine (LANTUS) 100 UNIT/ML injection Inject 10 Units into the skin at bedtime.    . meclizine (ANTIVERT) 12.5 MG tablet Take 1 tablet (12.5 mg total) by mouth 2 (two) times daily  as needed for dizziness. 30 tablet 0  . metolazone (ZAROXOLYN) 5 MG tablet Take 5 mg by mouth as needed (Take as directed).     . phenol (CHLORASEPTIC) 1.4 % LIQD Use as directed 1 spray in the mouth or throat as needed for throat irritation / pain.  0  . potassium chloride SA (K-DUR,KLOR-CON) 20 MEQ tablet Take 1 tablet (20 mEq total) by mouth once a week. ON FRIDAYS. AND AS NEEDED WITH METOLAZONE DOSE 10 tablet 3  . predniSONE (DELTASONE) 20 MG tablet Take 20 mg by mouth as directed. TAKE 3 TABLETS BY MOUTH DAILY FOR 3 DAYS, 2 TABLETS DAILY FOR 3 DAYS, 1 TABLET DAILY FOR 3  DAYS, THEN 1/2 TABLET DAILY FOR 4 DAYS    . PROAIR HFA 108 (90 Base) MCG/ACT inhaler Inhale 2 puffs into the lungs 4 (four) times daily as needed for wheezing or shortness of breath.   0  . Rivaroxaban (XARELTO) 15 MG TABS tablet Take 15 mg by mouth daily with supper.    Marland Kitchen rOPINIRole (REQUIP) 3 MG tablet Take 3 mg by mouth at bedtime.  0  . torsemide (DEMADEX) 20 MG tablet Take 1 tablet (20 mg total) by mouth once a week. ON FRIDAYS, MAY TAKE AND ADDITIONAL TAB AS NEEDED FOR WEIGHT GREATER THAN 204 LBS 10 tablet 3  . traMADol (ULTRAM) 50 MG tablet Take 50 mg by mouth every 6 (six) hours as needed for moderate pain.    . traZODone (DESYREL) 50 MG tablet Take 50 mg by mouth at bedtime.     . valsartan (DIOVAN) 320 MG tablet Take 1 tablet (320 mg total) by mouth daily.     No current facility-administered medications for this visit.    Facility-Administered Medications Ordered in Other Visits  Medication Dose Route Frequency Provider Last Rate Last Dose  . 0.9 %  sodium chloride infusion    Continuous PRN Neldon Newport, CRNA 0 mL/hr at 12/03/15 1156      Physical Exam: Vitals:   08/28/16 1622  BP: (!) 128/54  Pulse: 80  SpO2: 96%  Weight: 207 lb 3.2 oz (94 kg)  Height: 5\' 3"  (1.6 m)    GEN- The patient is overweight appearing, alert and oriented x 3 today.   Head- normocephalic, atraumatic Eyes-  Sclera clear, conjunctiva pink Ears- hearing intact Oropharynx- clear Lungs- Clear to ausculation bilaterally, normal work of breathing Heart- Regular rate and rhythm, 2/6 SEM LUSB GI- soft, NT, ND, + BS Extremities- no clubbing, cyanosis, or edema  EKG tracing ordered today is personally reviewed and shows sinus rhythm, baseline artifact  Assessment and Plan:  1. Persistent afib Mostly maintaining sinus rhythm post ablation She has had some rapidly conducting atrial arrhythmias documented recently. She has been started on amiodarone and is improving.  She will reduce amiodarone  to 200mg  daily later this week. I have added toprol XL 25mg  daily today for rate controel. Continue long term anticoagulation  Return in 3 months for follow-up  Thompson Grayer MD, [facility] 08/28/2016 4:24 PM

## 2016-08-28 NOTE — Patient Instructions (Signed)
Medication Instructions:  Your physician has recommended you make the following change in your medication:  1) Start Metoprolol XL 25 mg daily   Labwork: None ordered   Testing/Procedures: None ordered   Follow-Up: Your physician recommends that you schedule a follow-up appointment in: 3 months with Dr Rayann Heman   Any Other Special Instructions Will Be Listed Below (If Applicable).     If you need a refill on your cardiac medications before your next appointment, please call your pharmacy.

## 2016-10-30 ENCOUNTER — Ambulatory Visit (HOSPITAL_COMMUNITY)
Admission: RE | Admit: 2016-10-30 | Discharge: 2016-10-30 | Disposition: A | Discharge status: Home / Self Care | Payer: Medicare (Managed Care) | Source: Ambulatory Visit | Attending: Internal Medicine | Admitting: Internal Medicine

## 2016-10-30 VITALS — BP 174/78 | HR 69 | Wt 209.5 lb

## 2016-10-30 DIAGNOSIS — R296 Repeated falls: Secondary | ICD-10-CM | POA: Insufficient documentation

## 2016-10-30 DIAGNOSIS — I4891 Unspecified atrial fibrillation: Secondary | ICD-10-CM | POA: Diagnosis not present

## 2016-10-30 DIAGNOSIS — Z885 Allergy status to narcotic agent status: Secondary | ICD-10-CM | POA: Diagnosis not present

## 2016-10-30 DIAGNOSIS — R002 Palpitations: Secondary | ICD-10-CM | POA: Insufficient documentation

## 2016-10-30 DIAGNOSIS — I10 Essential (primary) hypertension: Secondary | ICD-10-CM | POA: Diagnosis not present

## 2016-10-30 DIAGNOSIS — G20 Parkinson's disease: Secondary | ICD-10-CM | POA: Diagnosis not present

## 2016-10-30 DIAGNOSIS — F419 Anxiety disorder, unspecified: Secondary | ICD-10-CM | POA: Insufficient documentation

## 2016-10-30 DIAGNOSIS — G4730 Sleep apnea, unspecified: Secondary | ICD-10-CM | POA: Diagnosis not present

## 2016-10-30 DIAGNOSIS — E785 Hyperlipidemia, unspecified: Secondary | ICD-10-CM | POA: Insufficient documentation

## 2016-10-30 DIAGNOSIS — Z79899 Other long term (current) drug therapy: Secondary | ICD-10-CM | POA: Insufficient documentation

## 2016-10-30 DIAGNOSIS — R531 Weakness: Secondary | ICD-10-CM | POA: Insufficient documentation

## 2016-10-30 DIAGNOSIS — J9610 Chronic respiratory failure, unspecified whether with hypoxia or hypercapnia: Secondary | ICD-10-CM | POA: Insufficient documentation

## 2016-10-30 DIAGNOSIS — J449 Chronic obstructive pulmonary disease, unspecified: Secondary | ICD-10-CM | POA: Diagnosis not present

## 2016-10-30 DIAGNOSIS — I481 Persistent atrial fibrillation: Secondary | ICD-10-CM | POA: Diagnosis not present

## 2016-10-30 DIAGNOSIS — Z7901 Long term (current) use of anticoagulants: Secondary | ICD-10-CM | POA: Insufficient documentation

## 2016-10-30 DIAGNOSIS — I130 Hypertensive heart and chronic kidney disease with heart failure and stage 1 through stage 4 chronic kidney disease, or unspecified chronic kidney disease: Secondary | ICD-10-CM | POA: Diagnosis not present

## 2016-10-30 DIAGNOSIS — I471 Supraventricular tachycardia: Secondary | ICD-10-CM | POA: Insufficient documentation

## 2016-10-30 DIAGNOSIS — I480 Paroxysmal atrial fibrillation: Secondary | ICD-10-CM | POA: Diagnosis not present

## 2016-10-30 DIAGNOSIS — Z9981 Dependence on supplemental oxygen: Secondary | ICD-10-CM | POA: Diagnosis not present

## 2016-10-30 DIAGNOSIS — N184 Chronic kidney disease, stage 4 (severe): Secondary | ICD-10-CM | POA: Diagnosis not present

## 2016-10-30 DIAGNOSIS — Z87891 Personal history of nicotine dependence: Secondary | ICD-10-CM | POA: Diagnosis not present

## 2016-10-30 DIAGNOSIS — E1122 Type 2 diabetes mellitus with diabetic chronic kidney disease: Secondary | ICD-10-CM | POA: Diagnosis not present

## 2016-10-30 DIAGNOSIS — I5032 Chronic diastolic (congestive) heart failure: Secondary | ICD-10-CM | POA: Diagnosis not present

## 2016-10-30 DIAGNOSIS — Z794 Long term (current) use of insulin: Secondary | ICD-10-CM | POA: Insufficient documentation

## 2016-10-30 MED ORDER — TORSEMIDE 20 MG PO TABS: 20.0000 mg | ORAL_TABLET | ORAL | 3 refills | Status: DC

## 2016-10-30 MED ORDER — AMLODIPINE BESYLATE 10 MG PO TABS: 10.0000 mg | ORAL_TABLET | Freq: Every day | ORAL | 6 refills | Status: AC

## 2016-10-30 NOTE — Patient Instructions (Signed)
Increase Amlodipine to 10 mg daily  Increase Torsemide to 20 mg every Monday and Friday  Labs in 2 weeks  Your physician recommends that you schedule a follow-up appointment in: 3-4 months

## 2016-10-30 NOTE — Progress Notes (Signed)
Advanced Heart Failure Clinic Note   Referring Physician: Dr. Ree Kida Primary Physician: Dr. Salvadore Oxford Primary Cardiologist:  Dr. Clayburn Pert HF: Dr. Haroldine Laws   HPI:  Melissa Powers is a 71 y.o.  female with PMH of HFpEF, Afib on anticoagulation, CKD Stage 3, T2DM, HTN, HLD, and Parkinson's disease.  She had previously followed with Cardiology in [place] and was a part of a [employer].  Admitted 8/1 -10/22/15 with worsening DOE and generalized weakness despite adjustment of outpatient diuretics.  CXR with CHF with mild interstitial edema. She was treated with IV diuretics with resolution of symptoms. Discharge weight 182 lbs. VEST reading on d/c 43% suggesting residual volume overload.   At last visit was having palpitations. She wore monitor in 5/18 and we found several short runs SVT and an episode of 1:1 conducted AFL at 300bpm. Discussed with Dr. Rayann Heman and we started amiodarone 200 bid. Saw Dr. Rayann Heman in follow-up and amio decreased to 200 daily. Toprol added.   She presents today for regular follow up. Palpitations now resolved. Says she has not had any palpitations in at least 6 weeks. Feels she is doing well. Says she is week. Going to [facility] and exercising 3x/week. Riding stationary bike and working on balance. Uses a walker or cane. Has had several falls recently. No major injury.  Weight at home stable at 207-208. Struggles with fluid retention. Only taking torsemide 1x/week. Took 2x/last week.   ECG: NSR 61 artifact from brain stimulator  Echo 10/20/15 with LVEF 55-60%, mild/mod MR, Mod LAE, mild RVH, mild RAE, PA peak pressure 69 mm Hg  Past Medical History:  Diagnosis Date  . Anxiety   . CHF (congestive heart failure) ([place])   . Chronic kidney disease (CKD), stage III (moderate)   . COPD (chronic obstructive pulmonary disease) ([place])   . Diastolic dysfunction with chronic heart failure ([place])   . Hyperlipidemia   . Hypertension   . Parkinson's disease ([place])    with deep brain stimulator  . Persistent atrial fibrillation ([place])   . Pneumonia 08/2015  . Sleep apnea    "tried mask; can't wear it" (10/19/2015)  . Type II diabetes mellitus ([place])     Current Outpatient Prescriptions  Medication Sig Dispense Refill  . amiodarone (PACERONE) 200 MG tablet Take 1 tablet (200 mg total) by mouth daily. 30 tablet 3  . amLODipine (NORVASC) 5 MG tablet Take 1 tablet (5 mg total) by mouth daily. 30 tablet 6  . benzonatate (TESSALON) 100 MG capsule Take 2 capsules by mouth as needed (Take as directed for cough).   0  . carbidopa-levodopa (SINEMET IR) 25-100 MG per tablet Take 1 tablet by mouth 3 (three) times daily.    Marland Kitchen CHERATUSSIN AC 100-10 MG/5ML syrup Take 5-10 mLs by mouth every 6 (six) hours as needed (Take as directed).   0  . Cholecalciferol (VITAMIN D-3) 5000 units TABS Take 1 tablet by mouth daily.    . citalopram (CELEXA) 20 MG tablet Take 20 mg by mouth daily.    Marland Kitchen gabapentin (NEURONTIN) 300 MG capsule Take 300 mg by mouth at bedtime.    . hydrALAZINE (APRESOLINE) 100 MG tablet Take 1 tablet (100 mg total) by mouth 3 (three) times daily. 90 tablet 6  . insulin glargine (LANTUS) 100 UNIT/ML injection Inject 15 Units into the skin at bedtime.    . irbesartan (AVAPRO) 300 MG tablet Take 300 mg by mouth daily.    . meclizine (ANTIVERT) 12.5 MG tablet  Take 1 tablet (12.5 mg total) by mouth 2 (two) times daily as needed for dizziness. 30 tablet 0  . metolazone (ZAROXOLYN) 5 MG tablet Take 5 mg by mouth as needed (Take as directed).     . metoprolol succinate (TOPROL-XL) 50 MG 24 hr tablet Take 50 mg by mouth daily. Take with or immediately following a meal.    . phenol (CHLORASEPTIC) 1.4 % LIQD Use as directed 1 spray in the mouth or throat as needed for throat irritation / pain.  0  . potassium chloride SA (K-DUR,KLOR-CON) 20 MEQ tablet Take 1 tablet (20 mEq total) by mouth once a week. ON FRIDAYS. AND AS NEEDED WITH METOLAZONE DOSE 10 tablet 3  . PROAIR HFA  108 (90 Base) MCG/ACT inhaler Inhale 2 puffs into the lungs 4 (four) times daily as needed for wheezing or shortness of breath.   0  . Rivaroxaban (XARELTO) 15 MG TABS tablet Take 15 mg by mouth daily with supper.    Marland Kitchen rOPINIRole (REQUIP) 3 MG tablet Take 3 mg by mouth at bedtime.  0  . torsemide (DEMADEX) 20 MG tablet Take 1 tablet (20 mg total) by mouth once a week. ON FRIDAYS, MAY TAKE AND ADDITIONAL TAB AS NEEDED FOR WEIGHT GREATER THAN 204 LBS 10 tablet 3  . traMADol (ULTRAM) 50 MG tablet Take 50 mg by mouth every 6 (six) hours as needed for moderate pain.    . traZODone (DESYREL) 50 MG tablet Take 50 mg by mouth at bedtime.      No current facility-administered medications for this encounter.    Facility-Administered Medications Ordered in Other Encounters  Medication Dose Route Frequency Provider Last Rate Last Dose  . 0.9 %  sodium chloride infusion    Continuous PRN Neldon Newport, CRNA 0 mL/hr at 12/03/15 1156      Allergies  Allergen Reactions  . Morphine     Mental Status Changes (intolerance)  . Morphine And Related Other (See Comments)    Pt reports intolerance due to sedation from morphine      Social History   Social History  . Marital status: Married    Spouse name: N/A  . Number of children: N/A  . Years of education: N/A   Occupational History  . Not on file.   Social History Main Topics  . Smoking status: Former Smoker    Quit date: 12/31/1983  . Smokeless tobacco: Never Used  . Alcohol use No  . Drug use: No  . Sexual activity: Not Currently   Other Topics Concern  . Not on file   Social History Narrative   Pt lives in [place] with spouse.  Retired Quarry manager.    Vitals:   10/30/16 1345  BP: (!) 174/78  Pulse: 69  SpO2: 94%  Weight: 209 lb 8 oz (95 kg)   Wt Readings from Last 3 Encounters:  10/30/16 209 lb 8 oz (95 kg)  08/28/16 207 lb 3.2 oz (94 kg)  08/24/16 201 lb 3 oz (91.3 kg)    PHYSICAL EXAM: General:  Sitting on exam table. No  resp difficulty HEENT: normal Neck: supple. JVP 7. Carotids 2+ bilat; no bruits. No lymphadenopathy or thryomegaly appreciated. Cor: PMI nondisplaced. Regular rate & rhythm. 2/6 TR Lungs: clear Abdomen: obese soft, nontender, nondistended. No hepatosplenomegaly. No bruits or masses. Good bowel sounds. Extremities: no cyanosis, clubbing, rash, edema  Neuro: alert & orientedx3, cranial nerves grossly intact. moves all 4 extremities w/o difficulty. + tremor Affect pleasant  Assessment/Plan    1. Chronic diastolic CHF - Echo 05/6466 LVEF 55-60% - Volume status minimally elevated. Still struggles with limiting her fluid intake. - Stable NYHA III symptoms - Increase torsemide to 20 mg every Monday and Friday with 20 meq of KCl. Can take extra of both with weight gain of 3 lbs overnight or 5 lbs within a week.   - Reinforced fluid restriction to < 2 L daily, sodium restriction to less than 2000 mg daily, and the importance of daily weights.    2. Chronic respiratory failure - Stable on chronic O2 walking into clinic.  Currently being treated for bronchitis 3. Paroxysmal Afib- S/P ablation  - F/u holter with 1:1 flutter. Palpitations much improved on amio. Continue 200mg  daily. Follwoed by Dr. Rayann Heman as well.  - Continue Xarelto for now. Stressed need to use walker for balance support. If continues to fall may need to revisit ability to anti-coagulate.  -This patients CHA2DS2-VASc Score and unadjusted Ischemic Stroke Rate (% per year) is equal to 7.2 % stroke rate/year from a score of 5 4. CKD stage III - IV - Stable on recent check.  - Recheck in 2 weeks 5. T2DM with neuropathy - Per PCP.  6. HTN - Remains elevated. Increase amlodipine to 10 daily.  - Continue hydralazine 100 mg TID.  7. HLD  - Atorvastatin stopped by PCP. With DM2 would consider restarting.  8. Anxiety- Per PCP 9. Parkinsons with falls and weakness. - Continue Rehab via the [facility] program.  - Has deep brain stimulator     Ofilia Rayon, Quillian Quince, MD  2:01 PM

## 2016-11-10 ENCOUNTER — Encounter: Payer: Self-pay | Admitting: Internal Medicine

## 2016-11-30 ENCOUNTER — Ambulatory Visit: Payer: Medicare (Managed Care) | Admitting: Internal Medicine

## 2016-12-03 NOTE — Progress Notes (Signed)
Cardiology Office Note Date:  12/07/2016  Patient ID:  Melissa Powers, Melissa Powers [birth year], MRN [id number] PCP:  Marco Collie, MD  Cardiologist:  Dr. Haroldine Laws Electrophysiologist: Dr. Rayann Heman   Chief Complaint: planned EP f/u  History of Present Illness: Melissa Powers is a 71 y.o. female with history of HFpEF, PAFib, CRI (III), DM, HLD, HTN, Parkinon's disease s/p DBS a,last year nd doing much better and particiating in [facility] program goes to her clinic 3x week, 1:1 SVT tx with amiodarone/BB, chronic respiratory failure on home O2.  She comes in today to be see for Dr. Rayann Heman, last seen by him in June at that visit BB was added to tx for rate control.  She follows closely with CHF team, saw Dr. Haroldine Laws last month with slight up-titration in her diuresis.   She has not felt any kind palpitations or felt like she has had any AF in at least 2 months.  No CP or SOB though continues to have difficulty with fluid retention despite the change in her diuretic.  She sees Dr. Tressa Busman (PMD) and the [facility] senior clinic every monday, and was instructed to take 4tabs (80mg ) of her torsemide daily for 4 days (today was the last), and resume 20mg  2x week afterwards with a rapid increase in her weight and edema , she had labs there yesterday and states they were going to forward to Dr. Clayborne Dana office as well.  No dizziness, near syncope or syncope.   She feels like her swelling is much improved today, no DOE or rest SOB.  She would be due now for her next routine dose on Monday.   Past Medical History:  Diagnosis Date  . Anxiety   . CHF (congestive heart failure) ([place])   . Chronic kidney disease (CKD), stage III (moderate)   . COPD (chronic obstructive pulmonary disease) ([place])   . Diastolic dysfunction with chronic heart failure ([place])   . Hyperlipidemia   . Hypertension   . Parkinson's disease ([place])    with deep brain stimulator  . Persistent atrial fibrillation ([place])   . Pneumonia 08/2015  . Sleep apnea     "tried mask; can't wear it" (10/19/2015)  . Type II diabetes mellitus ([place])     Past Surgical History:  Procedure Laterality Date  . ABDOMINAL HYSTERECTOMY    . CARDIOVERSION N/A 12/22/2015   Procedure: CARDIOVERSION;  Surgeon: Jolaine Artist, MD;  Location: [facility] ENDOSCOPY;  Service: Cardiovascular;  Laterality: N/A;  . DEEP BRAIN STIMULATOR PLACEMENT  01/2015   for Parikinsons treatment  . ELECTROPHYSIOLOGIC STUDY N/A 02/03/2016   Procedure: Atrial Fibrillation Ablation;  Surgeon: Thompson Grayer, MD;  Location: [place] CV LAB;  Service: Cardiovascular;  Laterality: N/A;  . TEE WITHOUT CARDIOVERSION N/A 02/03/2016   Procedure: TRANSESOPHAGEAL ECHOCARDIOGRAM (TEE);  Surgeon: Larey Dresser, MD;  Location: [place];  Service: Cardiovascular;  Laterality: N/A;  . TUBAL LIGATION      Current Outpatient Prescriptions  Medication Sig Dispense Refill  . amiodarone (PACERONE) 200 MG tablet Take 1 tablet (200 mg total) by mouth daily. 30 tablet 3  . amLODipine (NORVASC) 10 MG tablet Take 1 tablet (10 mg total) by mouth daily. 30 tablet 6  . benzonatate (TESSALON) 100 MG capsule Take 2 capsules by mouth as needed (Take as directed for cough).   0  . carbidopa-levodopa (SINEMET IR) 25-100 MG per tablet Take 1 tablet by mouth 3 (three) times daily.    Marland Kitchen CHERATUSSIN AC 100-10 MG/5ML syrup Take  5-10 mLs by mouth every 6 (six) hours as needed (Take as directed).   0  . Cholecalciferol (VITAMIN D-3) 5000 units TABS Take 1 tablet by mouth daily.    . citalopram (CELEXA) 20 MG tablet Take 20 mg by mouth daily.    . furosemide (LASIX) 80 MG tablet Take 80 mg by mouth 2 (two) times daily.  0  . gabapentin (NEURONTIN) 300 MG capsule Take 300 mg by mouth at bedtime.    . hydrALAZINE (APRESOLINE) 100 MG tablet Take 1 tablet (100 mg total) by mouth 3 (three) times daily. 90 tablet 6  . insulin glargine (LANTUS) 100 UNIT/ML injection Inject 15 Units into the skin at bedtime.    . irbesartan (AVAPRO)  300 MG tablet Take 300 mg by mouth daily.    . meclizine (ANTIVERT) 12.5 MG tablet Take 1 tablet (12.5 mg total) by mouth 2 (two) times daily as needed for dizziness. 30 tablet 0  . metolazone (ZAROXOLYN) 5 MG tablet Take 5 mg by mouth as needed (Take as directed).     . metoprolol succinate (TOPROL-XL) 50 MG 24 hr tablet Take 50 mg by mouth daily. Take with or immediately following a meal.    . phenol (CHLORASEPTIC) 1.4 % LIQD Use as directed 1 spray in the mouth or throat as needed for throat irritation / pain.  0  . potassium chloride SA (K-DUR,KLOR-CON) 20 MEQ tablet Take 1 tablet (20 mEq total) by mouth once a week. ON FRIDAYS. AND AS NEEDED WITH METOLAZONE DOSE 10 tablet 3  . PROAIR HFA 108 (90 Base) MCG/ACT inhaler Inhale 2 puffs into the lungs 4 (four) times daily as needed for wheezing or shortness of breath.   0  . Rivaroxaban (XARELTO) 15 MG TABS tablet Take 15 mg by mouth daily with supper.    Marland Kitchen rOPINIRole (REQUIP) 3 MG tablet Take 3 mg by mouth at bedtime.  0  . traMADol (ULTRAM) 50 MG tablet Take 50 mg by mouth every 6 (six) hours as needed for moderate pain.    . traZODone (DESYREL) 50 MG tablet Take 50 mg by mouth at bedtime.      No current facility-administered medications for this visit.    Facility-Administered Medications Ordered in Other Visits  Medication Dose Route Frequency Provider Last Rate Last Dose  . 0.9 %  sodium chloride infusion    Continuous PRN Neldon Newport, CRNA 0 mL/hr at 12/03/15 1156      Allergies:   Morphine and Morphine and related   Social History:  The patient  reports that she quit smoking about 32 years ago. She has never used smokeless tobacco. She reports that she does not drink alcohol or use drugs.   Family History:  The patient's family history includes Dementia in her mother; Heart Problems in her father; Kidney failure in her brother; Stroke in her sister.  ROS:  Please see the history of present illness.   All other systems are  reviewed and otherwise negative.   PHYSICAL EXAM:  VS:  Ht 5\' 3"  (1.6 m)   Wt 219 lb (99.3 kg)   BMI 38.79 kg/m  BMI: Body mass index is 38.79 kg/m. Well nourished, well developed, in no acute distress  HEENT: normocephalic, atraumatic  Neck: no JVD, carotid bruits or masses Cardiac:  RRR; no significant murmurs, no rubs, or gallops Lungs:  CTA b/l, no wheezing, rhonchi or rales  Abd: soft, nontender MS: no deformity or atrophy Ext: trace edema if any  is appreciated today  Skin: warm and dry, no rash Neuro:  No gross deficits appreciated Psych: euthymic mood, full affect   EKG:  Done 10/30/16 was SR 61bpm  Recent Labs: 12/20/2015: ALT 13; TSH 1.575 03/17/2016: Magnesium 2.7 03/22/2016: B Natriuretic Peptide 190.7 04/20/2016: BUN 17; Creatinine, Ser 1.39; Hemoglobin 11.3; Platelets 208; Potassium 4.3; Sodium 136  No results found for requested labs within last 8760 hours.   CrCl cannot be calculated (Patient's most recent lab result is older than the maximum 21 days allowed.).   Wt Readings from Last 3 Encounters:  12/07/16 219 lb (99.3 kg)  10/30/16 209 lb 8 oz (95 kg)  08/28/16 207 lb 3.2 oz (94 kg)     Other studies reviewed: Additional studies/records reviewed today include: summarized above  ASSESSMENT AND PLAN:  1. Persistent AFib     CHA2DS2Vasc is at least 3, on Xarelto.  Her Creat Clearance seems to wax/wane some.  She had labs dine yesterday with her PMD     Seems to be holding SR the last couple months   BUN/Creat from labs yesterday 72/2.60 (clearance is 31)  2. HTN     Looks ok, no changes made  3. HFpEF     Discussed sodium restriction and fluid restriction though it seems she is doing a pretty good job of it     I will leave her diuresis to Dr. Haroldine Laws and her PMD who have been managing     She sees PMD every Monday, she states she is expecting a call   Creat significantly elevated from her baseline, K+ is 4.3 She goes to her clinic tomorrow, and  will be able to see PMD as well and will get further diuretic guidance tomorrow.    A little confusing because she reports good diuresis and her edema is all but resolved though her weight remains up (was a swift increase), she denies bloating/tight waistband or signs of fluid retention.   Disposition: F/u with PMD tomorrow, and Dr. Haroldine Laws as well. EP will see her back in 6 months, sooner if needed.  Current medicines are reviewed at length with the patient today.  The patient did not have any concerns regarding medicines.  Venetia Night, PA-C 12/07/2016 1:20 PM     [address]  [place] [address] [phone] (office)  [phone] (fax)

## 2016-12-07 ENCOUNTER — Ambulatory Visit (INDEPENDENT_AMBULATORY_CARE_PROVIDER_SITE_OTHER): Payer: Medicare (Managed Care) | Admitting: Physician Assistant

## 2016-12-07 VITALS — BP 126/62 | HR 58 | Ht 63.0 in | Wt 219.0 lb

## 2016-12-07 DIAGNOSIS — I4819 Other persistent atrial fibrillation: Secondary | ICD-10-CM

## 2016-12-07 DIAGNOSIS — I10 Essential (primary) hypertension: Secondary | ICD-10-CM

## 2016-12-07 DIAGNOSIS — I5030 Unspecified diastolic (congestive) heart failure: Secondary | ICD-10-CM | POA: Diagnosis not present

## 2016-12-07 DIAGNOSIS — I481 Persistent atrial fibrillation: Secondary | ICD-10-CM

## 2016-12-07 NOTE — Patient Instructions (Addendum)
Medication Instructions:   Your physician recommends that you continue on your current medications as directed. Please refer to the Current Medication list given to you today.    If you need a refill on your cardiac medications before your next appointment, please call your pharmacy.  Labwork: NONE ORDERED  TODAY    Testing/Procedures: NONE ORDERED  TODAY    Follow-Up:  Your physician wants you to follow-up in:  IN  6 MONTHS WITH DR ALLRED   You will receive a reminder letter in the mail two months in advance. If you don't receive a letter, please call our office to schedule the follow-up appointment.      Any Other Special Instructions Will Be Listed Below (If Applicable).                                                                                                                                                   

## 2016-12-09 ENCOUNTER — Inpatient Hospital Stay (HOSPITAL_COMMUNITY)
Admission: EM | Admit: 2016-12-09 | Discharge: 2016-12-12 | DRG: 871 | Disposition: A | Discharge status: Home / Self Care | Payer: Medicare (Managed Care) | Attending: Family Medicine | Admitting: Family Medicine

## 2016-12-09 ENCOUNTER — Emergency Department (HOSPITAL_COMMUNITY): Payer: Medicare (Managed Care)

## 2016-12-09 ENCOUNTER — Encounter (HOSPITAL_COMMUNITY): Payer: Self-pay

## 2016-12-09 DIAGNOSIS — N184 Chronic kidney disease, stage 4 (severe): Secondary | ICD-10-CM | POA: Diagnosis present

## 2016-12-09 DIAGNOSIS — G4733 Obstructive sleep apnea (adult) (pediatric): Secondary | ICD-10-CM | POA: Diagnosis present

## 2016-12-09 DIAGNOSIS — R159 Full incontinence of feces: Secondary | ICD-10-CM | POA: Diagnosis present

## 2016-12-09 DIAGNOSIS — J440 Chronic obstructive pulmonary disease with acute lower respiratory infection: Secondary | ICD-10-CM | POA: Diagnosis present

## 2016-12-09 DIAGNOSIS — Z794 Long term (current) use of insulin: Secondary | ICD-10-CM | POA: Diagnosis not present

## 2016-12-09 DIAGNOSIS — A419 Sepsis, unspecified organism: Principal | ICD-10-CM | POA: Diagnosis present

## 2016-12-09 DIAGNOSIS — B9789 Other viral agents as the cause of diseases classified elsewhere: Secondary | ICD-10-CM | POA: Diagnosis present

## 2016-12-09 DIAGNOSIS — G20 Parkinson's disease: Secondary | ICD-10-CM | POA: Diagnosis present

## 2016-12-09 DIAGNOSIS — I480 Paroxysmal atrial fibrillation: Secondary | ICD-10-CM | POA: Diagnosis present

## 2016-12-09 DIAGNOSIS — E1129 Type 2 diabetes mellitus with other diabetic kidney complication: Secondary | ICD-10-CM | POA: Diagnosis present

## 2016-12-09 DIAGNOSIS — D631 Anemia in chronic kidney disease: Secondary | ICD-10-CM | POA: Diagnosis present

## 2016-12-09 DIAGNOSIS — J9621 Acute and chronic respiratory failure with hypoxia: Secondary | ICD-10-CM | POA: Diagnosis present

## 2016-12-09 DIAGNOSIS — Z87891 Personal history of nicotine dependence: Secondary | ICD-10-CM

## 2016-12-09 DIAGNOSIS — N390 Urinary tract infection, site not specified: Secondary | ICD-10-CM | POA: Diagnosis present

## 2016-12-09 DIAGNOSIS — Z7901 Long term (current) use of anticoagulants: Secondary | ICD-10-CM

## 2016-12-09 DIAGNOSIS — I5033 Acute on chronic diastolic (congestive) heart failure: Secondary | ICD-10-CM | POA: Diagnosis present

## 2016-12-09 DIAGNOSIS — J9601 Acute respiratory failure with hypoxia: Secondary | ICD-10-CM

## 2016-12-09 DIAGNOSIS — F32A Depression, unspecified: Secondary | ICD-10-CM | POA: Diagnosis present

## 2016-12-09 DIAGNOSIS — E785 Hyperlipidemia, unspecified: Secondary | ICD-10-CM | POA: Diagnosis present

## 2016-12-09 DIAGNOSIS — Z23 Encounter for immunization: Secondary | ICD-10-CM | POA: Diagnosis present

## 2016-12-09 DIAGNOSIS — Z79899 Other long term (current) drug therapy: Secondary | ICD-10-CM | POA: Diagnosis not present

## 2016-12-09 DIAGNOSIS — I130 Hypertensive heart and chronic kidney disease with heart failure and stage 1 through stage 4 chronic kidney disease, or unspecified chronic kidney disease: Secondary | ICD-10-CM | POA: Diagnosis present

## 2016-12-09 DIAGNOSIS — Z885 Allergy status to narcotic agent status: Secondary | ICD-10-CM

## 2016-12-09 DIAGNOSIS — I340 Nonrheumatic mitral (valve) insufficiency: Secondary | ICD-10-CM | POA: Diagnosis not present

## 2016-12-09 DIAGNOSIS — E1122 Type 2 diabetes mellitus with diabetic chronic kidney disease: Secondary | ICD-10-CM | POA: Diagnosis not present

## 2016-12-09 DIAGNOSIS — I10 Essential (primary) hypertension: Secondary | ICD-10-CM | POA: Diagnosis not present

## 2016-12-09 DIAGNOSIS — J441 Chronic obstructive pulmonary disease with (acute) exacerbation: Secondary | ICD-10-CM | POA: Diagnosis present

## 2016-12-09 DIAGNOSIS — J399 Disease of upper respiratory tract, unspecified: Secondary | ICD-10-CM | POA: Diagnosis present

## 2016-12-09 DIAGNOSIS — F329 Major depressive disorder, single episode, unspecified: Secondary | ICD-10-CM | POA: Diagnosis present

## 2016-12-09 DIAGNOSIS — I4891 Unspecified atrial fibrillation: Secondary | ICD-10-CM | POA: Diagnosis present

## 2016-12-09 DIAGNOSIS — I482 Chronic atrial fibrillation: Secondary | ICD-10-CM | POA: Diagnosis not present

## 2016-12-09 DIAGNOSIS — F419 Anxiety disorder, unspecified: Secondary | ICD-10-CM | POA: Diagnosis present

## 2016-12-09 DIAGNOSIS — N179 Acute kidney failure, unspecified: Secondary | ICD-10-CM

## 2016-12-09 LAB — I-STAT CHEM 8, ED
BUN: 45 mg/dL — ABNORMAL HIGH (ref 6–20)
Calcium, Ion: 1.11 mmol/L — ABNORMAL LOW (ref 1.15–1.40)
Chloride: 100 mmol/L — ABNORMAL LOW (ref 101–111)
Creatinine, Ser: 2 mg/dL — ABNORMAL HIGH (ref 0.44–1.00)
GLUCOSE: 119 mg/dL — AB (ref 65–99)
HEMATOCRIT: 33 % — AB (ref 36.0–46.0)
HEMOGLOBIN: 11.2 g/dL — AB (ref 12.0–15.0)
POTASSIUM: 4.6 mmol/L (ref 3.5–5.1)
SODIUM: 138 mmol/L (ref 135–145)
TCO2: 29 mmol/L (ref 22–32)

## 2016-12-09 LAB — I-STAT CG4 LACTIC ACID, ED: LACTIC ACID, VENOUS: 0.97 mmol/L (ref 0.5–1.9)

## 2016-12-09 LAB — CBC WITH DIFFERENTIAL/PLATELET
BASOS ABS: 0 10*3/uL (ref 0.0–0.1)
BASOS PCT: 0 %
EOS ABS: 0.1 10*3/uL (ref 0.0–0.7)
Eosinophils Relative: 1 %
HEMATOCRIT: 34.8 % — AB (ref 36.0–46.0)
Hemoglobin: 10.6 g/dL — ABNORMAL LOW (ref 12.0–15.0)
Lymphocytes Relative: 14 %
Lymphs Abs: 1.2 10*3/uL (ref 0.7–4.0)
MCH: 26.4 pg (ref 26.0–34.0)
MCHC: 30.5 g/dL (ref 30.0–36.0)
MCV: 86.8 fL (ref 78.0–100.0)
MONO ABS: 0.6 10*3/uL (ref 0.1–1.0)
Monocytes Relative: 7 %
NEUTROS ABS: 6.7 10*3/uL (ref 1.7–7.7)
Neutrophils Relative %: 78 %
PLATELETS: 202 10*3/uL (ref 150–400)
RBC: 4.01 MIL/uL (ref 3.87–5.11)
RDW: 14.4 % (ref 11.5–15.5)
WBC: 8.5 10*3/uL (ref 4.0–10.5)

## 2016-12-09 LAB — COMPREHENSIVE METABOLIC PANEL
ALT: 53 U/L (ref 14–54)
ANION GAP: 9 (ref 5–15)
AST: 65 U/L — ABNORMAL HIGH (ref 15–41)
Albumin: 3.6 g/dL (ref 3.5–5.0)
Alkaline Phosphatase: 80 U/L (ref 38–126)
BILIRUBIN TOTAL: 0.5 mg/dL (ref 0.3–1.2)
BUN: 44 mg/dL — AB (ref 6–20)
CALCIUM: 9 mg/dL (ref 8.9–10.3)
CO2: 26 mmol/L (ref 22–32)
Chloride: 101 mmol/L (ref 101–111)
Creatinine, Ser: 1.94 mg/dL — ABNORMAL HIGH (ref 0.44–1.00)
GFR calc Af Amer: 29 mL/min — ABNORMAL LOW (ref >60)
GFR, EST NON AFRICAN AMERICAN: 25 mL/min — AB (ref >60)
Glucose, Bld: 118 mg/dL — ABNORMAL HIGH (ref 65–99)
POTASSIUM: 4.6 mmol/L (ref 3.5–5.1)
Sodium: 136 mmol/L (ref 135–145)
TOTAL PROTEIN: 7.2 g/dL (ref 6.5–8.1)

## 2016-12-09 LAB — URINALYSIS, ROUTINE W REFLEX MICROSCOPIC
Bilirubin Urine: NEGATIVE
GLUCOSE, UA: NEGATIVE mg/dL
Hgb urine dipstick: NEGATIVE
KETONES UR: NEGATIVE mg/dL
Nitrite: POSITIVE — AB
PH: 7.5 (ref 5.0–8.0)
Protein, ur: 100 mg/dL — AB
SPECIFIC GRAVITY, URINE: 1.01 (ref 1.005–1.030)

## 2016-12-09 LAB — I-STAT ARTERIAL BLOOD GAS, ED
Acid-Base Excess: 8 mmol/L — ABNORMAL HIGH (ref 0.0–2.0)
Bicarbonate: 29.5 mmol/L — ABNORMAL HIGH (ref 20.0–28.0)
O2 Saturation: 99 %
PH ART: 7.614 — AB (ref 7.350–7.450)
TCO2: 30 mmol/L (ref 22–32)
pCO2 arterial: 29.1 mmHg — ABNORMAL LOW (ref 32.0–48.0)
pO2, Arterial: 120 mmHg — ABNORMAL HIGH (ref 83.0–108.0)

## 2016-12-09 LAB — URINALYSIS, MICROSCOPIC (REFLEX)

## 2016-12-09 LAB — I-STAT TROPONIN, ED: Troponin i, poc: 0.02 ng/mL (ref 0.00–0.08)

## 2016-12-09 LAB — PROTIME-INR
INR: 1.2
PROTHROMBIN TIME: 15.1 s (ref 11.4–15.2)

## 2016-12-09 LAB — BRAIN NATRIURETIC PEPTIDE: B Natriuretic Peptide: 636.4 pg/mL — ABNORMAL HIGH (ref 0.0–100.0)

## 2016-12-09 MED ORDER — DEXTROSE 5 % IV SOLN
1.0000 g | INTRAVENOUS | Status: DC
  Administered 2016-12-10 – 2016-12-11 (×2): 1 g via INTRAVENOUS
  Filled 2016-12-09 (×3): qty 10

## 2016-12-09 MED ORDER — AMIODARONE HCL 200 MG PO TABS
200.0000 mg | ORAL_TABLET | Freq: Every day | ORAL | Status: DC
  Administered 2016-12-10 – 2016-12-12 (×3): 200 mg via ORAL
  Filled 2016-12-09 (×3): qty 1

## 2016-12-09 MED ORDER — IPRATROPIUM-ALBUTEROL 0.5-2.5 (3) MG/3ML IN SOLN
3.0000 mL | RESPIRATORY_TRACT | Status: DC
  Administered 2016-12-10 (×5): 3 mL via RESPIRATORY_TRACT
  Filled 2016-12-09 (×5): qty 3

## 2016-12-09 MED ORDER — ACETAMINOPHEN 325 MG PO TABS
650.0000 mg | ORAL_TABLET | Freq: Once | ORAL | Status: AC
  Administered 2016-12-09: 650 mg via ORAL
  Filled 2016-12-09: qty 2

## 2016-12-09 MED ORDER — VITAMIN D 1000 UNITS PO TABS
5000.0000 [IU] | ORAL_TABLET | Freq: Every day | ORAL | Status: DC
  Administered 2016-12-10 – 2016-12-12 (×3): 5000 [IU] via ORAL
  Filled 2016-12-09 (×3): qty 5

## 2016-12-09 MED ORDER — RIVAROXABAN 15 MG PO TABS
15.0000 mg | ORAL_TABLET | Freq: Every day | ORAL | Status: DC
  Administered 2016-12-10 – 2016-12-11 (×3): 15 mg via ORAL
  Filled 2016-12-09 (×3): qty 1

## 2016-12-09 MED ORDER — GABAPENTIN 300 MG PO CAPS
300.0000 mg | ORAL_CAPSULE | Freq: Every day | ORAL | Status: DC
  Administered 2016-12-10 – 2016-12-11 (×3): 300 mg via ORAL
  Filled 2016-12-09 (×3): qty 1

## 2016-12-09 MED ORDER — AMLODIPINE BESYLATE 10 MG PO TABS
10.0000 mg | ORAL_TABLET | Freq: Every day | ORAL | Status: DC
  Administered 2016-12-10 – 2016-12-12 (×3): 10 mg via ORAL
  Filled 2016-12-09 (×3): qty 1

## 2016-12-09 MED ORDER — FUROSEMIDE 10 MG/ML IJ SOLN
80.0000 mg | Freq: Once | INTRAMUSCULAR | Status: AC
  Administered 2016-12-09: 80 mg via INTRAVENOUS
  Filled 2016-12-09: qty 8

## 2016-12-09 MED ORDER — SODIUM CHLORIDE 0.9 % IV BOLUS (SEPSIS)
1000.0000 mL | Freq: Once | INTRAVENOUS | Status: AC
  Administered 2016-12-09: 1000 mL via INTRAVENOUS

## 2016-12-09 MED ORDER — ONDANSETRON HCL 4 MG/2ML IJ SOLN: 4.0000 mg | Freq: Three times a day (TID) | INTRAMUSCULAR | Status: DC | PRN

## 2016-12-09 MED ORDER — HYDRALAZINE HCL 50 MG PO TABS
100.0000 mg | ORAL_TABLET | Freq: Three times a day (TID) | ORAL | Status: DC
Start: 2016-12-10 — End: 2016-12-12
  Administered 2016-12-10 – 2016-12-12 (×8): 100 mg via ORAL
  Filled 2016-12-09 (×8): qty 2

## 2016-12-09 MED ORDER — DEXTROSE 5 % IV SOLN
500.0000 mg | INTRAVENOUS | Status: DC
  Administered 2016-12-10: 500 mg via INTRAVENOUS
  Filled 2016-12-09: qty 500

## 2016-12-09 MED ORDER — SODIUM CHLORIDE 0.9 % IV BOLUS (SEPSIS): 1000.0000 mL | Freq: Once | INTRAVENOUS | Status: DC

## 2016-12-09 MED ORDER — FUROSEMIDE 10 MG/ML IJ SOLN: 80.0000 mg | Freq: Two times a day (BID) | INTRAMUSCULAR | Status: DC

## 2016-12-09 MED ORDER — INSULIN ASPART 100 UNIT/ML ~~LOC~~ SOLN
0.0000 [IU] | Freq: Three times a day (TID) | SUBCUTANEOUS | Status: DC
  Administered 2016-12-10: 3 [IU] via SUBCUTANEOUS
  Administered 2016-12-10 (×2): 7 [IU] via SUBCUTANEOUS
  Administered 2016-12-11 (×2): 5 [IU] via SUBCUTANEOUS
  Administered 2016-12-11 – 2016-12-12 (×2): 7 [IU] via SUBCUTANEOUS
  Administered 2016-12-12: 5 [IU] via SUBCUTANEOUS

## 2016-12-09 MED ORDER — METHYLPREDNISOLONE SODIUM SUCC 125 MG IJ SOLR
60.0000 mg | Freq: Two times a day (BID) | INTRAMUSCULAR | Status: DC
  Administered 2016-12-10 – 2016-12-11 (×5): 60 mg via INTRAVENOUS
  Filled 2016-12-09 (×5): qty 2

## 2016-12-09 MED ORDER — ZOLPIDEM TARTRATE 5 MG PO TABS
5.0000 mg | ORAL_TABLET | Freq: Every evening | ORAL | Status: DC | PRN
  Administered 2016-12-10 – 2016-12-11 (×2): 5 mg via ORAL
  Filled 2016-12-09 (×2): qty 1

## 2016-12-09 MED ORDER — IPRATROPIUM-ALBUTEROL 0.5-2.5 (3) MG/3ML IN SOLN
3.0000 mL | Freq: Once | RESPIRATORY_TRACT | Status: AC
  Administered 2016-12-09: 3 mL via RESPIRATORY_TRACT
  Filled 2016-12-09: qty 3

## 2016-12-09 MED ORDER — CARBIDOPA-LEVODOPA 25-100 MG PO TABS
1.0000 | ORAL_TABLET | Freq: Three times a day (TID) | ORAL | Status: DC
  Administered 2016-12-10 – 2016-12-12 (×8): 1 via ORAL
  Filled 2016-12-09 (×8): qty 1

## 2016-12-09 MED ORDER — ROPINIROLE HCL 1 MG PO TABS
4.0000 mg | ORAL_TABLET | Freq: Every day | ORAL | Status: DC
  Administered 2016-12-10 – 2016-12-11 (×3): 4 mg via ORAL
  Filled 2016-12-09 (×4): qty 4

## 2016-12-09 MED ORDER — IRBESARTAN 300 MG PO TABS
300.0000 mg | ORAL_TABLET | Freq: Every day | ORAL | Status: DC
  Administered 2016-12-10 – 2016-12-11 (×2): 300 mg via ORAL
  Filled 2016-12-09 (×2): qty 1

## 2016-12-09 MED ORDER — ALBUTEROL SULFATE (2.5 MG/3ML) 0.083% IN NEBU: 2.5000 mg | INHALATION_SOLUTION | RESPIRATORY_TRACT | Status: DC | PRN

## 2016-12-09 MED ORDER — SODIUM CHLORIDE 0.9 % IV BOLUS (SEPSIS)
1000.0000 mL | Freq: Once | INTRAVENOUS | Status: DC
  Administered 2016-12-09: 1000 mL via INTRAVENOUS

## 2016-12-09 MED ORDER — DEXTROSE 5 % IV SOLN
1.0000 g | Freq: Once | INTRAVENOUS | Status: AC
  Administered 2016-12-09: 1 g via INTRAVENOUS
  Filled 2016-12-09: qty 10

## 2016-12-09 MED ORDER — INSULIN GLARGINE 100 UNIT/ML ~~LOC~~ SOLN
10.0000 [IU] | Freq: Every day | SUBCUTANEOUS | Status: DC
  Administered 2016-12-10 – 2016-12-11 (×3): 10 [IU] via SUBCUTANEOUS
  Filled 2016-12-09 (×3): qty 0.1

## 2016-12-09 MED ORDER — CITALOPRAM HYDROBROMIDE 20 MG PO TABS
20.0000 mg | ORAL_TABLET | Freq: Every day | ORAL | Status: DC
  Administered 2016-12-10 – 2016-12-12 (×3): 20 mg via ORAL
  Filled 2016-12-09 (×3): qty 1

## 2016-12-09 MED ORDER — DM-GUAIFENESIN ER 30-600 MG PO TB12
1.0000 | ORAL_TABLET | Freq: Two times a day (BID) | ORAL | Status: DC
  Administered 2016-12-10 – 2016-12-12 (×6): 1 via ORAL
  Filled 2016-12-09 (×6): qty 1

## 2016-12-09 MED ORDER — ACETAMINOPHEN 325 MG PO TABS: 650.0000 mg | ORAL_TABLET | Freq: Four times a day (QID) | ORAL | Status: DC | PRN

## 2016-12-09 MED ORDER — METOPROLOL SUCCINATE ER 50 MG PO TB24
50.0000 mg | ORAL_TABLET | Freq: Every day | ORAL | Status: DC
  Administered 2016-12-10 – 2016-12-12 (×3): 50 mg via ORAL
  Filled 2016-12-09 (×3): qty 1

## 2016-12-09 MED ORDER — HYDRALAZINE HCL 20 MG/ML IJ SOLN: 5.0000 mg | INTRAMUSCULAR | Status: DC | PRN

## 2016-12-09 MED ORDER — DEXTROSE 5 % IV SOLN
500.0000 mg | Freq: Once | INTRAVENOUS | Status: AC
  Administered 2016-12-09: 500 mg via INTRAVENOUS
  Filled 2016-12-09: qty 500

## 2016-12-09 NOTE — ED Notes (Signed)
Admitting at bedside 

## 2016-12-09 NOTE — ED Provider Notes (Signed)
[facility] DEPT Provider Note   CSN: [id number] Arrival date & time: 12/09/16  2019    History   Chief Complaint Chief Complaint  Patient presents with  . Cough  . Shortness of Breath    HPI Melissa Powers is a 71 y.o. female.  71 year old female with a history of dyslipidemia, hypertension, diabetes, atrial fibrillation (not on chronic anticoagulation), COPD, CKD, and CHF (LVEF 60-65% in 02/2016) presents to the emergency department for generalized weakness. She states that symptoms have been worsening over the last 3 days. Per [facility] EMS, patient from home complaining of shortness of breath and a nonproductive cough as well. She states that her symptoms feel similar to when she was diagnosed with pneumonia. She has no complaints of shortness of breath on my assessment. She denies chest pain. She was found to be hypoxic to 77% on room air on EMS arrival. She was 84% on room air in the ED. Patient placed on supplemental oxygen. She denies chronic use of supplemental O2. EMS report fever of 101F temporally. She does have an oral temperature of 100.60F in the ED. She denies sick contacts as well as dysuria, hematuria, nausea, vomiting. She reports taking all of her daily medications.   The history is provided by the patient and the EMS personnel. No language interpreter was used.  Cough  Associated symptoms include shortness of breath.  Shortness of Breath  Associated symptoms include cough.    Past Medical History:  Diagnosis Date  . Anxiety   . CHF (congestive heart failure) ([place])   . Chronic kidney disease (CKD), stage III (moderate)   . COPD (chronic obstructive pulmonary disease) ([place])   . Diastolic dysfunction with chronic heart failure ([place])   . Hyperlipidemia   . Hypertension   . Parkinson's disease ([place])    with deep brain stimulator  . Persistent atrial fibrillation ([place])   . Pneumonia 08/2015  . Sleep apnea    "tried mask; can't wear it" (10/19/2015)  . Type II  diabetes mellitus [facility])     Patient Active Problem List   Diagnosis Date Noted  . A-fib ([place]) 02/03/2016  . Sepsis, unspecified organism ([place]) 12/20/2015  . Paroxysmal atrial fibrillation ([place]) 12/20/2015  . Atrial fibrillation with RVR ([place]) 12/20/2015  . Chronic respiratory failure ([place]) 11/09/2015  . CKD (chronic kidney disease) stage 4, GFR 15-29 ml/min (HCC) 11/09/2015  . Diabetes mellitus type 2, controlled ([place]) 11/09/2015  . Essential hypertension   . Parkinson's disease [facility])     Past Surgical History:  Procedure Laterality Date  . ABDOMINAL HYSTERECTOMY    . CARDIOVERSION N/A 12/22/2015   Procedure: CARDIOVERSION;  Surgeon: Jolaine Artist, MD;  Location: [facility] ENDOSCOPY;  Service: Cardiovascular;  Laterality: N/A;  . DEEP BRAIN STIMULATOR PLACEMENT  01/2015   for Parikinsons treatment  . ELECTROPHYSIOLOGIC STUDY N/A 02/03/2016   Procedure: Atrial Fibrillation Ablation;  Surgeon: Thompson Grayer, MD;  Location: [facility] CV LAB;  Service: Cardiovascular;  Laterality: N/A;  . TEE WITHOUT CARDIOVERSION N/A 02/03/2016   Procedure: TRANSESOPHAGEAL ECHOCARDIOGRAM (TEE);  Surgeon: Larey Dresser, MD;  Location: [facility];  Service: Cardiovascular;  Laterality: N/A;  . TUBAL LIGATION      OB History    No data available       Home Medications    Prior to Admission medications   Medication Sig Start Date End Date Taking? Authorizing Provider  amiodarone (PACERONE) 200 MG tablet Take 1 tablet (200 mg total) by mouth daily. 08/24/16  Bensimhon, Shaune Pascal, MD  amLODipine (NORVASC) 10 MG tablet Take 1 tablet (10 mg total) by mouth daily. 10/30/16   Bensimhon, Shaune Pascal, MD  benzonatate (TESSALON) 100 MG capsule Take 2 capsules by mouth as needed (Take as directed for cough).  08/18/16   [provider]  carbidopa-levodopa (SINEMET IR) 25-100 MG per tablet Take 1 tablet by mouth 3 (three) times daily.    [provider]  CHERATUSSIN AC 100-10 MG/5ML syrup Take  5-10 mLs by mouth every 6 (six) hours as needed (Take as directed).  08/18/16   [provider]  Cholecalciferol (VITAMIN D-3) 5000 units TABS Take 1 tablet by mouth daily.    [provider]  citalopram (CELEXA) 20 MG tablet Take 20 mg by mouth daily.    [provider]  furosemide (LASIX) 80 MG tablet Take 80 mg by mouth 2 (two) times daily. 11/29/16   [provider]  gabapentin (NEURONTIN) 300 MG capsule Take 300 mg by mouth at bedtime.    [provider]  hydrALAZINE (APRESOLINE) 100 MG tablet Take 1 tablet (100 mg total) by mouth 3 (three) times daily. 07/19/16   Shirley Friar, PA-C  insulin glargine (LANTUS) 100 UNIT/ML injection Inject 15 Units into the skin at bedtime.    [provider]  irbesartan (AVAPRO) 300 MG tablet Take 300 mg by mouth daily.    [provider]  meclizine (ANTIVERT) 12.5 MG tablet Take 1 tablet (12.5 mg total) by mouth 2 (two) times daily as needed for dizziness. 12/23/15   Mikhail, Velta Addison, DO  metolazone (ZAROXOLYN) 5 MG tablet Take 5 mg by mouth as needed (Take as directed).     [provider]  metoprolol succinate (TOPROL-XL) 50 MG 24 hr tablet Take 50 mg by mouth daily. Take with or immediately following a meal.    [provider]  phenol (CHLORASEPTIC) 1.4 % LIQD Use as directed 1 spray in the mouth or throat as needed for throat irritation / pain. 02/04/16   Chanetta Marshall K, NP  potassium chloride SA (K-DUR,KLOR-CON) 20 MEQ tablet Take 1 tablet (20 mEq total) by mouth once a week. ON FRIDAYS. AND AS NEEDED WITH METOLAZONE DOSE 03/22/16   Clegg, Amy D, NP  PROAIR HFA 108 (90 Base) MCG/ACT inhaler Inhale 2 puffs into the lungs 4 (four) times daily as needed for wheezing or shortness of breath.  08/18/16   [provider]  Rivaroxaban (XARELTO) 15 MG TABS tablet Take 15 mg by mouth daily with supper.    [provider]  rOPINIRole (REQUIP) 3 MG tablet Take 3 mg by  mouth at bedtime. 07/03/16   [provider]  traMADol (ULTRAM) 50 MG tablet Take 50 mg by mouth every 6 (six) hours as needed for moderate pain.    [provider]  traZODone (DESYREL) 50 MG tablet Take 50 mg by mouth at bedtime.     [provider]    Family History Family History  Problem Relation Age of Onset  . Dementia Mother   . Heart Problems Father   . Stroke Sister   . Kidney failure Brother        on ESRD    Social History Social History  Substance Use Topics  . Smoking status: Former Smoker    Quit date: 12/31/1983  . Smokeless tobacco: Never Used  . Alcohol use No     Allergies   Morphine and Morphine and related   Review of Systems  Review of Systems  Unable to perform ROS: Acuity of condition  Respiratory: Positive for cough and shortness of breath.      Physical Exam Updated Vital Signs BP (!) 120/52   Pulse 72   Temp (!) 100.9 F (38.3 C) (Oral)   Resp 16   Ht 5\' 3"  (1.6 m)   Wt 99.3 kg (219 lb)   SpO2 96%   BMI 38.79 kg/m   Physical Exam  Constitutional: She is oriented to person, place, and time. She appears well-developed and well-nourished. No distress.  Obese and anxious; nontoxic.  HENT:  Head: Normocephalic and atraumatic.  Eyes: Conjunctivae and EOM are normal. No scleral icterus.  Neck: Normal range of motion.  Cardiovascular: Normal rate, regular rhythm and intact distal pulses.   Pulmonary/Chest: Tachypnea noted. She has decreased breath sounds. She has wheezes (diffuse, expiratory, mild). She has rhonchi (bilateral bases, faint).  Tachypnea with dyspnea. Hypoxia on room air; improved to 97% on 4L [place]. Faint rhonchi in bilateral bases. Decreased breath sounds diffusely. No wheezes.  Musculoskeletal: Normal range of motion.  No significant BLE pitting edema.  Neurological: She is alert and oriented to person, place, and time. She exhibits normal muscle tone. Coordination normal.  GCS 15. Speech is goal  oriented. Answers questions appropriately and follows commands.  Skin: Skin is warm and dry. No rash noted. She is not diaphoretic. No erythema. No pallor.  Psychiatric: She has a normal mood and affect. Her behavior is normal.  Nursing note and vitals reviewed.    ED Treatments / Results  Labs (all labs ordered are listed, but only abnormal results are displayed) Labs Reviewed  COMPREHENSIVE METABOLIC PANEL - Abnormal; Notable for the following:       Result Value   Glucose, Bld 118 (*)    BUN 44 (*)    Creatinine, Ser 1.94 (*)    AST 65 (*)    GFR calc non Af Amer 25 (*)    GFR calc Af Amer 29 (*)    All other components within normal limits  CBC WITH DIFFERENTIAL/PLATELET - Abnormal; Notable for the following:    Hemoglobin 10.6 (*)    HCT 34.8 (*)    All other components within normal limits  URINALYSIS, ROUTINE W REFLEX MICROSCOPIC - Abnormal; Notable for the following:    Protein, ur 100 (*)    Nitrite POSITIVE (*)    Leukocytes, UA LARGE (*)    All other components within normal limits  BRAIN NATRIURETIC PEPTIDE - Abnormal; Notable for the following:    B Natriuretic Peptide 636.4 (*)    All other components within normal limits  URINALYSIS, MICROSCOPIC (REFLEX) - Abnormal; Notable for the following:    Bacteria, UA MANY (*)    Squamous Epithelial / LPF 6-30 (*)    All other components within normal limits  I-STAT ARTERIAL BLOOD GAS, ED - Abnormal; Notable for the following:    pH, Arterial 7.614 (*)    pCO2 arterial 29.1 (*)    pO2, Arterial 120.0 (*)    Bicarbonate 29.5 (*)    Acid-Base Excess 8.0 (*)    All other components within normal limits  I-STAT CHEM 8, ED - Abnormal; Notable for the following:    Chloride 100 (*)    BUN 45 (*)    Creatinine, Ser 2.00 (*)    Glucose, Bld 119 (*)    Calcium, Ion 1.11 (*)    Hemoglobin 11.2 (*)    HCT 33.0 (*)  All other components within normal limits  CULTURE, BLOOD (ROUTINE X 2)  CULTURE, BLOOD (ROUTINE X 2)    URINE CULTURE  RESPIRATORY PANEL BY PCR  PROTIME-INR  I-STAT CG4 LACTIC ACID, ED  I-STAT TROPONIN, ED    EKG  EKG Interpretation  Date/Time:  Saturday December 09 2016 20:39:22 EDT Ventricular Rate:  70 PR Interval:    QRS Duration: 86 QT Interval:  393 QTC Calculation: 424 R Axis:   -62 Text Interpretation:  Poor quality data, interpretation may be affected Sinus or ectopic atrial rhythm Ventricular premature complex Left anterior fascicular block Probable anteroseptal infarct, old Borderline ST depression, diffuse leads Artifact in lead(s) I II III aVR aVL aVF V2 V3 V4 V5 V6 Confirmed by Fredia Sorrow [phone]) on 12/09/2016 9:40:57 PM      ED ECG REPORT   Date: 12/09/2016  Rate: 70  Rhythm: premature ventricular contractions (PVC) and sinus rhythm  QRS Axis: left  Intervals: normal  ST/T Wave abnormalities: nonspecific T wave changes  Conduction Disutrbances:left anterior fascicular block  Narrative Interpretation: Sinus rhythm with new T wave inversion in aVL; otherwise unchanged from prior.  Old EKG Reviewed: changes noted  I have personally reviewed the EKG tracing and agree with the computerized printout as noted.   Radiology Dg Chest Portable 1 View  Result Date: 12/09/2016 CLINICAL DATA:  Shortness of breath and nonproductive cough 3 days. EXAM: PORTABLE CHEST 1 VIEW COMPARISON:  58/11/9831 FINDINGS: Metallic device over the left chest with lead extending into the left neck unchanged. Lungs are somewhat hypoinflated with moderate bilateral perihilar opacification likely interstitial edema. Suggestion of a small left effusion. Mild cardiomegaly. Remainder the exam is unchanged. IMPRESSION: Mild cardiomegaly with findings suggesting mild interstitial edema and small left effusion. Electronically Signed   By: Marin Olp M.D.   On: 12/09/2016 21:37    Procedures Procedures (including critical care time)  Medications Ordered in ED Medications  sodium chloride  0.9 % bolus 1,000 mL (1,000 mLs Intravenous New Bag/Given 12/09/16 2114)    And  sodium chloride 0.9 % bolus 1,000 mL (not administered)    And  sodium chloride 0.9 % bolus 1,000 mL (not administered)  azithromycin (ZITHROMAX) 500 mg in dextrose 5 % 250 mL IVPB (0 mg Intravenous Stopped 12/09/16 2215)  azithromycin (ZITHROMAX) 500 mg in dextrose 5 % 250 mL IVPB (not administered)  cefTRIAXone (ROCEPHIN) 1 g in dextrose 5 % 50 mL IVPB (not administered)  furosemide (LASIX) injection 80 mg (not administered)  acetaminophen (TYLENOL) tablet 650 mg (650 mg Oral Given 12/09/16 2035)  cefTRIAXone (ROCEPHIN) 1 g in dextrose 5 % 50 mL IVPB (0 g Intravenous Stopped 12/09/16 2156)  ipratropium-albuterol (DUONEB) 0.5-2.5 (3) MG/3ML nebulizer solution 3 mL (3 mLs Nebulization Given 12/09/16 2119)    CRITICAL CARE Performed by: Antonietta Breach   Total critical care time: 35 minutes  Critical care time was exclusive of separately billable procedures and treating other patients.  Critical care was necessary to treat or prevent imminent or life-threatening deterioration.  Critical care was time spent personally by me on the following activities: development of treatment plan with patient and/or surrogate as well as nursing, discussions with consultants, evaluation of patient's response to treatment, examination of patient, obtaining history from patient or surrogate, ordering and performing treatments and interventions, ordering and review of laboratory studies, ordering and review of radiographic studies, pulse oximetry and re-evaluation of patient's condition.   Initial Impression / Assessment and Plan / ED Course  I have  reviewed the triage vital signs and the nursing notes.  Pertinent labs & imaging results that were available during my care of the patient were reviewed by me and considered in my medical decision making (see chart for details).    8:30 PM 71 y/o female presents from home for fever,  SOB, hypoxia, and generalized weakness. EMS noting cough, nonproductive. Symptoms worsening x 3 days. Patient febrile in the ED as well as hypoxic on room air to 84%; previously mid 70's with EMS. No use of chronic home O2 per patient. Sats improving on 4L . Patient meets SIRS criteria with concern for sepsis 2/2 respiratory illness. CXR pending. ?PNA vs viral etiology. Will add RVP. Patient covered with Rocephin and Azithromycin pending Xray. IVF ordered per Sepsis order set.  9:00 PM Patient reassessed. Wheezing present. Will order DuoNeb.  9:30 PM ABG reviewed; suspect these results to be from an improper sample as I was present while sample was obtained and this was suboptimal. No good arterial return during access, only a few drops of blood obtained.  9:50 PM CXR reviewed. There is mild cardiomegaly with findings suggestive of interstitial edema and small pleural effusion. No focal consolidation. Higher suspicion for viral infection. No leukocytosis or elevated lactate today. Possible coexisting mild CHF exacerbation. Will add IV dose of home Lasix. Anticipate admission for management given new supplemental O2 requirement.  10:35 PM Case discussed with Dr. Blaine Hamper of [facility] hospitalist. He will see the patient for admission. Urinalysis which was pending has now resulted. It does show urinary tract infection which may be the cause of patient's fever with associated COPD or CHF exacerbation. Patient has already been covered with IV Rocephin as this was administered on arrival for pneumonia coverage.   Final Clinical Impressions(s) / ED Diagnoses   Final diagnoses:  Sepsis, due to unspecified organism [facility])  Acute respiratory failure with hypoxia ([place])  AKI (acute kidney injury) ([place])  Acute lower UTI    New Prescriptions New Prescriptions   No medications on file     Antonietta Breach, PA-C 12/09/16 2235    Antonietta Breach, PA-C 12/09/16 2236    Fredia Sorrow, MD 12/13/16 [phone]

## 2016-12-09 NOTE — ED Provider Notes (Signed)
Medical screening examination/treatment/procedure(s) were conducted as a shared visit with non-physician practitioner(s) and myself.  I personally evaluated the patient during the encounter.   EKG Interpretation  Date/Time:  Saturday December 09 2016 20:39:22 EDT Ventricular Rate:  70 PR Interval:    QRS Duration: 86 QT Interval:  393 QTC Calculation: 424 R Axis:   -62 Text Interpretation:  Poor quality data, interpretation may be affected Sinus or ectopic atrial rhythm Ventricular premature complex Left anterior fascicular block Probable anteroseptal infarct, old Borderline ST depression, diffuse leads Artifact in lead(s) I II III aVR aVL aVF V2 V3 V4 V5 V6 Confirmed by Fredia Sorrow [phone]) on 12/09/2016 9:40:57 PM       Results for orders placed or performed during the hospital encounter of 12/09/16  Comprehensive metabolic panel  Result Value Ref Range   Sodium 136 135 - 145 mmol/L   Potassium 4.6 3.5 - 5.1 mmol/L   Chloride 101 101 - 111 mmol/L   CO2 26 22 - 32 mmol/L   Glucose, Bld 118 (H) 65 - 99 mg/dL   BUN 44 (H) 6 - 20 mg/dL   Creatinine, Ser 1.94 (H) 0.44 - 1.00 mg/dL   Calcium 9.0 8.9 - 10.3 mg/dL   Total Protein 7.2 6.5 - 8.1 g/dL   Albumin 3.6 3.5 - 5.0 g/dL   AST 65 (H) 15 - 41 U/L   ALT 53 14 - 54 U/L   Alkaline Phosphatase 80 38 - 126 U/L   Total Bilirubin 0.5 0.3 - 1.2 mg/dL   GFR calc non Af Amer 25 (L) >60 mL/min   GFR calc Af Amer 29 (L) >60 mL/min   Anion gap 9 5 - 15  CBC with Differential  Result Value Ref Range   WBC 8.5 4.0 - 10.5 K/uL   RBC 4.01 3.87 - 5.11 MIL/uL   Hemoglobin 10.6 (L) 12.0 - 15.0 g/dL   HCT 34.8 (L) 36.0 - 46.0 %   MCV 86.8 78.0 - 100.0 fL   MCH 26.4 26.0 - 34.0 pg   MCHC 30.5 30.0 - 36.0 g/dL   RDW 14.4 11.5 - 15.5 %   Platelets 202 150 - 400 K/uL   Neutrophils Relative % 78 %   Neutro Abs 6.7 1.7 - 7.7 K/uL   Lymphocytes Relative 14 %   Lymphs Abs 1.2 0.7 - 4.0 K/uL   Monocytes Relative 7 %   Monocytes Absolute 0.6  0.1 - 1.0 K/uL   Eosinophils Relative 1 %   Eosinophils Absolute 0.1 0.0 - 0.7 K/uL   Basophils Relative 0 %   Basophils Absolute 0.0 0.0 - 0.1 K/uL  Protime-INR  Result Value Ref Range   Prothrombin Time 15.1 11.4 - 15.2 seconds   INR 1.20   Brain natriuretic peptide  Result Value Ref Range   B Natriuretic Peptide 636.4 (H) 0.0 - 100.0 pg/mL  I-Stat CG4 Lactic Acid, ED  Result Value Ref Range   Lactic Acid, Venous 0.97 0.5 - 1.9 mmol/L  I-stat troponin, ED (not at [facility])  Result Value Ref Range   Troponin i, poc 0.02 0.00 - 0.08 ng/mL   Comment 3          I-Stat arterial blood gas, ED ([facility], [facility])  Result Value Ref Range   pH, Arterial 7.614 (HH) 7.350 - 7.450   pCO2 arterial 29.1 (L) 32.0 - 48.0 mmHg   pO2, Arterial 120.0 (H) 83.0 - 108.0 mmHg   Bicarbonate 29.5 (H) 20.0 - 28.0 mmol/L  TCO2 30 22 - 32 mmol/L   O2 Saturation 99.0 %   Acid-Base Excess 8.0 (H) 0.0 - 2.0 mmol/L   Patient temperature 98.7 F    Collection site RADIAL, ALLEN'S TEST ACCEPTABLE    Drawn by Operator    Sample type ARTERIAL    Comment NOTIFIED PHYSICIAN   I-stat Chem 8, ED  Result Value Ref Range   Sodium 138 135 - 145 mmol/L   Potassium 4.6 3.5 - 5.1 mmol/L   Chloride 100 (L) 101 - 111 mmol/L   BUN 45 (H) 6 - 20 mg/dL   Creatinine, Ser 2.00 (H) 0.44 - 1.00 mg/dL   Glucose, Bld 119 (H) 65 - 99 mg/dL   Calcium, Ion 1.11 (L) 1.15 - 1.40 mmol/L   TCO2 29 22 - 32 mmol/L   Hemoglobin 11.2 (L) 12.0 - 15.0 g/dL   HCT 33.0 (L) 36.0 - 46.0 %   Dg Chest Portable 1 View  Result Date: 12/09/2016 CLINICAL DATA:  Shortness of breath and nonproductive cough 3 days. EXAM: PORTABLE CHEST 1 VIEW COMPARISON:  31/54/0086 FINDINGS: Metallic device over the left chest with lead extending into the left neck unchanged. Lungs are somewhat hypoinflated with moderate bilateral perihilar opacification likely interstitial edema. Suggestion of a small left effusion. Mild cardiomegaly. Remainder the exam is unchanged.  IMPRESSION: Mild cardiomegaly with findings suggesting mild interstitial edema and small left effusion. Electronically Signed   By: Marin Olp M.D.   On: 12/09/2016 21:37    Patient seen by me along with the physician assistant. Patient brought in by [facility] with the complaint of shortness of breath and productive cough for 3 days. Patient room air sats are 77% at home. Started on a 10 L nonrebreather brought sats up to 98%. When she was taken off oxygen here sats went down to 84%. Patient had a temperature of 101. Patient with coughing congestion for the past 3 days is noted. Patient has a history of CHF. Has had some leg swelling.  Patient clinically was suspicious for possible open respiratory infection or pneumonia. Patient met sepsis criteria. Sepsis orders were placed. Patient treated with broad-spectrum antibiotics. Patient blood pressure was never hypotensive. Chest x-ray negative for pneumonia. Urinalysis positive for urinary tract infection. Patient also will be given some Lasix due to her history of CHF. The chest x-ray certainly had no evidence of marked pulmonary edema.  Patient is alert will answer questions. Lungs had bilateral faint wheezing. Right greater than left. Patient had some leg edema.   Lab-wise patient's lactic acid was not elevated. Troponin was normal. Patient had a blood gas done but she had a pH of 7.6 sure if it's accurate. However PO2 and PCO2 showed no evidence of significant respiratory failure. No leukocytosis.   Critical care time documented by physician assistant Hospitalist will admit.   Fredia Sorrow, MD 12/09/16 2236

## 2016-12-09 NOTE — ED Triage Notes (Signed)
Per [facility] pt from home with complaint of shob and non productive cough x 3 days. RA sat was 77% on scene, pt placed on NRB at 10L and spo2 at 98%. 84% RA sat here. Pt able to speak in 3-4 word sentences. 101.0 temporal temp for EMS, BP 220/120, HR 106, RR 24

## 2016-12-09 NOTE — H&P (Signed)
History and Physical    Melissa Powers [id number]:[id number] DOB: 01-07-1946 DOA: 12/09/2016  Referring MD/NP/PA:   PCP: Marco Collie, MD   Patient coming from:  The patient is coming from home.  At baseline, pt is independent for most of ADL.  Chief Complaint: Shortness of breath, cough, fever  HPI: Melissa Powers is a 70 y.o. female with medical history significant of hypertension, hyperlipidemia, diabetes mellitus, COPD, depression, atrial fibrillation on Xarelto, Parkinson's disease, dCHF, OSA not on CPAP, CKD-4, who presents with shortness breath, cough and fever.  Patient states that she has been having SOB and dry cough in the past 3 days, which has been progressively getting worse. NO chest pain, no tenderness in the calf areas. Patient also has fever and chills. Pt has wheezing. Patient was found to have oxygen desaturation to 77% at arrival. Patient denies nausea, vomiting, diarrhea, abdominal pain, symptoms of UTI. No unilateral weakness.  ED Course: pt was found to have BNP 636, negative troponin, WBC 8.5, lactic acid 0.97, INR 1.20, stable renal function, temperature 129, no tachycardia, has tachypnea, chest x-ray showed interstitial edema and small left pleural effusion. Urinalysis positive, but with squamous cell contamination. Patient is admitted to telemetry bed as inpatient.  Review of Systems:   General: has fevers, chills, no body weight gain, has poor appetite, has fatigue HEENT: no blurry vision, hearing changes or sore throat Respiratory: has dyspnea, coughing, wheezing CV: no chest pain, no palpitations GI: no nausea, vomiting, abdominal pain, diarrhea, constipation GU: no dysuria, burning on urination, increased urinary frequency, hematuria  Ext: has leg edema Neuro: no unilateral weakness, numbness, or tingling, no vision change or hearing loss Skin: no rash, no skin tear. MSK: No muscle spasm, no deformity, no limitation of range of movement in spin Heme: No easy  bruising.  Travel history: No recent long distant travel.  Allergy:  Allergies  Allergen Reactions  . Morphine     Mental Status Changes (intolerance)  . Morphine And Related Other (See Comments)    Pt reports intolerance due to sedation from morphine    Past Medical History:  Diagnosis Date  . Anxiety   . CHF (congestive heart failure) ([place])   . Chronic kidney disease (CKD), stage III (moderate)   . COPD (chronic obstructive pulmonary disease) ([place])   . Diastolic dysfunction with chronic heart failure ([place])   . Hyperlipidemia   . Hypertension   . Parkinson's disease ([place])    with deep brain stimulator  . Persistent atrial fibrillation ([place])   . Pneumonia 08/2015  . Sleep apnea    "tried mask; can't wear it" (10/19/2015)  . Type II diabetes mellitus ([place])     Past Surgical History:  Procedure Laterality Date  . ABDOMINAL HYSTERECTOMY    . CARDIOVERSION N/A 12/22/2015   Procedure: CARDIOVERSION;  Surgeon: Jolaine Artist, MD;  Location: [facility] ENDOSCOPY;  Service: Cardiovascular;  Laterality: N/A;  . DEEP BRAIN STIMULATOR PLACEMENT  01/2015   for Parikinsons treatment  . ELECTROPHYSIOLOGIC STUDY N/A 02/03/2016   Procedure: Atrial Fibrillation Ablation;  Surgeon: Thompson Grayer, MD;  Location: [facility] CV LAB;  Service: Cardiovascular;  Laterality: N/A;  . TEE WITHOUT CARDIOVERSION N/A 02/03/2016   Procedure: TRANSESOPHAGEAL ECHOCARDIOGRAM (TEE);  Surgeon: Larey Dresser, MD;  Location: [facility];  Service: Cardiovascular;  Laterality: N/A;  . TUBAL LIGATION      Social History:  reports that she quit smoking about 32 years ago. She has never used smokeless tobacco. She reports  that she does not drink alcohol or use drugs.  Family History:  Family History  Problem Relation Age of Onset  . Dementia Mother   . Heart Problems Father   . Stroke Sister   . Kidney failure Brother        on ESRD     Prior to Admission medications   Medication Sig Start Date End Date  Taking? Authorizing Provider  amiodarone (PACERONE) 200 MG tablet Take 1 tablet (200 mg total) by mouth daily. 08/24/16  Yes Bensimhon, Shaune Pascal, MD  amLODipine (NORVASC) 10 MG tablet Take 1 tablet (10 mg total) by mouth daily. 10/30/16  Yes Bensimhon, Shaune Pascal, MD  carbidopa-levodopa (SINEMET IR) 25-100 MG per tablet Take 1 tablet by mouth 3 (three) times daily.   Yes [provider]  Cholecalciferol (VITAMIN D-3) 5000 units TABS Take 1 tablet by mouth daily.   Yes [provider]  citalopram (CELEXA) 20 MG tablet Take 20 mg by mouth daily.   Yes [provider]  gabapentin (NEURONTIN) 300 MG capsule Take 300 mg by mouth at bedtime.   Yes [provider]  hydrALAZINE (APRESOLINE) 100 MG tablet Take 1 tablet (100 mg total) by mouth 3 (three) times daily. 07/19/16  Yes Shirley Friar, PA-C  irbesartan (AVAPRO) 300 MG tablet Take 300 mg by mouth daily.   Yes [provider]  metoprolol succinate (TOPROL-XL) 50 MG 24 hr tablet Take 50 mg by mouth daily. Take with or immediately following a meal.   Yes [provider]  potassium chloride SA (K-DUR,KLOR-CON) 20 MEQ tablet Take 1 tablet (20 mEq total) by mouth once a week. ON FRIDAYS. AND AS NEEDED WITH METOLAZONE DOSE Patient taking differently: Take 20 mEq by mouth 2 (two) times a week. On Monday and Friday 03/22/16  Yes Clegg, Amy D, NP  Rivaroxaban (XARELTO) 15 MG TABS tablet Take 15 mg by mouth daily with supper.   Yes [provider]  rOPINIRole (REQUIP) 4 MG tablet Take 4 mg by mouth at bedtime.   Yes [provider]  torsemide (DEMADEX) 20 MG tablet Take 20 mg by mouth 2 (two) times a week. On Monday and Friday   Yes [provider]  benzonatate (TESSALON) 100 MG capsule Take 2 capsules by mouth as needed (Take as directed for cough).  08/18/16   [provider]  CHERATUSSIN AC 100-10 MG/5ML syrup Take 5-10 mLs by mouth every 6 (six) hours as needed (Take as  directed).  08/18/16   [provider]  furosemide (LASIX) 80 MG tablet Take 80 mg by mouth 2 (two) times daily. 11/29/16   [provider]  insulin glargine (LANTUS) 100 UNIT/ML injection Inject 15 Units into the skin at bedtime.    [provider]  meclizine (ANTIVERT) 12.5 MG tablet Take 1 tablet (12.5 mg total) by mouth 2 (two) times daily as needed for dizziness. 12/23/15   Mikhail, Velta Addison, DO  phenol (CHLORASEPTIC) 1.4 % LIQD Use as directed 1 spray in the mouth or throat as needed for throat irritation / pain. 02/04/16   Patsey Berthold, NP  PROAIR HFA 108 (90 Base) MCG/ACT inhaler Inhale 2 puffs into the lungs 4 (four) times daily as needed for wheezing or shortness of breath.  08/18/16   [provider]    Physical Exam: Vitals:   12/09/16 2115 12/09/16 2145 12/09/16 2232 12/10/16 0041  BP: (!) 140/54 (!) 120/52 (!) 105/45 (!) 111/50  Pulse: 65 72 65 64  Resp: 19 16 20 20   Temp:    99 F (37.2 C)  TempSrc:    Oral  SpO2: 96% 96% 95% 96%  Weight:    98.9 kg (218 lb)  Height:    5\' 3"  (1.6 m)   General: Not in acute distress HEENT:       Eyes: PERRL, EOMI, no scleral icterus.       ENT: No discharge from the ears and nose, no pharynx injection, no tonsillar enlargement.        Neck: No JVD, no bruit, no mass felt. Heme: No neck lymph node enlargement. Cardiac: S1/S2, RRR, No murmurs, No gallops or rubs. Respiratory: Has wheezing bilaterally. GI: Soft, nondistended, nontender, no rebound pain, no organomegaly, BS present. GU: No hematuria Ext: has 1+ pitting leg edema bilaterally. 2+DP/PT pulse bilaterally. Musculoskeletal: No joint deformities, No joint redness or warmth, no limitation of ROM in spin. Skin: No rashes.  Neuro: Alert, oriented X3, cranial nerves II-XII grossly intact, moves all extremities normally. Psych: Patient is not psychotic, no suicidal or hemocidal ideation.  Labs on Admission: I have personally reviewed following labs  and imaging studies  CBC:  Recent Labs Lab 12/09/16 2035 12/09/16 2059  WBC 8.5  --   NEUTROABS 6.7  --   HGB 10.6* 11.2*  HCT 34.8* 33.0*  MCV 86.8  --   PLT 202  --    Basic Metabolic Panel:  Recent Labs Lab 12/09/16 2035 12/09/16 2059  NA 136 138  K 4.6 4.6  CL 101 100*  CO2 26  --   GLUCOSE 118* 119*  BUN 44* 45*  CREATININE 1.94* 2.00*  CALCIUM 9.0  --    GFR: Estimated Creatinine Clearance: 28.9 mL/min (A) (by C-G formula based on SCr of 2 mg/dL (H)). Liver Function Tests:  Recent Labs Lab 12/09/16 2035  AST 65*  ALT 53  ALKPHOS 80  BILITOT 0.5  PROT 7.2  ALBUMIN 3.6   No results for input(s): LIPASE, AMYLASE in the last 168 hours. No results for input(s): AMMONIA in the last 168 hours. Coagulation Profile:  Recent Labs Lab 12/09/16 2035  INR 1.20   Cardiac Enzymes: No results for input(s): CKTOTAL, CKMB, CKMBINDEX, TROPONINI in the last 168 hours. BNP (last 3 results) No results for input(s): PROBNP in the last 8760 hours. HbA1C: No results for input(s): HGBA1C in the last 72 hours. CBG: No results for input(s): GLUCAP in the last 168 hours. Lipid Profile: No results for input(s): CHOL, HDL, LDLCALC, TRIG, CHOLHDL, LDLDIRECT in the last 72 hours. Thyroid Function Tests: No results for input(s): TSH, T4TOTAL, FREET4, T3FREE, THYROIDAB in the last 72 hours. Anemia Panel: No results for input(s): VITAMINB12, FOLATE, FERRITIN, TIBC, IRON, RETICCTPCT in the last 72 hours. Urine analysis:    Component Value Date/Time   COLORURINE YELLOW 12/09/2016 2150   APPEARANCEUR CLEAR 12/09/2016 2150   LABSPEC 1.010 12/09/2016 2150   PHURINE 7.5 12/09/2016 2150   GLUCOSEU NEGATIVE 12/09/2016 2150   HGBUR NEGATIVE 12/09/2016 2150   BILIRUBINUR NEGATIVE 12/09/2016 2150   KETONESUR NEGATIVE 12/09/2016 2150   PROTEINUR 100 (A) 12/09/2016 2150   NITRITE POSITIVE (A) 12/09/2016 2150   LEUKOCYTESUR LARGE (A) 12/09/2016 2150   Sepsis  Labs: @LABRCNTIP (procalcitonin:4,lacticidven:4) )No results found for this or any previous visit (from the past 240 hour(s)).   Radiological Exams on Admission: Dg Chest Portable 1 View  Result Date: 12/09/2016 CLINICAL DATA:  Shortness of breath and nonproductive cough 3 days. EXAM: PORTABLE CHEST 1  VIEW COMPARISON:  54/65/0354 FINDINGS: Metallic device over the left chest with lead extending into the left neck unchanged. Lungs are somewhat hypoinflated with moderate bilateral perihilar opacification likely interstitial edema. Suggestion of a small left effusion. Mild cardiomegaly. Remainder the exam is unchanged. IMPRESSION: Mild cardiomegaly with findings suggesting mild interstitial edema and small left effusion. Electronically Signed   By: Marin Olp M.D.   On: 12/09/2016 21:37     EKG: Independently reviewed.  Sinus rhythm, QTC 424, LAD, PVC, anteroseptal infarction pattern.   Assessment/Plan Principal Problem:   Acute on chronic respiratory failure with hypoxia (HCC) Active Problems:   Essential hypertension   Parkinson's disease (HCC)   CKD (chronic kidney disease) stage 4, GFR 15-29 ml/min (HCC)   Type II diabetes mellitus with renal manifestations (HCC)   Sepsis, unspecified organism (HCC)   A-fib (HCC)   Depression   Acute on chronic diastolic CHF (congestive heart failure) (HCC)   COPD with acute exacerbation (HCC)   Acute on chronic respiratory failure with hypoxia due to combination of COPD exacerbation and acute on chronic diastolic CHF: Patient has elevated BNP, 1+ leg edema and interstitial pulmonary edema on chest x-ray, consistent with CHF exacerbation. Patient has wheezing on auscultation, consistent with COPD exacerbation. -will admit to tele bed as inpt -will treat CHF and COPD exacerbation as below  Acute on chronic diastolic CHF: 2-D echo 08/23/66 showed EF of 55-60%. -Lasix 80 mg bid by IV -2d echo -will continue home metoprolol -Daily weights -strict  I/O's  COPD exacerbation and sepsis: Patient meets criteria for sepsis with fever and tachypnea. Lactic acid is normal. No leukocytosis. Hemodynamically stable. -Nebulizers: scheduled Duoneb and prn albuterol -Solu-Medrol 60 mg IV bid - Rocephin and azithromycin were started in ED, will continue. -Mucinex for cough  -Incentive spirometry -Urine S. pneumococcal antigen -Follow up blood culture x2, sputum culture, respiratory virus panel -will get Procalcitonin and trend lactic acid levels per sepsis protocol. -IVF: received 1L of NS in ED. Will not continue IVF due to CHF exacerbation  Essential hypertension: -Continue amlodipine, losartan, metoprolol, hydralazine -IVF and shortness and when necessary  Parkinson's disease ([place]): -continue Sinemet  CKD-IV: Baseline creatinine 1.7-2.0. Her creatinine is 1.94, BUN 44. Renal function. -Follow-uprenal function by BMP  Type II diabetes mellitus with renal manifestations ([place]): Last A1c 6.3 on 10/22/15, well controled. Patient is taking Lantus at home -will decrease Lantus dose from 15-10 units daily -SSI  Atrial Fibrillation: CHA2DS2-VASc Score is 5, needs oral anticoagulation. Patient is on Xarelto at home. Heart rate is well controlled. -Continue amiodarone, metoprolol -Xarelto   Depression: Stable, no suicidal or homicidal ideations. -Continue home medications: Celexa, Requip   DVT ppx: Xarelto Code Status: Full code Family Communication:   Yes, patient's husband and son at bed side Disposition Plan:  Anticipate discharge back to previous home environment Consults called:  none Admission status:  Inpatient/tele       Date of Service 12/10/2016    Ivor Costa [facility] Pager [phone]  If 7PM-7AM, please contact night-coverage [email or web address] Password [facility] 12/10/2016, 12:52 AM

## 2016-12-09 NOTE — ED Notes (Signed)
This RN attempted IV x 1.  [place], RN attempted second IV x 1.  Lennette Bihari, RN at bedside at this time

## 2016-12-10 ENCOUNTER — Inpatient Hospital Stay (HOSPITAL_COMMUNITY): Payer: Medicare (Managed Care)

## 2016-12-10 DIAGNOSIS — I340 Nonrheumatic mitral (valve) insufficiency: Secondary | ICD-10-CM

## 2016-12-10 DIAGNOSIS — J441 Chronic obstructive pulmonary disease with (acute) exacerbation: Secondary | ICD-10-CM | POA: Diagnosis present

## 2016-12-10 LAB — ECHOCARDIOGRAM COMPLETE
AVLVOTPG: 7 mmHg
Area-P 1/2: 3.55 cm2
CHL CUP DOP CALC LVOT VTI: 27.1 cm
CHL CUP TV REG PEAK VELOCITY: 342 cm/s
E decel time: 268 msec
E/e' ratio: 14.46
Height: 63 in
LA vol A4C: 41 ml
LV E/e' medial: 14.46
LV E/e'average: 14.46
LVDIAVOL: 108 mL — AB (ref 46–106)
LVDIAVOLIN: 54 mL/m2
LVELAT: 10.1 cm/s
LVOTPV: 131 cm/s
LVSYSVOL: 34 mL (ref 14–42)
LVSYSVOLIN: 17 mL/m2
MV Annulus VTI: 56.8 cm
MV Dec: 268
MV M vel: 88.5
MV Peak grad: 9 mmHg
MV pk E vel: 146 m/s
MVG: 4 mmHg
MVPKAVEL: 84.9 m/s
P 1/2 time: 73 ms
RV LATERAL S' VELOCITY: 13.8 cm/s
RV TAPSE: 27.2 mm
Simpson's disk: 69
Stroke v: 74 ml
TDI e' lateral: 10.1
TDI e' medial: 7.88
TR max vel: 342 cm/s
Weight: 3488 oz

## 2016-12-10 LAB — GLUCOSE, CAPILLARY
GLUCOSE-CAPILLARY: 229 mg/dL — AB (ref 65–99)
GLUCOSE-CAPILLARY: 314 mg/dL — AB (ref 65–99)
GLUCOSE-CAPILLARY: 274 mg/dL — AB (ref 65–99)
Glucose-Capillary: 137 mg/dL — ABNORMAL HIGH (ref 65–99)
Glucose-Capillary: 355 mg/dL — ABNORMAL HIGH (ref 65–99)

## 2016-12-10 LAB — RESPIRATORY PANEL BY PCR

## 2016-12-10 LAB — LACTIC ACID, PLASMA: LACTIC ACID, VENOUS: 1.1 mmol/L (ref 0.5–1.9)

## 2016-12-10 LAB — PROCALCITONIN: Procalcitonin: <0.1 ng/mL

## 2016-12-10 LAB — STREP PNEUMONIAE URINARY ANTIGEN: Strep Pneumo Urinary Antigen: NEGATIVE

## 2016-12-10 LAB — MRSA PCR SCREENING: MRSA by PCR: NEGATIVE

## 2016-12-10 MED ORDER — FUROSEMIDE 10 MG/ML IJ SOLN
40.0000 mg | Freq: Every day | INTRAMUSCULAR | Status: DC
  Administered 2016-12-10 – 2016-12-11 (×2): 40 mg via INTRAVENOUS
  Filled 2016-12-10 (×2): qty 4

## 2016-12-10 MED ORDER — IPRATROPIUM-ALBUTEROL 0.5-2.5 (3) MG/3ML IN SOLN
3.0000 mL | Freq: Four times a day (QID) | RESPIRATORY_TRACT | Status: DC
  Administered 2016-12-11 (×4): 3 mL via RESPIRATORY_TRACT
  Filled 2016-12-10 (×4): qty 3

## 2016-12-10 MED ORDER — IPRATROPIUM-ALBUTEROL 0.5-2.5 (3) MG/3ML IN SOLN: 3.0000 mL | RESPIRATORY_TRACT | Status: DC | PRN

## 2016-12-10 MED ORDER — FUROSEMIDE 10 MG/ML IJ SOLN: 80.0000 mg | Freq: Every day | INTRAMUSCULAR | Status: DC

## 2016-12-10 MED ORDER — INFLUENZA VAC SPLIT HIGH-DOSE 0.5 ML IM SUSY
0.5000 mL | PREFILLED_SYRINGE | INTRAMUSCULAR | Status: AC
  Administered 2016-12-12: 0.5 mL via INTRAMUSCULAR
  Filled 2016-12-10 (×2): qty 0.5

## 2016-12-10 NOTE — Progress Notes (Signed)
  Echocardiogram 2D Echocardiogram has been performed.  Melissa Powers 12/10/2016, 2:00 PM

## 2016-12-10 NOTE — Progress Notes (Signed)
Patient ID: Melissa Powers, female   DOB: [birth year], 71 y.o.   MRN: [id number]  PROGRESS NOTE    Melissa Powers  [id number] DOB: May 17, 1945 DOA: 12/09/2016  PCP: Marco Collie, MD   Brief Narrative:  71 year old female with history of hypertension, dyslipidemia, diabetes mellitus, COPD, depression, atrial fibrillation on anticoagulation with xarelto, Parkinson's disease, diastolic congestive heart failure, obstructive sleep apnea, chronic kidney disease stage IV who presented to ED with worsening shortness of breath, fever and cough for past 3 days prior to this admission. Patient also reported associated wheezing and was found to have oxygen saturation in 70s on arrival. BNP was 636 on the admission. Troponin was within normal limits. Lactic acid was within normal limits. Chest x-ray showed mild interstitial edema. Urinalysis showed large leukocytes. Sepsis criteria met on the admission so patient was started on empiric antibiotics while awaiting culture results.   Assessment & Plan:   Principal Problem:   Acute on chronic respiratory failure with hypoxia (HCC) / acute COPD exacerbation - Hypoxia likely secondary to acute COPD exacerbation in addition to possible pneumonia and congestive heart failure - We will continue DuoNeb every 4 hours scheduled and will add DuoNeb every 4 hours as needed for shortness of breath or wheezing - We will continue Solu-Medrol 60 mg IV every 12 hours - Continue oxygen support via nasal cannula to keep oxygen saturation above 90% - Also, continue Lasix but reduced his dose from 80 mg IV every 12 hours down to 40 mg daily for possible acute diastolic CHF exacerbation - Continue antibiotics for pneumonia  Active Problems:   Sepsis secondary to pneumonia, unspecified organism / UTI - Sepsis criteria met on the admission, source is likely combination of pneumonia and UTI - Follow up urine culture and blood culture results - Continue azithromycin and Rocephin -  Lactic acid is within normal limit    Acute on chronic diastolic CHF   - Mild interstitial edema seen on chest x-ray - BNP in 600 range - Started Lasix 40 mg IV daily - Obtain 2-D echo - Monitor daily weight - Continue strict intake and output - Replete electrolytes    Essential hypertension - Continue Norvasc 10 mg daily, hydralazine 100 mg 3 times daily, Avapro 300 mg daily and metoprolol 50 mg daily    Parkinson's disease (HCC) - Continue carbidopa-levodopa    CKD (chronic kidney disease) stage 4, GFR 15-29 ml/min (HCC) - Continue to monitor renal function as patient is on Lasix    Paroxysmal A-fib (HCC) - CHA2DS2-VASc Scoreis 4 - Continue anticoagulation with xarelto - Continue rate control with metoprolol and amiodarone    Depression - Continue Celexa    Anemia of chronic kidney disease - Hemoglobin stable at 11.2   DVT prophylaxis:  continue  xarelto Code Status: full code  Family Communication:  no family at the bedside  Disposition Plan: Awaiting culture results, not yet stable for discharge   Consultants:   None  Procedures:   None  Antimicrobials:   Azithromycin and Rocephin 12/09/2016 -->   Subjective: Feels little bette this am since admission.   Objective: Vitals:   12/09/16 2232 12/10/16 0041 12/10/16 0434 12/10/16 0545  BP: (!) 105/45 (!) 111/50  (!) 102/42  Pulse: 65 64  (!) 58  Resp: 20 20  18   Temp:  99 F (37.2 C)  98.6 F (37 C)  TempSrc:  Oral  Oral  SpO2: 95% 96% 96% 97%  Weight:  98.9 kg (218 lb)  Height:  5' 3"  (1.6 m)      Intake/Output Summary (Last 24 hours) at 12/10/16 0659 Last data filed at 12/10/16 0005  Gross per 24 hour  Intake             1350 ml  Output               30 ml  Net             1320 ml   Filed Weights   12/09/16 2022 12/10/16 0041  Weight: 99.3 kg (219 lb) 98.9 kg (218 lb)    Examination:  General exam: Appears calm and comfortable  Respiratory system: wheezing and rhonchorous    Cardiovascular system: S1 & S2 heard, RRR.  Gastrointestinal system: Abdomen is nondistended, soft and nontender. No organomegaly or masses felt. Normal bowel sounds heard. Central nervous system: Alert and oriented. No focal neurological deficits. Extremities: Symmetric 5 x 5 power. Skin: No rashes, lesions or ulcers Psychiatry: Judgement and insight appear normal. Mood & affect appropriate.   Data Reviewed: I have personally reviewed following labs and imaging studies  CBC:  Recent Labs Lab 12/09/16 2035 12/09/16 2059  WBC 8.5  --   NEUTROABS 6.7  --   HGB 10.6* 11.2*  HCT 34.8* 33.0*  MCV 86.8  --   PLT 202  --    Basic Metabolic Panel:  Recent Labs Lab 12/09/16 2035 12/09/16 2059  NA 136 138  K 4.6 4.6  CL 101 100*  CO2 26  --   GLUCOSE 118* 119*  BUN 44* 45*  CREATININE 1.94* 2.00*  CALCIUM 9.0  --    GFR: Estimated Creatinine Clearance: 28.9 mL/min (A) (by C-G formula based on SCr of 2 mg/dL (H)). Liver Function Tests:  Recent Labs Lab 12/09/16 2035  AST 65*  ALT 53  ALKPHOS 80  BILITOT 0.5  PROT 7.2  ALBUMIN 3.6   No results for input(s): LIPASE, AMYLASE in the last 168 hours. No results for input(s): AMMONIA in the last 168 hours. Coagulation Profile:  Recent Labs Lab 12/09/16 2035  INR 1.20   Cardiac Enzymes: No results for input(s): CKTOTAL, CKMB, CKMBINDEX, TROPONINI in the last 168 hours. BNP (last 3 results) No results for input(s): PROBNP in the last 8760 hours. HbA1C: No results for input(s): HGBA1C in the last 72 hours. CBG:  Recent Labs Lab 12/10/16 0048  GLUCAP 137*   Lipid Profile: No results for input(s): CHOL, HDL, LDLCALC, TRIG, CHOLHDL, LDLDIRECT in the last 72 hours. Thyroid Function Tests: No results for input(s): TSH, T4TOTAL, FREET4, T3FREE, THYROIDAB in the last 72 hours. Anemia Panel: No results for input(s): VITAMINB12, FOLATE, FERRITIN, TIBC, IRON, RETICCTPCT in the last 72 hours. Urine analysis:     Component Value Date/Time   COLORURINE YELLOW 12/09/2016 2150   APPEARANCEUR CLEAR 12/09/2016 2150   LABSPEC 1.010 12/09/2016 2150   PHURINE 7.5 12/09/2016 2150   GLUCOSEU NEGATIVE 12/09/2016 2150   HGBUR NEGATIVE 12/09/2016 2150   BILIRUBINUR NEGATIVE 12/09/2016 2150   KETONESUR NEGATIVE 12/09/2016 2150   PROTEINUR 100 (A) 12/09/2016 2150   NITRITE POSITIVE (A) 12/09/2016 2150   LEUKOCYTESUR LARGE (A) 12/09/2016 2150   Sepsis Labs: @LABRCNTIP (procalcitonin:4,lacticidven:4)    Recent Results (from the past 240 hour(s))  MRSA PCR Screening     Status: None   Collection Time: 12/10/16  1:09 AM  Result Value Ref Range Status   MRSA by PCR NEGATIVE NEGATIVE Final      Radiology Studies: Dg Chest  Portable 1 View  Result Date: 12/09/2016 CLINICAL DATA:  Shortness of breath and nonproductive cough 3 days. EXAM: PORTABLE CHEST 1 VIEW COMPARISON:  25/24/1590 FINDINGS: Metallic device over the left chest with lead extending into the left neck unchanged. Lungs are somewhat hypoinflated with moderate bilateral perihilar opacification likely interstitial edema. Suggestion of a small left effusion. Mild cardiomegaly. Remainder the exam is unchanged. IMPRESSION: Mild cardiomegaly with findings suggesting mild interstitial edema and small left effusion. Electronically Signed   By: Marin Olp M.D.   On: 12/09/2016 21:37     Scheduled Meds: . amiodarone  200 mg Oral Daily  . amLODipine  10 mg Oral Daily  . carbidopa-levodopa  1 tablet Oral TID  . cholecalciferol  5,000 Units Oral Daily  . citalopram  20 mg Oral Daily  . dextromethorphan-guaiFENesin  1 tablet Oral BID  . furosemide  80 mg Intravenous Q12H  . gabapentin  300 mg Oral QHS  . hydrALAZINE  100 mg Oral TID  . insulin aspart  0-9 Units Subcutaneous TID WC  . insulin glargine  10 Units Subcutaneous QHS  . ipratropium-albuterol  3 mL Nebulization Q4H  . irbesartan  300 mg Oral Daily  . methylPREDNISolone (SOLU-MEDROL)  injection  60 mg Intravenous Q12H  . metoprolol succinate  50 mg Oral Daily  . Rivaroxaban  15 mg Oral Q supper  . rOPINIRole  4 mg Oral QHS   Continuous Infusions: . azithromycin    . cefTRIAXone (ROCEPHIN)  IV       LOS: 1 day    Time spent: 25 minutes  Greater than 50% of the time spent on counseling and coordinating the care.   Leisa Lenz, MD [facility] Pager [phone]  If 7PM-7AM, please contact night-coverage [email or web address] Password [facility] 12/10/2016, 6:59 AM

## 2016-12-11 LAB — CBC
HEMATOCRIT: 31.3 % — AB (ref 36.0–46.0)
Hemoglobin: 9.6 g/dL — ABNORMAL LOW (ref 12.0–15.0)
MCH: 26.4 pg (ref 26.0–34.0)
MCHC: 30.7 g/dL (ref 30.0–36.0)
MCV: 86 fL (ref 78.0–100.0)
PLATELETS: 183 10*3/uL (ref 150–400)
RBC: 3.64 MIL/uL — AB (ref 3.87–5.11)
RDW: 14.2 % (ref 11.5–15.5)
WBC: 9 10*3/uL (ref 4.0–10.5)

## 2016-12-11 LAB — BASIC METABOLIC PANEL
ANION GAP: 11 (ref 5–15)
BUN: 57 mg/dL — AB (ref 6–20)
CO2: 24 mmol/L (ref 22–32)
Calcium: 8.5 mg/dL — ABNORMAL LOW (ref 8.9–10.3)
Chloride: 98 mmol/L — ABNORMAL LOW (ref 101–111)
Creatinine, Ser: 1.93 mg/dL — ABNORMAL HIGH (ref 0.44–1.00)
GFR calc Af Amer: 29 mL/min — ABNORMAL LOW (ref >60)
GFR, EST NON AFRICAN AMERICAN: 25 mL/min — AB (ref >60)
GLUCOSE: 306 mg/dL — AB (ref 65–99)
POTASSIUM: 4.7 mmol/L (ref 3.5–5.1)
Sodium: 133 mmol/L — ABNORMAL LOW (ref 135–145)

## 2016-12-11 LAB — GLUCOSE, CAPILLARY
GLUCOSE-CAPILLARY: 261 mg/dL — AB (ref 65–99)
GLUCOSE-CAPILLARY: 346 mg/dL — AB (ref 65–99)
Glucose-Capillary: 275 mg/dL — ABNORMAL HIGH (ref 65–99)
Glucose-Capillary: 371 mg/dL — ABNORMAL HIGH (ref 65–99)

## 2016-12-11 LAB — URINE CULTURE

## 2016-12-11 MED ORDER — IPRATROPIUM-ALBUTEROL 0.5-2.5 (3) MG/3ML IN SOLN
3.0000 mL | Freq: Four times a day (QID) | RESPIRATORY_TRACT | Status: DC
  Administered 2016-12-12 (×2): 3 mL via RESPIRATORY_TRACT
  Filled 2016-12-11 (×2): qty 3

## 2016-12-11 MED ORDER — AZITHROMYCIN 500 MG PO TABS
500.0000 mg | ORAL_TABLET | Freq: Every day | ORAL | Status: DC
  Administered 2016-12-11: 500 mg via ORAL
  Filled 2016-12-11: qty 1

## 2016-12-11 NOTE — Progress Notes (Signed)
Patient ID: Melissa Powers, female   DOB: 1945/04/09, 71 y.o.   MRN: [id number]  PROGRESS NOTE    Melissa Powers  [id number]:[id number] DOB: 1946/02/26 DOA: 12/09/2016  PCP: Marco Collie, MD   Brief Narrative:  71 year old female with history of hypertension, dyslipidemia, diabetes mellitus, COPD, depression, atrial fibrillation on anticoagulation with xarelto, Parkinson's disease, diastolic congestive heart failure, obstructive sleep apnea, chronic kidney disease stage IV who presented to ED with worsening shortness of breath, fever and cough for past 3 days prior to this admission. Patient also reported associated wheezing and was found to have oxygen saturation in 70s on arrival. BNP was 636 on the admission. Troponin was within normal limits. Lactic acid was within normal limits. Chest x-ray showed mild interstitial edema. Urinalysis showed large leukocytes. Sepsis criteria met on the admission so patient was started on empiric antibiotics while awaiting culture results.   Assessment & Plan:   Principal Problem:   Acute on chronic respiratory failure with hypoxia (HCC) / acute COPD exacerbation - Hypoxia likely secondary to acute COPD exacerbation in addition to possible pneumonia and congestive heart failure - Continue Duoneb every 6 hours scheduled and as needed for shortness of breath or wheezing - Continue solumedrol, decrease frequency form Q 12 hours to Q24 hours  - Continue oxygen support via [place] to keep O2 sats above 90% - Continue oxygen support via nasal cannula to keep oxygen saturation above 90%  Active Problems:   Sepsis secondary to pneumonia, unspecified organism / UTI - Sepsis criteria met on the admission, source is likely combination of pneumonia and UTI - Lactic acid is WNL - Urine and blood cultures pending  - Continue Azithromycin and Rocephin     Acute on chronic diastolic CHF   - Mild interstitial edema on CXR  - BNP in 600 range  - Continue lasix 40 mg IV daily - Weight  99.3 kg --> 98.9 kg - Weight this am pending - Continue daily weight, strict intake and output - ECHO showed preserved EF    Essential hypertension - Continue current BP meds    Parkinson's disease (HCC) - Continue Sinemet     CKD (chronic kidney disease) stage 4, GFR 15-29 ml/min (HCC) - Cr stable at 2 --> 1.93 - Monitor daily BMP as pt on IV lasix     Paroxysmal A-fib (HCC) - CHA2DS2-VASc Scoreis 4 - Continue AC with xarelto  - Rate controlled with metoprolol and amiodarone     Depression - Continue celexa    Anemia of chronic kidney disease - Hgb stable    DVT prophylaxis: Xarelto  Code Status: full code  Family Communication:  No family at the bedside  Disposition Plan: Dischange in 48 hours once cx results are    Consultants:   None   Procedures:   ECHO 9/23 --> EF 65%  Antimicrobials:   Azithromycin and Rocephin 12/09/2016 -->   Subjective: No overnight events.  Objective: Vitals:   12/10/16 2010 12/11/16 0132 12/11/16 0434 12/11/16 0900  BP:   102/62 (!) 122/57  Pulse:   64 64  Resp:   20 20  Temp:   98.2 F (36.8 C) 98.1 F (36.7 C)  TempSrc:   Oral Oral  SpO2: 97% 96% 96%   Weight:   98.5 kg (217 lb 1.6 oz)   Height:        Intake/Output Summary (Last 24 hours) at 12/11/16 1152 Last data filed at 12/11/16 0911  Gross per 24 hour  Intake  1020 ml  Output             1350 ml  Net             -330 ml   Filed Weights   12/09/16 2022 12/10/16 0041 12/11/16 0434  Weight: 99.3 kg (219 lb) 98.9 kg (218 lb) 98.5 kg (217 lb 1.6 oz)    Physical Exam  Constitutional: Appears well-developed and well-nourished. No distress.  CVS: RRR, S1/S2 + Pulmonary: wheezing and rhonchi appreciated  Abdominal: Soft. BS +,  no distension, tenderness, rebound or guarding.  Musculoskeletal: Normal range of motion. No edema and no tenderness.  Lymphadenopathy: No lymphadenopathy noted, cervical, inguinal. Neuro: Alert. Normal reflexes, muscle  tone coordination. No cranial nerve deficit. Skin: Skin is warm and dry. Psychiatric: Normal mood and affect. Behavior, judgment, thought content normal.      Data Reviewed: I have personally reviewed following labs and imaging studies  CBC:  Recent Labs Lab 12/09/16 2035 12/09/16 2059 12/11/16 0426  WBC 8.5  --  9.0  NEUTROABS 6.7  --   --   HGB 10.6* 11.2* 9.6*  HCT 34.8* 33.0* 31.3*  MCV 86.8  --  86.0  PLT 202  --  885   Basic Metabolic Panel:  Recent Labs Lab 12/09/16 2035 12/09/16 2059 12/11/16 0426  NA 136 138 133*  K 4.6 4.6 4.7  CL 101 100* 98*  CO2 26  --  24  GLUCOSE 118* 119* 306*  BUN 44* 45* 57*  CREATININE 1.94* 2.00* 1.93*  CALCIUM 9.0  --  8.5*   GFR: Estimated Creatinine Clearance: 29.9 mL/min (A) (by C-G formula based on SCr of 1.93 mg/dL (H)). Liver Function Tests:  Recent Labs Lab 12/09/16 2035  AST 65*  ALT 53  ALKPHOS 80  BILITOT 0.5  PROT 7.2  ALBUMIN 3.6   No results for input(s): LIPASE, AMYLASE in the last 168 hours. No results for input(s): AMMONIA in the last 168 hours. Coagulation Profile:  Recent Labs Lab 12/09/16 2035  INR 1.20   Cardiac Enzymes: No results for input(s): CKTOTAL, CKMB, CKMBINDEX, TROPONINI in the last 168 hours. BNP (last 3 results) No results for input(s): PROBNP in the last 8760 hours. HbA1C: No results for input(s): HGBA1C in the last 72 hours. CBG:  Recent Labs Lab 12/10/16 1142 12/10/16 1600 12/10/16 2111 12/11/16 0740 12/11/16 1119  GLUCAP 355* 314* 274* 275* 261*   Lipid Profile: No results for input(s): CHOL, HDL, LDLCALC, TRIG, CHOLHDL, LDLDIRECT in the last 72 hours. Thyroid Function Tests: No results for input(s): TSH, T4TOTAL, FREET4, T3FREE, THYROIDAB in the last 72 hours. Anemia Panel: No results for input(s): VITAMINB12, FOLATE, FERRITIN, TIBC, IRON, RETICCTPCT in the last 72 hours. Urine analysis:    Component Value Date/Time   COLORURINE YELLOW 12/09/2016 2150    APPEARANCEUR CLEAR 12/09/2016 2150   LABSPEC 1.010 12/09/2016 2150   PHURINE 7.5 12/09/2016 2150   GLUCOSEU NEGATIVE 12/09/2016 2150   HGBUR NEGATIVE 12/09/2016 2150   BILIRUBINUR NEGATIVE 12/09/2016 2150   KETONESUR NEGATIVE 12/09/2016 2150   PROTEINUR 100 (A) 12/09/2016 2150   NITRITE POSITIVE (A) 12/09/2016 2150   LEUKOCYTESUR LARGE (A) 12/09/2016 2150   Sepsis Labs: _0 (procalcitonin:4,lacticidven:4)    Recent Results (from the past 240 hour(s))  MRSA PCR Screening     Status: None   Collection Time: 12/10/16  1:09 AM  Result Value Ref Range Status   MRSA by PCR NEGATIVE NEGATIVE Final      Radiology Studies: Dg  Chest Portable 1 View  Result Date: 12/09/2016 CLINICAL DATA:  Shortness of breath and nonproductive cough 3 days. EXAM: PORTABLE CHEST 1 VIEW COMPARISON:  49/44/9675 FINDINGS: Metallic device over the left chest with lead extending into the left neck unchanged. Lungs are somewhat hypoinflated with moderate bilateral perihilar opacification likely interstitial edema. Suggestion of a small left effusion. Mild cardiomegaly. Remainder the exam is unchanged. IMPRESSION: Mild cardiomegaly with findings suggesting mild interstitial edema and small left effusion. Electronically Signed   By: Marin Olp M.D.   On: 12/09/2016 21:37     Scheduled Meds: . amiodarone  200 mg Oral Daily  . amLODipine  10 mg Oral Daily  . azithromycin  500 mg Oral QHS  . carbidopa-levodopa  1 tablet Oral TID  . cholecalciferol  5,000 Units Oral Daily  . citalopram  20 mg Oral Daily  . dextromethorphan-guaiFENesin  1 tablet Oral BID  . furosemide  40 mg Intravenous Daily  . gabapentin  300 mg Oral QHS  . hydrALAZINE  100 mg Oral TID  . Influenza vac split quadrivalent PF  0.5 mL Intramuscular Tomorrow-1000  . insulin aspart  0-9 Units Subcutaneous TID WC  . insulin glargine  10 Units Subcutaneous QHS  . ipratropium-albuterol  3 mL Nebulization Q6H  . irbesartan  300 mg Oral Daily    . methylPREDNISolone (SOLU-MEDROL) injection  60 mg Intravenous Q12H  . metoprolol succinate  50 mg Oral Daily  . Rivaroxaban  15 mg Oral Q supper  . rOPINIRole  4 mg Oral QHS   Continuous Infusions: . cefTRIAXone (ROCEPHIN)  IV Stopped (12/10/16 2350)     LOS: 2 days    Time spent: 25 minutes  Greater than 50% of the time spent on counseling and coordinating the care.   Leisa Lenz, MD [facility] Pager [phone]  If 7PM-7AM, please contact night-coverage [email or web address] Password [facility] 12/11/2016, 11:52 AM

## 2016-12-11 NOTE — Progress Notes (Signed)
Inpatient Diabetes Program Recommendations  AACE/ADA: New Consensus Statement on Inpatient Glycemic Control (2015)  Target Ranges:  Prepandial:   less than 140 mg/dL      Peak postprandial:   less than 180 mg/dL (1-2 hours)      Critically ill patients:  140 - 180 mg/dL   Results for ENDORA, TERESI (MRN [id number]) as of 12/11/2016 10:25  Ref. Range 12/10/2016 07:17 12/10/2016 11:42 12/10/2016 16:00 12/10/2016 21:11 12/11/2016 07:40  Glucose-Capillary Latest Ref Range: 65 - 99 mg/dL 229 (H) 355 (H) 314 (H) 274 (H) 275 (H)    Review of Glycemic Control  Diabetes history: DM2 Outpatient Diabetes medications: Lantus 15 units QHS Current orders for Inpatient glycemic control: Lantus 10 units QHS, Novolog 0-9 units TID with meals  Inpatient Diabetes Program Recommendations: Insulin - Basal: If steroids are continued, please consider increasing Lantus to 20 units QHS. Correction (SSI): Please consider ordering Novolog 0-5 units QHS for bedtime correction. Insulin - Meal Coverage: If steroids are continued, please consider ordering Novolog 3 units TID with meals for meal coverage if patient eats at least 50% of meals. Diet: Added Carb Modified to Heart Healthy diet.  Thanks, Barnie Alderman, RN, MSN, CDE Diabetes Coordinator Inpatient Diabetes Program [phone] (Team Pager from 8am to 5pm)

## 2016-12-12 DIAGNOSIS — J9621 Acute and chronic respiratory failure with hypoxia: Secondary | ICD-10-CM

## 2016-12-12 LAB — GLUCOSE, CAPILLARY
GLUCOSE-CAPILLARY: 325 mg/dL — AB (ref 65–99)
Glucose-Capillary: 299 mg/dL — ABNORMAL HIGH (ref 65–99)

## 2016-12-12 LAB — BASIC METABOLIC PANEL
ANION GAP: 10 (ref 5–15)
BUN: 69 mg/dL — ABNORMAL HIGH (ref 6–20)
CHLORIDE: 99 mmol/L — AB (ref 101–111)
CO2: 25 mmol/L (ref 22–32)
CREATININE: 2.08 mg/dL — AB (ref 0.44–1.00)
Calcium: 8.8 mg/dL — ABNORMAL LOW (ref 8.9–10.3)
GFR calc non Af Amer: 23 mL/min — ABNORMAL LOW (ref >60)
GFR, EST AFRICAN AMERICAN: 26 mL/min — AB (ref >60)
Glucose, Bld: 281 mg/dL — ABNORMAL HIGH (ref 65–99)
POTASSIUM: 4.4 mmol/L (ref 3.5–5.1)
Sodium: 134 mmol/L — ABNORMAL LOW (ref 135–145)

## 2016-12-12 LAB — CBC
HEMATOCRIT: 31.1 % — AB (ref 36.0–46.0)
HEMOGLOBIN: 9.6 g/dL — AB (ref 12.0–15.0)
MCH: 26.4 pg (ref 26.0–34.0)
MCHC: 30.9 g/dL (ref 30.0–36.0)
MCV: 85.7 fL (ref 78.0–100.0)
Platelets: 225 10*3/uL (ref 150–400)
RBC: 3.63 MIL/uL — ABNORMAL LOW (ref 3.87–5.11)
RDW: 14.3 % (ref 11.5–15.5)
WBC: 12.4 10*3/uL — ABNORMAL HIGH (ref 4.0–10.5)

## 2016-12-12 MED ORDER — TORSEMIDE 20 MG PO TABS: 20.0000 mg | ORAL_TABLET | ORAL | Status: DC

## 2016-12-12 MED ORDER — INSULIN GLARGINE 100 UNIT/ML ~~LOC~~ SOLN
20.0000 [IU] | Freq: Every day | SUBCUTANEOUS | Status: DC
  Filled 2016-12-12: qty 0.2

## 2016-12-12 MED ORDER — PREDNISONE 10 MG (21) PO TBPK: ORAL_TABLET | ORAL | 0 refills | Status: AC

## 2016-12-12 NOTE — Care Management Important Message (Signed)
Important Message  Patient Details  Name: Melissa Powers MRN: [id number] Date of Birth: [birth year]   Medicare Important Message Given:  Yes (signed copy)    Erenest Rasher, RN 12/12/2016, 11:50 AM

## 2016-12-12 NOTE — Care Management Note (Signed)
Case Management Note  Patient Details  Name: Melissa Powers MRN: [id number] Date of Birth: [birth year]  Subjective/Objective:     Acute resp failure with hypoxia, COPD exac, sepsis with PNA, CHF               Action/Plan: Discharge Planning: NCM spoke to pt and she lives at home with husband. [facility]. States she was not approved to have aide in the home. States she has discussed with [facility]. She has neb machine, RW and bedside commode. Unit RN will check a walking sat.   PCP Marco Collie MD  Expected Discharge Date:  12/12/16               Expected Discharge Plan:  Home/Self Care  In-House Referral:  NA  Discharge planning Services  CM Consult  Post Acute Care Choice:  NA Choice offered to:  NA  DME Arranged:  N/A DME Agency:  NA  HH Arranged:  NA HH Agency:  NA  Status of Service:  Completed, signed off  If discussed at [place] of Stay Meetings, dates discussed:    Additional Comments:  Erenest Rasher, RN 12/12/2016, 11:50 AM

## 2016-12-12 NOTE — Discharge Instructions (Signed)

## 2016-12-12 NOTE — Progress Notes (Signed)
02 Sat = 95% room air at rest and stayed at 95% on room air while ambulating, so no 02 applied during ambulation

## 2016-12-12 NOTE — Discharge Summary (Addendum)
Physician Discharge Summary  Melissa Powers [id number] DOB: Jan 16, 1946 DOA: 12/09/2016  PCP: Dr. Doyle Askew of [facility] [place]   Admit date: 12/09/2016 Discharge date: 12/12/2016  Time spent: 35 minutes  Recommendations for Outpatient Follow-up:  1. Recommend basic metabolic panel as well as CBC in 1 week 2. Recommend tapering doses of steroids as an outpatient as per instructions-30 mg for 2 days then 20 mg 2 days and then 10 mg for 2 days and stop 3. Discontinued hydralazine this admission and instead will continue avapro 4. She will need to be seen by her [facility] as an outpatient 5. Would consider discussion about bleeding risk secondary to inadvertent falls--she has A. Fib but she also has Parkinson's and she may be at high risk for falls 6. Recommend also workup of incontinence of f bowel-uses depends at home for the past year  Discharge Diagnoses:  Principal Problem:   Acute on chronic respiratory failure with hypoxia ([place]) Active Problems:   Essential hypertension   Parkinson's disease (HCC)   CKD (chronic kidney disease) stage 4, GFR 15-29 ml/min (HCC)   Type II diabetes mellitus with renal manifestations (HCC)   Sepsis, unspecified organism (HCC)   A-fib (HCC)   Depression   Acute on chronic diastolic CHF (congestive heart failure) ([place])   COPD with acute exacerbation ([place])   Discharge Condition: improved  Diet recommendation: diabetic heart healthy  Filed Weights   12/10/16 0041 12/11/16 0434 12/12/16 0500  Weight: 98.9 kg (218 lb) 98.5 kg (217 lb 1.6 oz) 98.6 kg (217 lb 4.8 oz)    History of present illness:  71 year old with diabetes and dyslipidemia COPD atrial fibrillation status post cardioversion as well as chronic rhythm control maintained on amiodarone as well as Xarelto Obstructive sleep apnea, Chronic kidney disease stage IV presented with cough fever BNP found to be 630 chest x-ray showed edema on 9/22  Hospital Course:  Acute on chronic  respiratory failure with hypoxia, COPD exacerbation, acute diastolic heart failure Rhinovirus, enterovirus positive this admission  Treated for hypoxic respiratory failure secondary to multiple components as above  On discharge resuming home diuretics orally, given prednisone taper and she will need close follow-up with her physician  Supportive management for upper respiratory viral illness   ?No pneumonia on x-ray  Was initially started on Rocephin and azithromycin which was continued and discontinued on discharge--she did not have clinical evidence of pneumonia on discharge and I do not think she had it on admission actic acid was 1.1 and procalcitonin was always below 0.10  Hypertension  Patient will continue amlodipine and metoprolol but I have discontinued hydralazine 3 times a day secondary to difficulty with compliance  She will need close follow-up as an outpatient with labs and we can continue her losartan carefully in a setting of diuresis  Chronic kidney disease stage IV  Creatinine peaked at 2.08  She does seem clinically better although she is slightly tired  We will reassess this as an outpatient  Parkinson's disease  Needs close multimodal follow-up on discharge  Incontinence of stool  Needs outpatient assessment and management     Discharge Exam: Vitals:   12/12/16 0726 12/12/16 0739  BP: (!) 149/62   Pulse: 66   Resp: (!) 22   Temp: 97.8 F (36.6 C)   SpO2: 98% 95%    General: alert pleasant oriented no distress feels tired otherwise well Cardiovascular: S1-S2 no murmur rub or gallop Respiratory: clinically clear no added sound  Discharge  Instructions   Discharge Instructions    Diet - low sodium heart healthy    Complete by:  As directed    Discharge instructions    Complete by:  As directed    Please follow-up with [facility] in [place] and get your weights checked regularly You will need to continue low-dose of prednisone 20 mg daily for  another 5 days I would recommend that you take Demadex at your usual home dose Please also noticed changes to her home meds and nature you have a pill bottle You will need to get your incontinence evaluated as an outpatient   Increase activity slowly    Complete by:  As directed      Current Discharge Medication List    START taking these medications   Details  predniSONE (STERAPRED UNI-PAK 21 TAB) 10 MG (21) TBPK tablet Take 3 tablets tablets for the first 2 days then 2 tablets tablet for 2 days Then 1 tablet daily until all finished Qty: 12 tablet, Refills: 0      CONTINUE these medications which have NOT CHANGED   Details  amiodarone (PACERONE) 200 MG tablet Take 1 tablet (200 mg total) by mouth daily. Qty: 30 tablet, Refills: 3    amLODipine (NORVASC) 10 MG tablet Take 1 tablet (10 mg total) by mouth daily. Qty: 30 tablet, Refills: 6    carbidopa-levodopa (SINEMET IR) 25-100 MG per tablet Take 1 tablet by mouth 3 (three) times daily.    Cholecalciferol (VITAMIN D-3) 5000 units TABS Take 1 tablet by mouth daily.    citalopram (CELEXA) 20 MG tablet Take 20 mg by mouth daily.    gabapentin (NEURONTIN) 300 MG capsule Take 300 mg by mouth at bedtime.    insulin glargine (LANTUS) 100 UNIT/ML injection Inject 15 Units into the skin at bedtime.    irbesartan (AVAPRO) 300 MG tablet Take 300 mg by mouth daily.    metoprolol succinate (TOPROL-XL) 50 MG 24 hr tablet Take 50 mg by mouth daily. Take with or immediately following a meal.    potassium chloride SA (K-DUR,KLOR-CON) 20 MEQ tablet Take 1 tablet (20 mEq total) by mouth once a week. ON FRIDAYS. AND AS NEEDED WITH METOLAZONE DOSE Qty: 10 tablet, Refills: 3   Associated Diagnoses: Essential hypertension    PROAIR HFA 108 (90 Base) MCG/ACT inhaler Inhale 2 puffs into the lungs 4 (four) times daily as needed for wheezing or shortness of breath.  Refills: 0    Rivaroxaban (XARELTO) 15 MG TABS tablet Take 15 mg by mouth daily  with supper.    rOPINIRole (REQUIP) 4 MG tablet Take 4 mg by mouth at bedtime.    torsemide (DEMADEX) 20 MG tablet Take 20 mg by mouth 2 (two) times a week. On Monday and Friday    meclizine (ANTIVERT) 12.5 MG tablet Take 1 tablet (12.5 mg total) by mouth 2 (two) times daily as needed for dizziness. Qty: 30 tablet, Refills: 0    phenol (CHLORASEPTIC) 1.4 % LIQD Use as directed 1 spray in the mouth or throat as needed for throat irritation / pain. Refills: 0      STOP taking these medications     hydrALAZINE (APRESOLINE) 100 MG tablet        Allergies  Allergen Reactions  . Morphine     Mental Status Changes (intolerance)  . Morphine And Related Other (See Comments)    Pt reports intolerance due to sedation from morphine      The results of significant  diagnostics from this hospitalization (including imaging, microbiology, ancillary and laboratory) are listed below for reference.    Significant Diagnostic Studies: Dg Chest Portable 1 View  Result Date: 12/09/2016 CLINICAL DATA:  Shortness of breath and nonproductive cough 3 days. EXAM: PORTABLE CHEST 1 VIEW COMPARISON:  54/11/8117 FINDINGS: Metallic device over the left chest with lead extending into the left neck unchanged. Lungs are somewhat hypoinflated with moderate bilateral perihilar opacification likely interstitial edema. Suggestion of a small left effusion. Mild cardiomegaly. Remainder the exam is unchanged. IMPRESSION: Mild cardiomegaly with findings suggesting mild interstitial edema and small left effusion. Electronically Signed   By: Marin Olp M.D.   On: 12/09/2016 21:37    Microbiology: Recent Results (from the past 240 hour(s))  Culture, blood (Routine x 2)     Status: None (Preliminary result)   Collection Time: 12/09/16  8:36 PM  Result Value Ref Range Status   Specimen Description BLOOD BLOOD RIGHT FOREARM  Final   Special Requests   Final    BOTTLES DRAWN AEROBIC AND ANAEROBIC Blood Culture adequate  volume   Culture NO GROWTH 2 DAYS  Final   Report Status PENDING  Incomplete  Culture, blood (Routine x 2)     Status: None (Preliminary result)   Collection Time: 12/09/16  9:01 PM  Result Value Ref Range Status   Specimen Description BLOOD RIGHT ANTECUBITAL  Final   Special Requests IN PEDIATRIC BOTTLE Blood Culture adequate volume  Final   Culture NO GROWTH 2 DAYS  Final   Report Status PENDING  Incomplete  Respiratory Panel by PCR     Status: Abnormal   Collection Time: 12/09/16  9:05 PM  Result Value Ref Range Status   Adenovirus NOT DETECTED NOT DETECTED Final   Coronavirus 229E NOT DETECTED NOT DETECTED Final   Coronavirus HKU1 NOT DETECTED NOT DETECTED Final   Coronavirus NL63 NOT DETECTED NOT DETECTED Final   Coronavirus OC43 NOT DETECTED NOT DETECTED Final   Metapneumovirus NOT DETECTED NOT DETECTED Final   Rhinovirus / Enterovirus DETECTED (A) NOT DETECTED Final   Influenza A NOT DETECTED NOT DETECTED Final   Influenza B NOT DETECTED NOT DETECTED Final   Parainfluenza Virus 1 NOT DETECTED NOT DETECTED Final   Parainfluenza Virus 2 NOT DETECTED NOT DETECTED Final   Parainfluenza Virus 3 NOT DETECTED NOT DETECTED Final   Parainfluenza Virus 4 NOT DETECTED NOT DETECTED Final   Respiratory Syncytial Virus NOT DETECTED NOT DETECTED Final   Bordetella pertussis NOT DETECTED NOT DETECTED Final   Chlamydophila pneumoniae NOT DETECTED NOT DETECTED Final   Mycoplasma pneumoniae NOT DETECTED NOT DETECTED Final  Urine culture     Status: Abnormal   Collection Time: 12/09/16  9:50 PM  Result Value Ref Range Status   Specimen Description URINE, CATHETERIZED  Final   Special Requests NONE  Final   Culture MULTIPLE SPECIES PRESENT, SUGGEST RECOLLECTION (A)  Final   Report Status 12/11/2016 FINAL  Final  MRSA PCR Screening     Status: None   Collection Time: 12/10/16  1:09 AM  Result Value Ref Range Status   MRSA by PCR NEGATIVE NEGATIVE Final    Comment:        The GeneXpert MRSA  Assay (FDA approved for NASAL specimens only), is one component of a comprehensive MRSA colonization surveillance program. It is not intended to diagnose MRSA infection nor to guide or monitor treatment for MRSA infections.      Labs: Basic Metabolic Panel:  Recent Labs Lab  12/09/16 2035 12/09/16 2059 12/11/16 0426 12/12/16 0512  NA 136 138 133* 134*  K 4.6 4.6 4.7 4.4  CL 101 100* 98* 99*  CO2 26  --  24 25  GLUCOSE 118* 119* 306* 281*  BUN 44* 45* 57* 69*  CREATININE 1.94* 2.00* 1.93* 2.08*  CALCIUM 9.0  --  8.5* 8.8*   Liver Function Tests:  Recent Labs Lab 12/09/16 2035  AST 65*  ALT 53  ALKPHOS 80  BILITOT 0.5  PROT 7.2  ALBUMIN 3.6   No results for input(s): LIPASE, AMYLASE in the last 168 hours. No results for input(s): AMMONIA in the last 168 hours. CBC:  Recent Labs Lab 12/09/16 2035 12/09/16 2059 12/11/16 0426 12/12/16 0512  WBC 8.5  --  9.0 12.4*  NEUTROABS 6.7  --   --   --   HGB 10.6* 11.2* 9.6* 9.6*  HCT 34.8* 33.0* 31.3* 31.1*  MCV 86.8  --  86.0 85.7  PLT 202  --  183 225   Cardiac Enzymes: No results for input(s): CKTOTAL, CKMB, CKMBINDEX, TROPONINI in the last 168 hours. BNP: BNP (last 3 results)  Recent Labs  03/17/16 1716 03/22/16 1634 12/09/16 2033  BNP 41.5 190.7* 636.4*    ProBNP (last 3 results) No results for input(s): PROBNP in the last 8760 hours.  CBG:  Recent Labs Lab 12/11/16 0740 12/11/16 1119 12/11/16 1654 12/11/16 2111 12/12/16 0815  GLUCAP 275* 261* 346* 371* 299*       Signed:  Nita Sells MD   [facility] 12/12/2016, 11:19 AM

## 2016-12-12 NOTE — Progress Notes (Signed)
Inpatient Diabetes Program Recommendations  AACE/ADA: New Consensus Statement on Inpatient Glycemic Control (2015)  Target Ranges:  Prepandial:   less than 140 mg/dL      Peak postprandial:   less than 180 mg/dL (1-2 hours)      Critically ill patients:  140 - 180 mg/dL   Results for ALEECE, LOYD (MRN [id number]) as of 12/12/2016 08:29  Ref. Range 12/11/2016 07:40 12/11/2016 11:19 12/11/2016 16:54 12/11/2016 21:11 12/12/2016 08:15  Glucose-Capillary Latest Ref Range: 65 - 99 mg/dL 275 (H) 261 (H) 346 (H) 371 (H) 299 (H)   Review of Glycemic Control  Diabetes history: DM2 Outpatient Diabetes medications: Lantus 15 units QHS Current orders for Inpatient glycemic control: Lantus 10 units QHS, Novolog 0-9 units TID with meals  Inpatient Diabetes Program Recommendations: Insulin - Basal: If steroids are continued, please consider increasing Lantus to 20 units QHS. Correction (SSI): Please consider ordering Novolog 0-5 units QHS for bedtime correction. Insulin - Meal Coverage: If steroids are continued, please consider ordering Novolog 3 units TID with meals for meal coverage if patient eats at least 50% of meals.  Thanks, Barnie Alderman, RN, MSN, CDE Diabetes Coordinator Inpatient Diabetes Program [phone] (Team Pager from 8am to 5pm)

## 2016-12-12 NOTE — Progress Notes (Signed)
Reviewed all discharge instructions with patient and she stated understanding.  Patient confirmed she has clothing, cell phone and charger, dentures, and glasses from home with her and ready to take home.  She is awaiting her ride home.  No voiced complaints.

## 2016-12-14 LAB — CULTURE, BLOOD (ROUTINE X 2)
CULTURE: NO GROWTH
Culture: NO GROWTH
Special Requests: ADEQUATE
Special Requests: ADEQUATE

## 2016-12-26 DIAGNOSIS — I5033 Acute on chronic diastolic (congestive) heart failure: Secondary | ICD-10-CM

## 2017-01-15 DIAGNOSIS — C641 Malignant neoplasm of right kidney, except renal pelvis: Secondary | ICD-10-CM | POA: Diagnosis not present

## 2017-03-01 ENCOUNTER — Telehealth (HOSPITAL_COMMUNITY): Payer: Self-pay

## 2017-03-01 DIAGNOSIS — I130 Hypertensive heart and chronic kidney disease with heart failure and stage 1 through stage 4 chronic kidney disease, or unspecified chronic kidney disease: Secondary | ICD-10-CM | POA: Diagnosis not present

## 2017-03-01 DIAGNOSIS — I5033 Acute on chronic diastolic (congestive) heart failure: Secondary | ICD-10-CM | POA: Diagnosis not present

## 2017-03-01 DIAGNOSIS — R0902 Hypoxemia: Secondary | ICD-10-CM | POA: Diagnosis not present

## 2017-03-01 DIAGNOSIS — N184 Chronic kidney disease, stage 4 (severe): Secondary | ICD-10-CM | POA: Diagnosis not present

## 2017-03-01 NOTE — Telephone Encounter (Signed)
Pt is in hospital in [place] may not make 12/17 appointment. Husband will call on Friday to verify.

## 2017-03-02 DIAGNOSIS — I5033 Acute on chronic diastolic (congestive) heart failure: Secondary | ICD-10-CM | POA: Diagnosis not present

## 2017-03-02 DIAGNOSIS — R0902 Hypoxemia: Secondary | ICD-10-CM | POA: Diagnosis not present

## 2017-03-02 DIAGNOSIS — N184 Chronic kidney disease, stage 4 (severe): Secondary | ICD-10-CM | POA: Diagnosis not present

## 2017-03-02 DIAGNOSIS — I130 Hypertensive heart and chronic kidney disease with heart failure and stage 1 through stage 4 chronic kidney disease, or unspecified chronic kidney disease: Secondary | ICD-10-CM | POA: Diagnosis not present

## 2017-03-03 DIAGNOSIS — N184 Chronic kidney disease, stage 4 (severe): Secondary | ICD-10-CM | POA: Diagnosis not present

## 2017-03-03 DIAGNOSIS — I130 Hypertensive heart and chronic kidney disease with heart failure and stage 1 through stage 4 chronic kidney disease, or unspecified chronic kidney disease: Secondary | ICD-10-CM | POA: Diagnosis not present

## 2017-03-03 DIAGNOSIS — R0902 Hypoxemia: Secondary | ICD-10-CM | POA: Diagnosis not present

## 2017-03-03 DIAGNOSIS — I5033 Acute on chronic diastolic (congestive) heart failure: Secondary | ICD-10-CM | POA: Diagnosis not present

## 2017-03-04 DIAGNOSIS — I130 Hypertensive heart and chronic kidney disease with heart failure and stage 1 through stage 4 chronic kidney disease, or unspecified chronic kidney disease: Secondary | ICD-10-CM | POA: Diagnosis not present

## 2017-03-04 DIAGNOSIS — R0902 Hypoxemia: Secondary | ICD-10-CM | POA: Diagnosis not present

## 2017-03-04 DIAGNOSIS — N184 Chronic kidney disease, stage 4 (severe): Secondary | ICD-10-CM | POA: Diagnosis not present

## 2017-03-04 DIAGNOSIS — I5033 Acute on chronic diastolic (congestive) heart failure: Secondary | ICD-10-CM | POA: Diagnosis not present

## 2017-03-04 DIAGNOSIS — R0602 Shortness of breath: Secondary | ICD-10-CM | POA: Diagnosis not present

## 2017-03-05 ENCOUNTER — Inpatient Hospital Stay (HOSPITAL_COMMUNITY)
Admission: RE | Admit: 2017-03-05 | Payer: Medicare (Managed Care) | Source: Ambulatory Visit | Admitting: Internal Medicine

## 2017-03-05 DIAGNOSIS — I5033 Acute on chronic diastolic (congestive) heart failure: Secondary | ICD-10-CM | POA: Diagnosis not present

## 2017-03-05 DIAGNOSIS — R0902 Hypoxemia: Secondary | ICD-10-CM | POA: Diagnosis not present

## 2017-03-05 DIAGNOSIS — I130 Hypertensive heart and chronic kidney disease with heart failure and stage 1 through stage 4 chronic kidney disease, or unspecified chronic kidney disease: Secondary | ICD-10-CM | POA: Diagnosis not present

## 2017-03-05 DIAGNOSIS — N184 Chronic kidney disease, stage 4 (severe): Secondary | ICD-10-CM | POA: Diagnosis not present

## 2017-03-06 DIAGNOSIS — I130 Hypertensive heart and chronic kidney disease with heart failure and stage 1 through stage 4 chronic kidney disease, or unspecified chronic kidney disease: Secondary | ICD-10-CM | POA: Diagnosis not present

## 2017-03-06 DIAGNOSIS — R0902 Hypoxemia: Secondary | ICD-10-CM | POA: Diagnosis not present

## 2017-03-06 DIAGNOSIS — I5033 Acute on chronic diastolic (congestive) heart failure: Secondary | ICD-10-CM | POA: Diagnosis not present

## 2017-03-06 DIAGNOSIS — N184 Chronic kidney disease, stage 4 (severe): Secondary | ICD-10-CM | POA: Diagnosis not present

## 2017-05-23 ENCOUNTER — Ambulatory Visit: Payer: Medicare (Managed Care) | Admitting: Internal Medicine

## 2017-06-18 DEATH — deceased

## 2018-04-03 IMAGING — DX DG THORACIC SPINE 2V
2 series · 2 of 2 positions shown · non-contrast
Comparison: Lateral chest x-ray 08/12/2015

CLINICAL DATA: Fall

EXAM:
THORACIC SPINE 2 VIEWS

[t-spine ap]
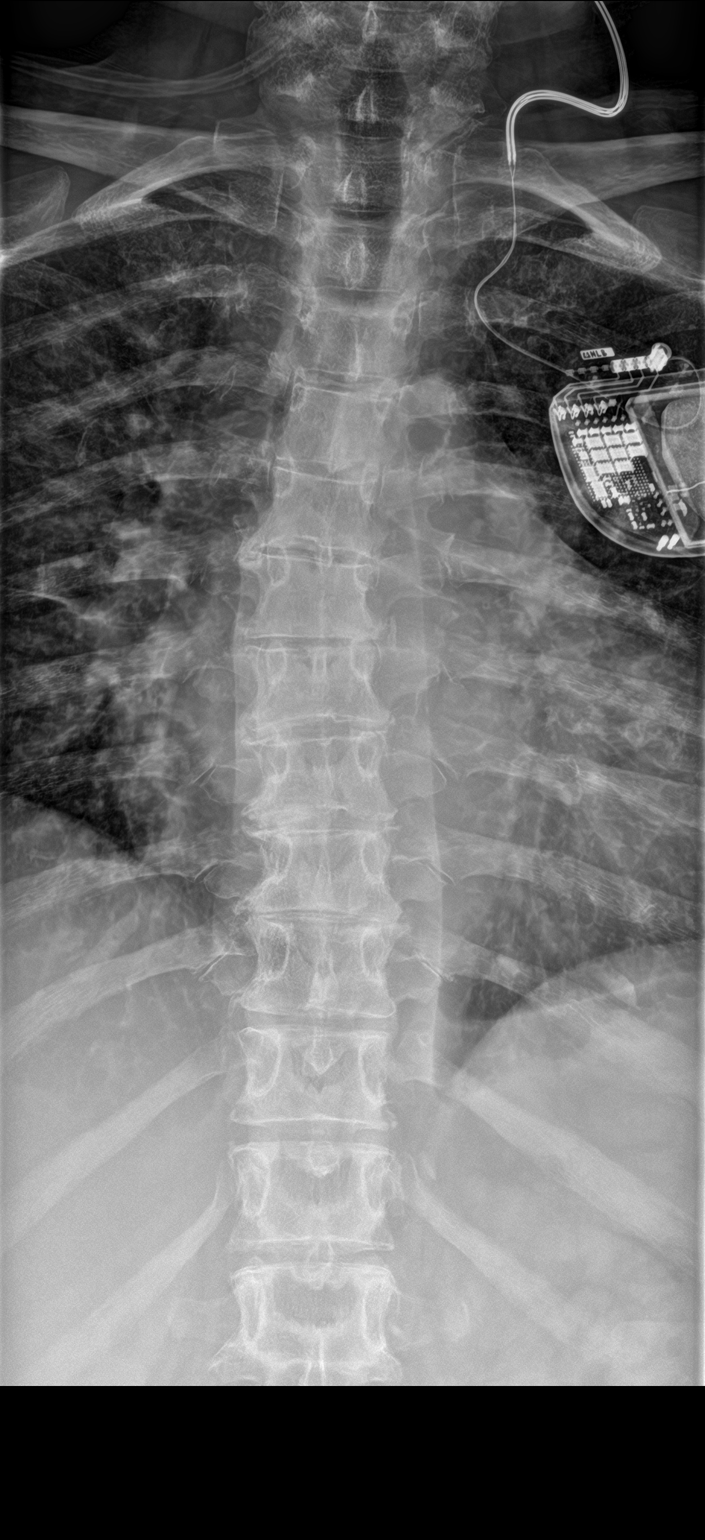

[t-spine lat]
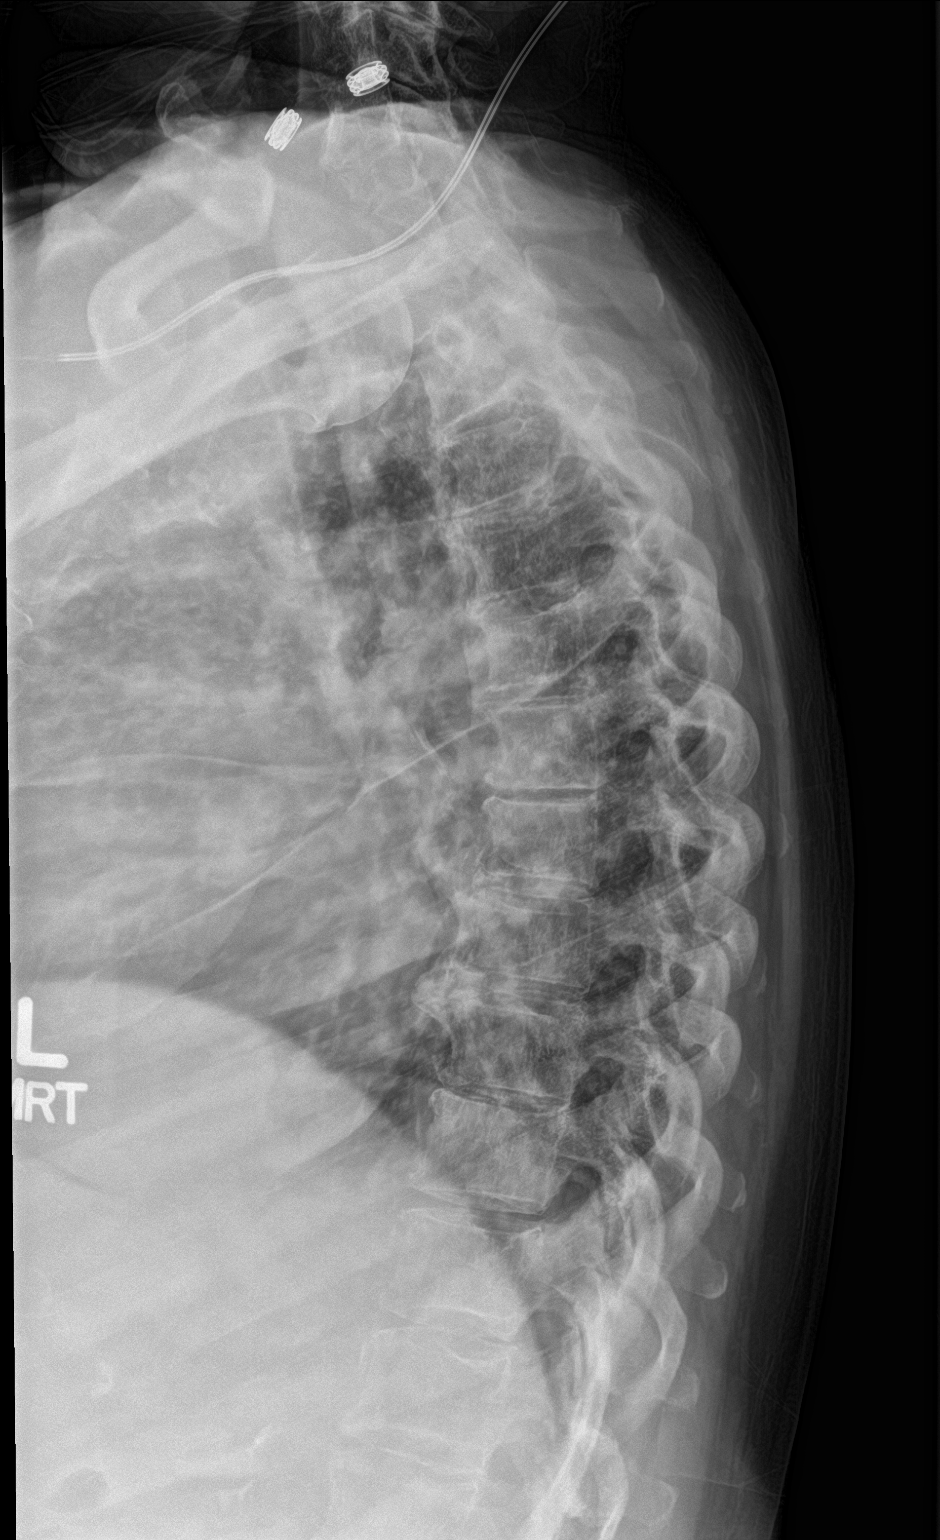

[2 of 2 positions shown; findings below may reference images not displayed]

FINDINGS: Negative for thoracic fracture. Disc degeneration and anterior
spurring in the thoracic spine. No bony lesion. Mild dextroscoliosis
mid thoracic spine.
IMPRESSION: Thoracic degenerative change.  Negative for fracture.

## 2018-04-03 IMAGING — CT CT HEAD W/O CM
4 series · 15 of 47 positions shown, 17 images · non-contrast
Comparison: CT scan of July 07, 2015.

CLINICAL DATA: Multiple falls.

EXAM:
CT HEAD WITHOUT CONTRAST
TECHNIQUE: Contiguous axial images were obtained from the base of the skull
through the vertex without intravenous contrast.

[Series 2: head without · axial · non-contrast · 0.44mm/px · z∈[-156,-36]mm · 7 of 34 slices shown, 9 images]
[im 5/34  brain]
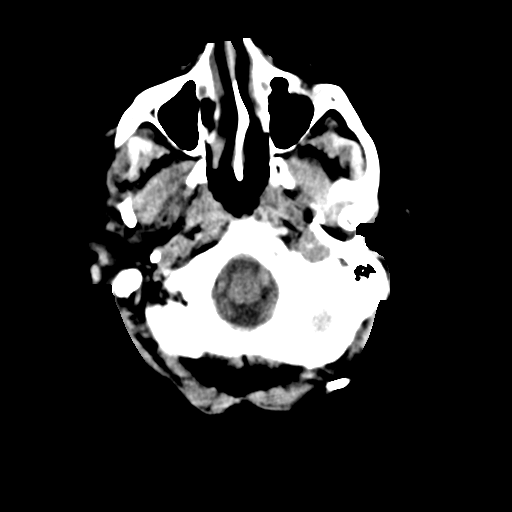
[im 5/34  bone]
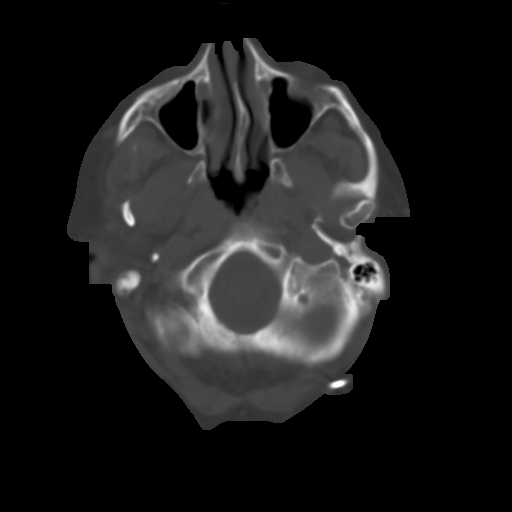
[im 9/34  brain]
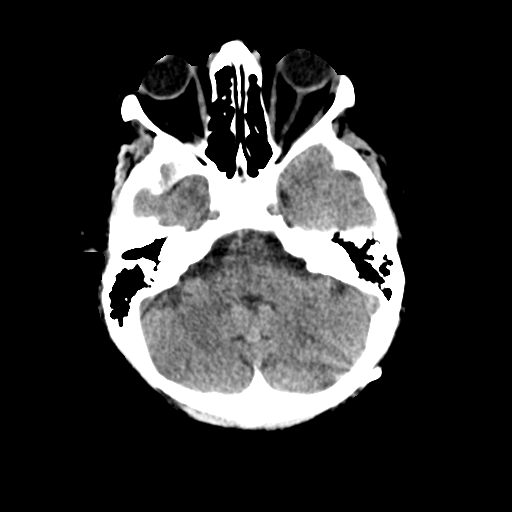
[im 13/34  brain]
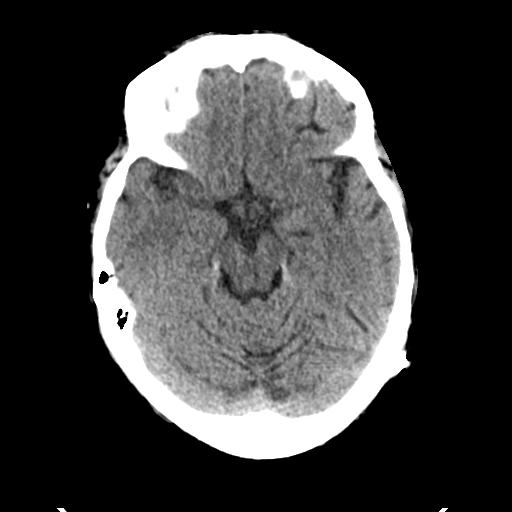
[im 17/34  brain]
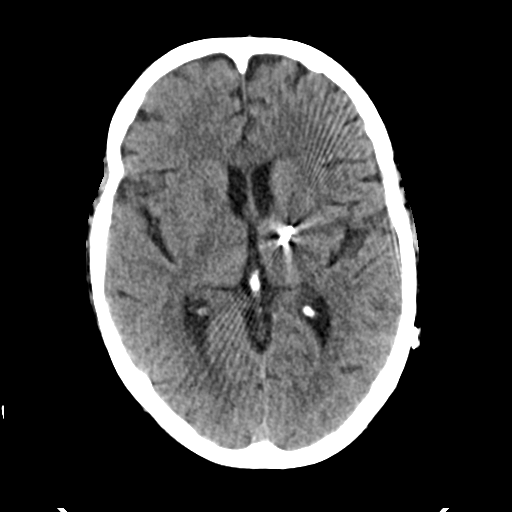
[im 21/34  brain]
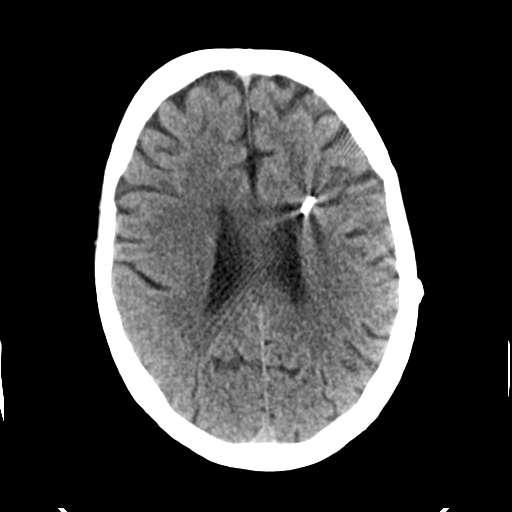
[im 21/34  bone]
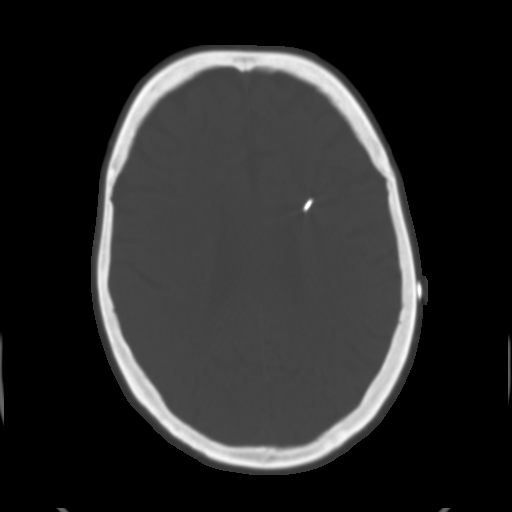
[im 25/34  brain]
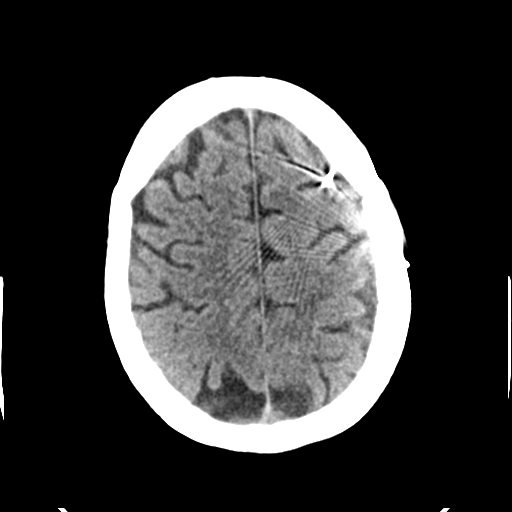
[im 29/34  brain]
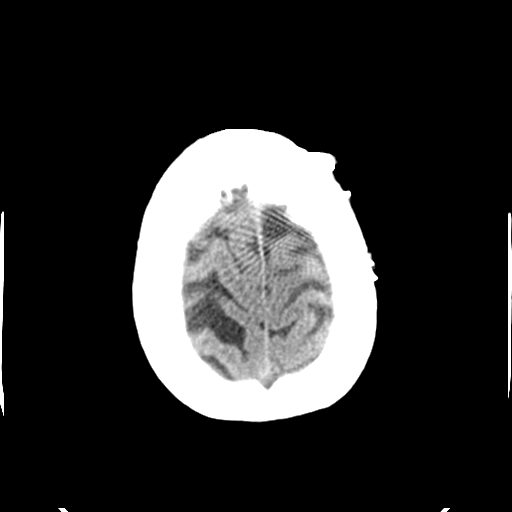

[Series 3: head bone · axial · 0.44mm/px · z∈[-160,-144]mm · 2 of 84 slices shown]
[im 9/84  bone]
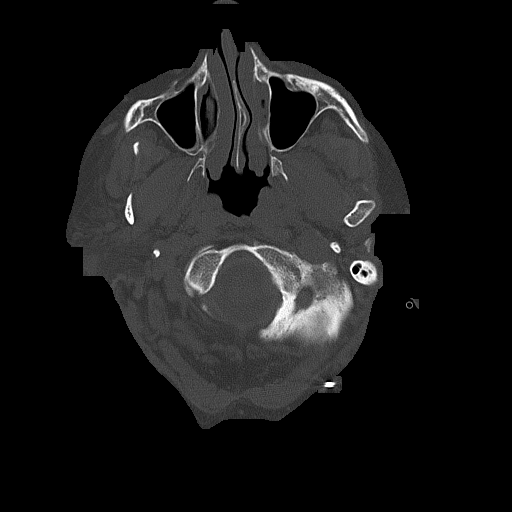
[im 17/84  bone]
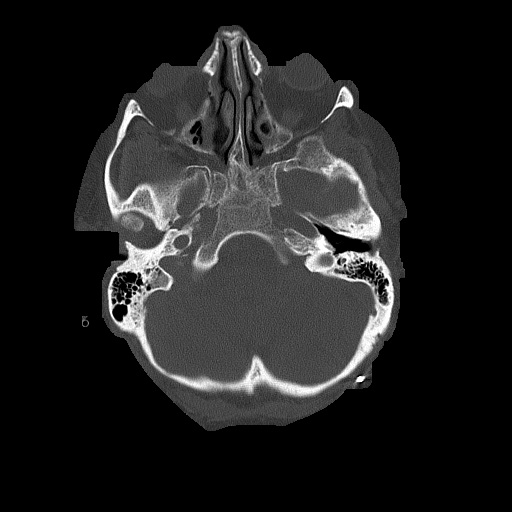

[Series 4: head without cor · coronal · non-contrast · 0.28mm/px · 3 of 72 slices shown]
[im 24/72  brain]
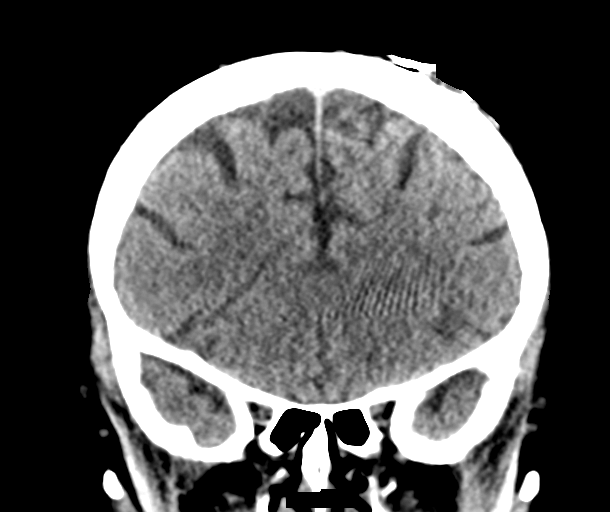
[im 32/72  brain]
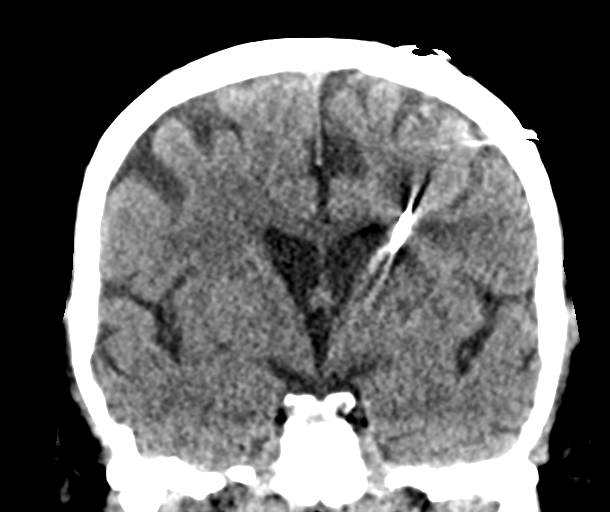
[im 40/72  brain]
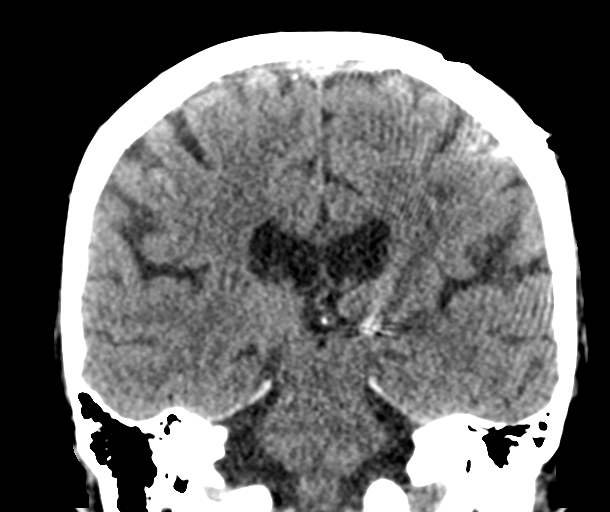

[Series 5: head without sag · sagittal · non-contrast · 0.27mm/px · 3 of 59 slices shown]
[im 20/59  brain]
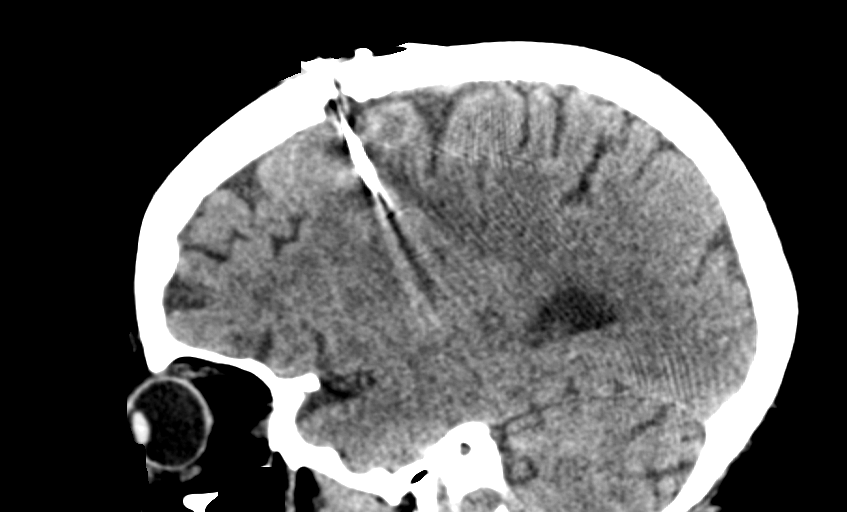
[im 30/59  brain]
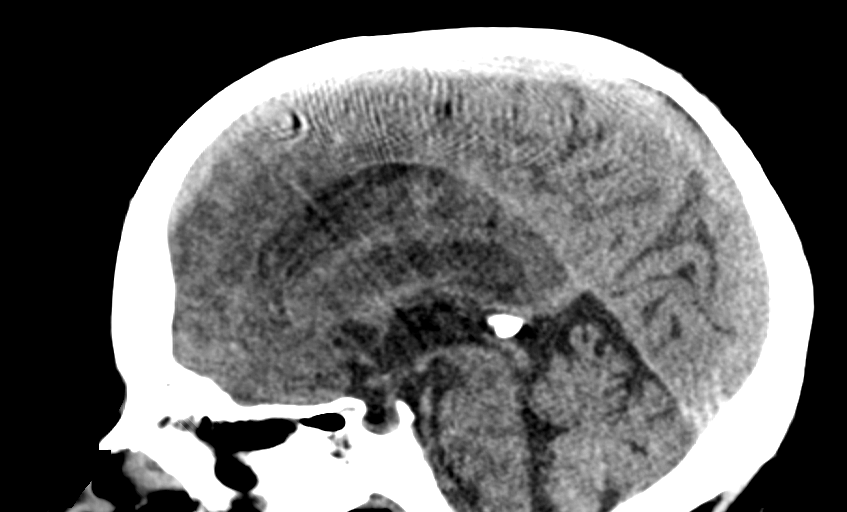
[im 39/59  brain]
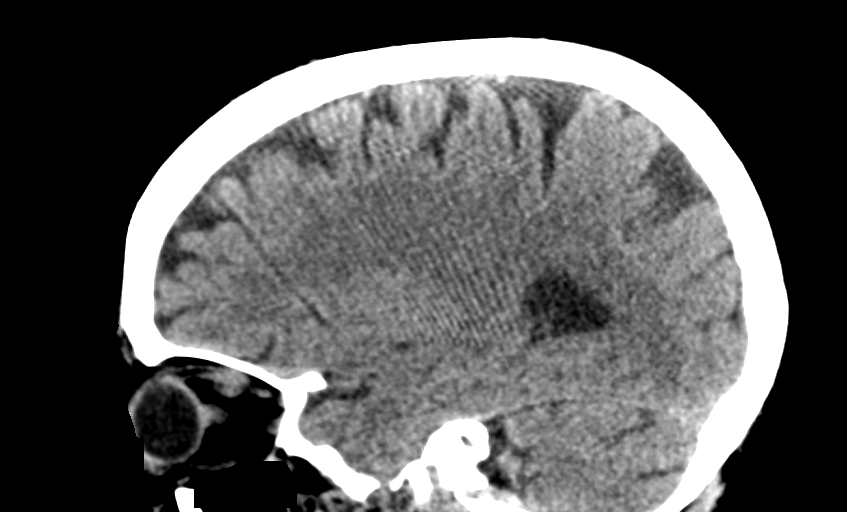

[15 of 47 positions shown; findings below may reference images not displayed]

FINDINGS: Bony calvarium appears intact. Stimulator is seen entering left
frontal skull with lead in left thalamus. This is unchanged compared
to prior exam. Mild diffuse cortical atrophy is noted. No mass
effect or midline shift is noted. Ventricular size is within normal
limits. There is no evidence of mass lesion, hemorrhage or acute
infarction.
IMPRESSION: Stable position of left-sided stimulator lead. Mild diffuse cortical
atrophy. No acute intracranial abnormality seen.

## 2018-06-03 IMAGING — CR DG CHEST 1V PORT
1 series · 1 of 1 positions shown · non-contrast
Comparison: 12/01/2015

CLINICAL DATA: Two falls today. Dizziness. Fever and nonproductive
cough.

EXAM:
PORTABLE CHEST 1 VIEW

[AP]
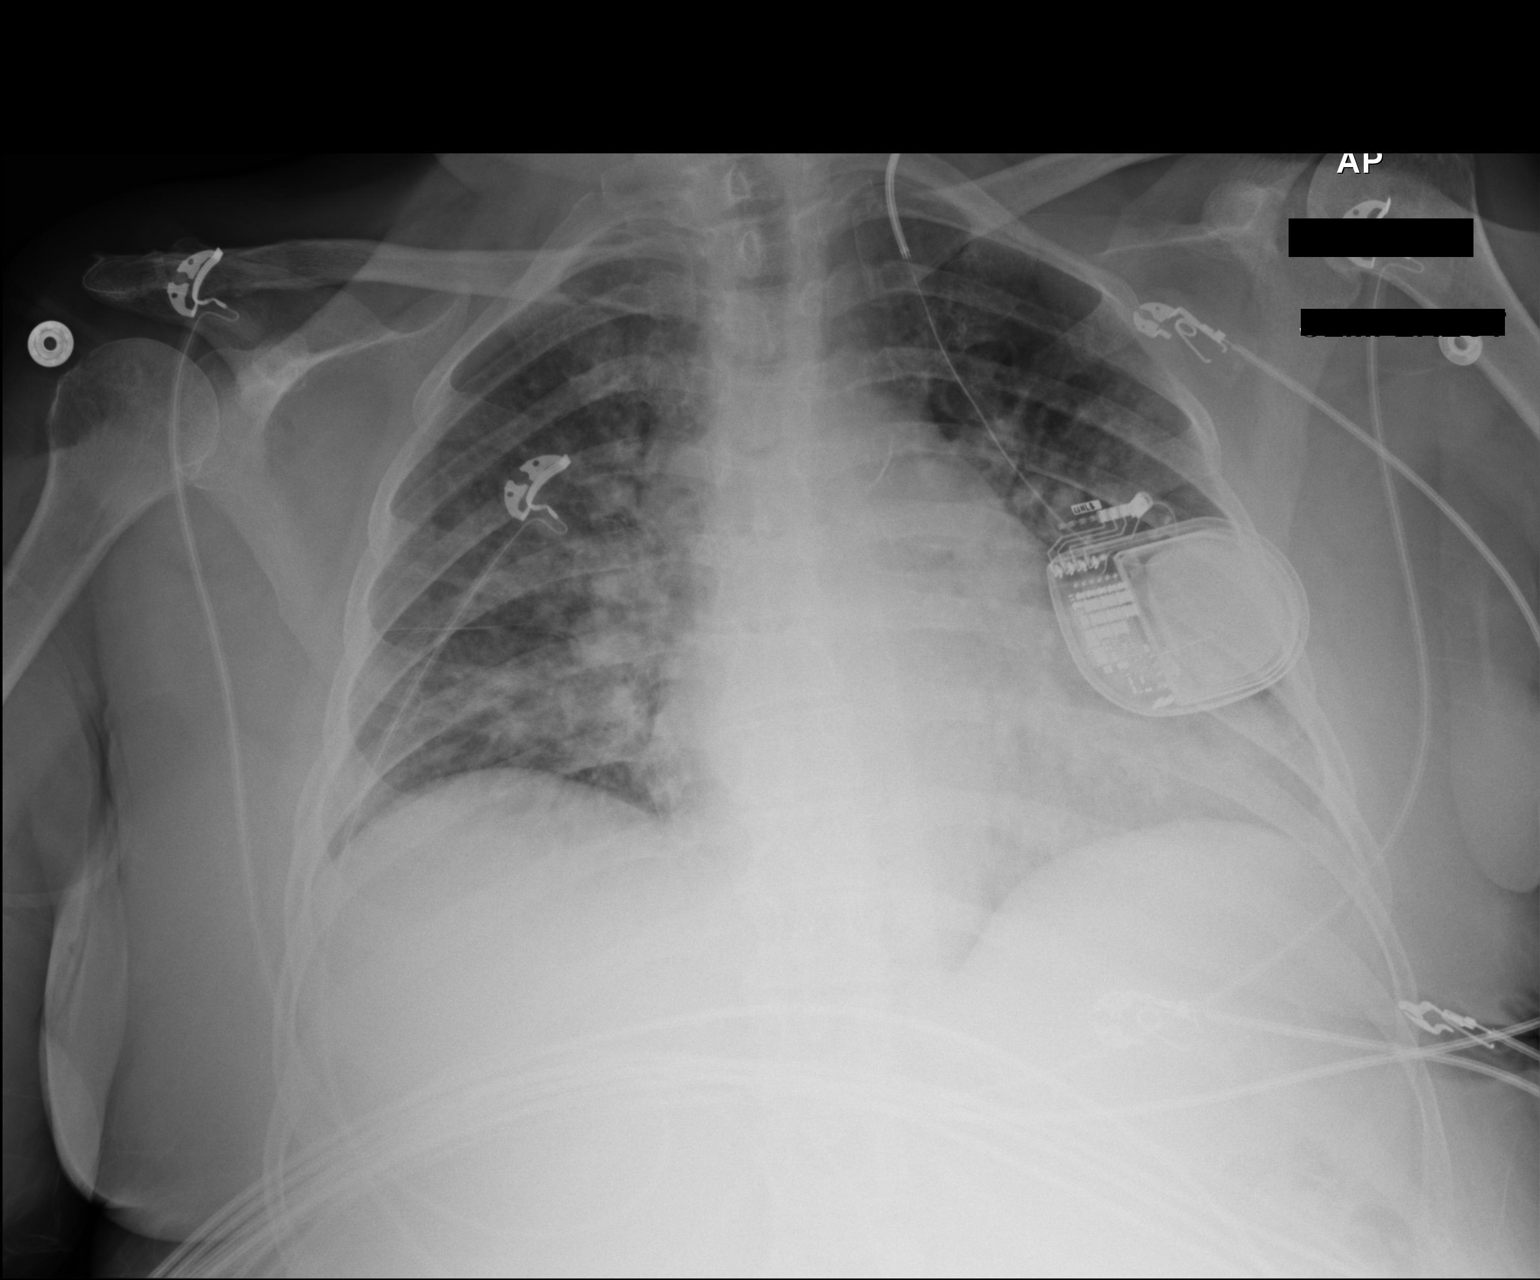

[1 of 1 positions shown; findings below may reference images not displayed]

FINDINGS: Generator pack with stimulator leads extending into the left jugular
region. Shallow inspiration. Heart size and pulmonary vascularity
are normal for technique. Vascular crowding. No focal consolidation
or airspace disease. No blunting of costophrenic angles. No
pneumothorax. Calcification of the aorta.
IMPRESSION: Shallow inspiration with vascular crowding. No evidence of active
pulmonary disease.
